# Patient Record
Sex: Female | Born: 1955
Health system: Southern US, Community
[De-identification: ages and names within clinical notes are randomized; demographics above are authoritative.]

## PROBLEM LIST (undated history)

## (undated) DIAGNOSIS — K760 Fatty (change of) liver, not elsewhere classified: Secondary | ICD-10-CM

## (undated) DIAGNOSIS — Z9981 Dependence on supplemental oxygen: Secondary | ICD-10-CM

## (undated) DIAGNOSIS — M797 Fibromyalgia: Secondary | ICD-10-CM

## (undated) DIAGNOSIS — E119 Type 2 diabetes mellitus without complications: Secondary | ICD-10-CM

## (undated) DIAGNOSIS — M1990 Unspecified osteoarthritis, unspecified site: Secondary | ICD-10-CM

## (undated) DIAGNOSIS — E039 Hypothyroidism, unspecified: Secondary | ICD-10-CM

## (undated) DIAGNOSIS — T8859XA Other complications of anesthesia, initial encounter: Secondary | ICD-10-CM

## (undated) DIAGNOSIS — I6521 Occlusion and stenosis of right carotid artery: Secondary | ICD-10-CM

## (undated) DIAGNOSIS — R0602 Shortness of breath: Secondary | ICD-10-CM

## (undated) DIAGNOSIS — J449 Chronic obstructive pulmonary disease, unspecified: Secondary | ICD-10-CM

## (undated) DIAGNOSIS — K219 Gastro-esophageal reflux disease without esophagitis: Secondary | ICD-10-CM

## (undated) DIAGNOSIS — I219 Acute myocardial infarction, unspecified: Secondary | ICD-10-CM

## (undated) DIAGNOSIS — Z8489 Family history of other specified conditions: Secondary | ICD-10-CM

## (undated) DIAGNOSIS — E785 Hyperlipidemia, unspecified: Secondary | ICD-10-CM

## (undated) DIAGNOSIS — I2510 Atherosclerotic heart disease of native coronary artery without angina pectoris: Secondary | ICD-10-CM

## (undated) DIAGNOSIS — J189 Pneumonia, unspecified organism: Secondary | ICD-10-CM

## (undated) DIAGNOSIS — H269 Unspecified cataract: Secondary | ICD-10-CM

## (undated) DIAGNOSIS — J45909 Unspecified asthma, uncomplicated: Secondary | ICD-10-CM

## (undated) DIAGNOSIS — S0300XA Dislocation of jaw, unspecified side, initial encounter: Secondary | ICD-10-CM

## (undated) DIAGNOSIS — T4145XA Adverse effect of unspecified anesthetic, initial encounter: Secondary | ICD-10-CM

## (undated) DIAGNOSIS — F419 Anxiety disorder, unspecified: Secondary | ICD-10-CM

## (undated) HISTORY — PX: BREAST SURGERY: SHX581

## (undated) HISTORY — PX: CHOLECYSTECTOMY: SHX55

## (undated) HISTORY — PX: TUBAL LIGATION: SHX77

## (undated) HISTORY — DX: Type 2 diabetes mellitus without complications: E11.9

## (undated) HISTORY — DX: Hypothyroidism, unspecified: E03.9

## (undated) HISTORY — DX: Gastro-esophageal reflux disease without esophagitis: K21.9

## (undated) HISTORY — PX: CARDIAC CATHETERIZATION: SHX172

## (undated) HISTORY — PX: ABDOMINAL HYSTERECTOMY: SUR658

## (undated) HISTORY — PX: DILATION AND CURETTAGE OF UTERUS: SHX78

---
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
## 2004-02-17 ENCOUNTER — Ambulatory Visit (HOSPITAL_COMMUNITY): Admission: RE | Admit: 2004-02-17 | Discharge: 2004-02-17 | Payer: Self-pay | Admitting: Family Medicine

## 2004-09-27 ENCOUNTER — Ambulatory Visit (HOSPITAL_COMMUNITY): Admission: RE | Admit: 2004-09-27 | Discharge: 2004-09-27 | Payer: Self-pay | Admitting: Family Medicine

## 2005-11-04 ENCOUNTER — Ambulatory Visit (HOSPITAL_COMMUNITY): Admission: RE | Admit: 2005-11-04 | Discharge: 2005-11-04 | Payer: Self-pay | Admitting: Family Medicine

## 2006-07-27 ENCOUNTER — Ambulatory Visit (HOSPITAL_COMMUNITY): Admission: RE | Admit: 2006-07-27 | Discharge: 2006-07-27 | Payer: Self-pay | Admitting: Family Medicine

## 2006-12-24 ENCOUNTER — Inpatient Hospital Stay (HOSPITAL_COMMUNITY): Admission: EM | Admit: 2006-12-24 | Discharge: 2006-12-25 | Payer: Self-pay | Admitting: Cardiovascular Disease

## 2007-10-14 ENCOUNTER — Emergency Department (HOSPITAL_COMMUNITY): Admission: EM | Admit: 2007-10-14 | Discharge: 2007-10-14 | Payer: Self-pay | Admitting: Emergency Medicine

## 2008-02-12 ENCOUNTER — Ambulatory Visit (HOSPITAL_COMMUNITY): Admission: RE | Admit: 2008-02-12 | Discharge: 2008-02-12 | Payer: Self-pay | Admitting: Family Medicine

## 2008-04-09 ENCOUNTER — Ambulatory Visit (HOSPITAL_COMMUNITY): Admission: RE | Admit: 2008-04-09 | Discharge: 2008-04-09 | Payer: Self-pay | Admitting: Family Medicine

## 2008-12-26 ENCOUNTER — Ambulatory Visit (HOSPITAL_COMMUNITY): Admission: RE | Admit: 2008-12-26 | Discharge: 2008-12-26 | Payer: Self-pay | Admitting: Family Medicine

## 2009-09-02 ENCOUNTER — Ambulatory Visit (HOSPITAL_COMMUNITY): Admission: RE | Admit: 2009-09-02 | Discharge: 2009-09-02 | Payer: Self-pay | Admitting: Ophthalmology

## 2009-10-01 ENCOUNTER — Ambulatory Visit (HOSPITAL_COMMUNITY): Admission: RE | Admit: 2009-10-01 | Discharge: 2009-10-01 | Payer: Self-pay | Admitting: Ophthalmology

## 2010-01-08 ENCOUNTER — Ambulatory Visit (HOSPITAL_COMMUNITY): Admission: RE | Admit: 2010-01-08 | Discharge: 2010-01-08 | Payer: Self-pay | Admitting: Family Medicine

## 2010-02-03 ENCOUNTER — Ambulatory Visit (HOSPITAL_COMMUNITY)
Admission: RE | Admit: 2010-02-03 | Discharge: 2010-02-03 | Payer: Self-pay | Source: Home / Self Care | Admitting: Obstetrics & Gynecology

## 2010-11-11 ENCOUNTER — Ambulatory Visit (HOSPITAL_COMMUNITY)
Admission: RE | Admit: 2010-11-11 | Discharge: 2010-11-11 | Payer: Self-pay | Source: Home / Self Care | Attending: Family Medicine | Admitting: Family Medicine

## 2010-11-11 ENCOUNTER — Encounter: Payer: Self-pay | Admitting: Orthopedic Surgery

## 2010-11-21 ENCOUNTER — Encounter: Payer: Self-pay | Admitting: Family Medicine

## 2010-11-24 ENCOUNTER — Encounter: Payer: Self-pay | Admitting: Orthopedic Surgery

## 2010-11-24 ENCOUNTER — Ambulatory Visit
Admission: RE | Admit: 2010-11-24 | Discharge: 2010-11-24 | Payer: Self-pay | Source: Home / Self Care | Attending: Orthopedic Surgery | Admitting: Orthopedic Surgery

## 2010-11-24 DIAGNOSIS — M722 Plantar fascial fibromatosis: Secondary | ICD-10-CM | POA: Insufficient documentation

## 2010-11-25 ENCOUNTER — Encounter: Payer: Self-pay | Admitting: Orthopedic Surgery

## 2010-12-02 NOTE — Medication Information (Signed)
Summary: Tax adviser   Imported By: Cammie Sickle 11/24/2010 20:53:44  _____________________________________________________________________  External Attachment:    Type:   Image     Comment:   External Document

## 2010-12-02 NOTE — Medication Information (Signed)
Summary: RX prior authorization required  RX prior authorization required   Imported By: Jacklynn Ganong 11/26/2010 12:50:01  _____________________________________________________________________  External Attachment:    Type:   Image     Comment:   External Document

## 2010-12-02 NOTE — Letter (Signed)
Summary: History form  History form   Imported By: Jacklynn Ganong 11/26/2010 13:17:45  _____________________________________________________________________  External Attachment:    Type:   Image     Comment:   External Document

## 2010-12-02 NOTE — Assessment & Plan Note (Signed)
Summary: EVAL/TREAT RT HEEL PAIN/XR [facility]/REF [facility]/[place]   Vital Signs:  Patient profile:   55 year old female Height:      68 inches Weight:      207 pounds Pulse rate:   76 / minute Resp:     16 per minute  Vitals Entered By: Fuller Canada MD (November 24, 2010 10:19 AM)  Visit Type:  new patient Referring Provider:  Dr. Renard Matter Primary Provider:  Dr. Renard Matter  CC:  right foot pain.  History of Present Illness: I saw Tabitha Schmidt in the office today for an initial visit.  She is a 55 years old woman with the complaint of:  right foot pain.  No injury.  Xrays [facility] 11/11/10 right foot.  Meds: Nexium, Thyroid med, Metformin, Celebrex, ASA.   She initially complained of plantar heel pain with pain getting out of a chair or standing up in the morning. However, she was treated with Celebrex and her pain has all but stopped.   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 Cardiovascular:  Denies chest pain, palpitations, fainting, and murmurs. Respiratory:  Complains of snoring; denies short of breath, wheezing, couch, tightness, pain on inspiration, and snoring . Gastrointestinal:  Complains of heartburn; denies nausea, vomiting, diarrhea, constipation, and blood in your stools. Genitourinary:  Denies frequency, urgency, difficulty urinating, painful urination, flank pain, and bleeding in urine. Neurologic:  Denies numbness, tingling, unsteady gait, dizziness, tremors,  and seizure. Musculoskeletal:  Complains of stiffness and muscle pain; denies joint pain, swelling, instability, redness, and heat. Endocrine:  Denies excessive thirst, exessive urination, and heat or cold intolerance. Psychiatric:  Denies nervousness, depression, anxiety, and hallucinations. Skin:  Denies changes in the skin, poor healing, rash, itching, and redness. HEENT:  Denies blurred or double vision, eye pain, redness, and watering. Immunology:  Complains of seasonal allergies; denies sinus problems and allergic to bee stings. Hemoatologic:  Denies easy bleeding and brusing.  Physical Exam  Additional Exam:   * VS reviewed and were normal   *GEN: appearance was normal   ** CDV: normal pulses temperature and no edema  * LYMPH nodes were normal   * SKIN was normal   * Neuro: normal sensation ** Psyche: AAO x 3 and mood was normal   MSK *Gait was normal  *Inspection of the RIGHT foot. There is no tenderness in the plantar aspect of the heel or the Achilles. Her range of motion remained normal. Gen. plantarflexion strength and ankle is stable    Impression & Recommendations:  Problem # 1:  PLANTAR FACIITIS (ICD-728.71) Assessment New  The x-rays were done at [facility]. The report and the films have been reviewed. very small plantar spur larger Achilles, calcification.   Orders: New Patient Level II (16109)  Medications Added to Medication List This Visit: 1)  Celebrex 200 Mg Caps (Celecoxib) .Marland Kitchen.. 1 q day  Patient Instructions: 1)  Continue Celebrex  2)  Please schedule a follow-up appointment as needed. Prescriptions: CELEBREX 200 MG CAPS (CELECOXIB) 1 q day  #30 x 1  Entered and Authorized by:   Fuller Canada MD   Signed by:   Fuller Canada MD on 11/24/2010   Method used:   Handwritten   RxID:   [id number]    Orders Added: 1)  New Patient Level II [14782]

## 2011-01-19 LAB — COMPREHENSIVE METABOLIC PANEL
ALT: 55 U/L — ABNORMAL HIGH (ref 0–35)
AST: 40 U/L — ABNORMAL HIGH (ref 0–37)
Albumin: 4.4 g/dL (ref 3.5–5.2)
Alkaline Phosphatase: 79 U/L (ref 39–117)
BUN: 11 mg/dL (ref 6–23)
CO2: 24 mEq/L (ref 19–32)
Calcium: 9.8 mg/dL (ref 8.4–10.5)
Chloride: 106 mEq/L (ref 96–112)
Creatinine, Ser: 0.76 mg/dL (ref 0.4–1.2)
GFR calc Af Amer: >60 mL/min (ref >60)
GFR calc non Af Amer: >60 mL/min (ref >60)
Glucose, Bld: 145 mg/dL — ABNORMAL HIGH (ref 70–99)
Potassium: 3.4 mEq/L — ABNORMAL LOW (ref 3.5–5.1)
Sodium: 138 mEq/L (ref 135–145)
Total Bilirubin: 0.8 mg/dL (ref 0.3–1.2)
Total Protein: 7.3 g/dL (ref 6.0–8.3)

## 2011-01-19 LAB — CBC
HCT: 41.7 % (ref 36.0–46.0)
Hemoglobin: 14.6 g/dL (ref 12.0–15.0)
MCHC: 35 g/dL (ref 30.0–36.0)
MCV: 91 fL (ref 78.0–100.0)
Platelets: 250 10*3/uL (ref 150–400)
RBC: 4.58 MIL/uL (ref 3.87–5.11)
RDW: 12.7 % (ref 11.5–15.5)
WBC: 5.1 10*3/uL (ref 4.0–10.5)

## 2011-01-19 LAB — URINALYSIS, ROUTINE W REFLEX MICROSCOPIC
Bilirubin Urine: NEGATIVE
Glucose, UA: NEGATIVE mg/dL
Hgb urine dipstick: NEGATIVE
Ketones, ur: NEGATIVE mg/dL
Nitrite: NEGATIVE
Protein, ur: NEGATIVE mg/dL
Specific Gravity, Urine: 1.005 (ref 1.005–1.030)
Urobilinogen, UA: 0.2 mg/dL (ref 0.0–1.0)
pH: 5.5 (ref 5.0–8.0)

## 2011-02-02 LAB — GLUCOSE, CAPILLARY: Glucose-Capillary: 91 mg/dL (ref 70–99)

## 2011-02-03 LAB — BASIC METABOLIC PANEL WITH GFR
BUN: 10 mg/dL (ref 6–23)
CO2: 29 meq/L (ref 19–32)
Calcium: 9.7 mg/dL (ref 8.4–10.5)
Chloride: 101 meq/L (ref 96–112)
Creatinine, Ser: 0.76 mg/dL (ref 0.4–1.2)
GFR calc Af Amer: >60 mL/min (ref >60)
GFR calc non Af Amer: >60 mL/min (ref >60)
Glucose, Bld: 152 mg/dL — ABNORMAL HIGH (ref 70–99)
Potassium: 3.7 meq/L (ref 3.5–5.1)
Sodium: 138 meq/L (ref 135–145)

## 2011-02-03 LAB — HEMOGLOBIN AND HEMATOCRIT, BLOOD
HCT: 42 % (ref 36.0–46.0)
Hemoglobin: 14.4 g/dL (ref 12.0–15.0)

## 2011-03-17 ENCOUNTER — Ambulatory Visit (INDEPENDENT_AMBULATORY_CARE_PROVIDER_SITE_OTHER): Payer: Medicaid Other | Admitting: Orthopedic Surgery

## 2011-03-17 DIAGNOSIS — M722 Plantar fascial fibromatosis: Secondary | ICD-10-CM

## 2011-03-17 MED ORDER — TRAMADOL-ACETAMINOPHEN 37.5-325 MG PO TABS: 1.0000 | ORAL_TABLET | ORAL | Status: DC | PRN

## 2011-03-17 MED ORDER — METHYLPREDNISOLONE ACETATE 40 MG/ML IJ SUSP: 40.0000 mg | Freq: Once | INTRAMUSCULAR | Status: DC

## 2011-03-17 MED ORDER — DICLOFENAC SODIUM 75 MG PO TBEC: 75.0000 mg | DELAYED_RELEASE_TABLET | Freq: Two times a day (BID) | ORAL | Status: DC

## 2011-03-17 NOTE — Patient Instructions (Addendum)
The patient is advised to apply ice or cold packs intermittently as needed to relieve pain.   You have received a steroid shot. 15% of patients experience increased pain at the injection site with in the next 24 hours. This is best treated with ice and tylenol extra strength 2 tabs every 8 hours. If you are still having pain please call the office.

## 2011-03-18 ENCOUNTER — Encounter: Payer: Self-pay | Admitting: Orthopedic Surgery

## 2011-03-18 NOTE — Progress Notes (Signed)
History reviewed. No pertinent family history. cancer, DM, CAD, Asthma  Previously seen for problems with her foot now presents back with continued problems in the plantar fascia associated with early step pain for start pain pain unrelieved by routine measures of anti-inflammatory ice and stretchingHer previous chart has been reviewed in the past medical social and surgical histories have been reviewed and updated her no changes  General: The patient is normally developed, with normal grooming and hygiene. There are no gross deformities. The body habitus is normal   CDV: The pulse and perfusion of the extremities are normal   LYMPH: There is no gross lymphadenopathy in the extremities    Skin: There are no rashes, ulcers or cafe-au-lait spot   Psyche: The patient is alert, awake and oriented.  Mood is normal   Neuro:  The coordination and balance are normal.  Sensation is normal. Reflexes are 2+ and equal   Musculoskeletal  Pain at the center aspect of the plantar fascia insertion at the calcaneus.  There is no tenderness in the Achilles tendon, no nodularity.  Range of motion in the ankle joint is normal.  Mid fascial areas are nontender.  There is metatarsal head tenderness  Recommend injection into the plantar fascia, start night splinting, physical therapy and continue anti-inflammatory medication.  I made sure  the patient is aware that this will take several months to get better.

## 2011-03-18 NOTE — Cardiovascular Report (Signed)
NAME:  ALANEY, WITTER NO.:  [id number]   MEDICAL RECORD NO.:  [id number]          PATIENT TYPE:  INP   LOCATION:  2854                         FACILITY:  [facility]   PHYSICIAN:  Nanetta Batty, M.D.   DATE OF BIRTH:  12-Aug-1956   DATE OF PROCEDURE:  DATE OF DISCHARGE:                            CARDIAC CATHETERIZATION   Ms. Owen the 55 year old married white female, transferred from [facility] because of chest pain.  She has a positive family history  for heart disease and smokes one pack per day, cigarettes.  She arrived  for a myocardial infarction.  She presents now for diagnostic coronary  arteriography to define her anatomy, rule out ischemic etiology.   DESCRIPTION OF PROCEDURE:  The patient brought to the second floor [facility]  cardiac cath lab in a post-absorptive state.  She is pre-medicated with  p.o. Valium.  The right groin was prepped and shaved the usual sterile fashion, one  percent Xylocaine was used with local anesthesia.  A 6-French sheath was  inserted into the right femoral artery using standard Seldinger  technique.  A 6-French right and left Judkins diagnostic catheter, as  well as a 6-French pigtail catheter were used for selective coronary  angiography, left ventriculography, distal abdominal aortography and  selective left renal artery angiography.  Visipaque dye was used to the  entirety of the case.  Retrograde aortic, left ventricular back blood  pressures were recorded.   HEMODYNAMIC RESULTS:  1. Aortic systolic pressure 127, diastolic pressure 64.  2. Left ventricular systolic pressure 127, end-diastolic pressure 14.   SELECTIVE CORONARY ANGIOGRAPHY:  1. Left main normal.  2. LAD:  LAD gave off a large first diagonal branch which had a 60%      segmental proximal stenosis.  3. Left circumflex; non-dominant and free of significant disease.  4. Right coronary artery; dominant and free of significant disease.  5. Left  ventriculography; RAO left ventriculogram performed using 25      mL of Visipaque dye at 12 mL per second.  The overall LVEF was      estimated greater than 60% without focal wall motion abnormalities.  6. Distal abdominal aortography; performed using 20 mL of Visipaque      dye at 20 mL per second.  There appear to be a ostial left renal      artery stenosis.  Infrarenal abdominal aorta and iliac bifurcation      appear free of significant atherosclerotic changes.   IMPRESSION:  Ms. Michaelis has branch vessel disease in a large diagonal  branch with normal LV function.  While this may be a culprit, she has  only had one episode of chest pain.  I prefer to treat her medically at  this time with risk factor modification, anti-anginal lesions.  Will  obtain an outpatient Myoview stress test at our office, after which time  I will see her back in followup.   Sheath was removed and pressure was held to the groin, to achieve  hemostasis.  The patient left the lab in stable condition.  Nanetta Batty, M.D.  Electronically Signed     JB/MEDQ  D:  12/25/2006  T:  12/25/2006  Job:  [id number]   cc:   Patient Chart  [facility] -2nd Floor  [facility]  [facility]

## 2011-03-18 NOTE — Discharge Summary (Signed)
Tabitha Schmidt, Tabitha Schmidt                 ACCOUNT NO.:  [id number]   MEDICAL RECORD NO.:  [id number]          PATIENT TYPE:  INP   LOCATION:  2854                         FACILITY:  [facility]   PHYSICIAN:  Nanetta Batty, M.D.   DATE OF BIRTH:  1955/12/21   DATE OF ADMISSION:  12/24/2006  DATE OF DISCHARGE:  12/25/2006                               DISCHARGE SUMMARY   DISCHARGE DIAGNOSES:  1. Unstable angina.  2. Coronary disease with a 60% diagonal at catheterization this      admission to be treated medically.  3. History of severe gastroesophageal reflux.  4. A 60% left renal artery stenosis with a 25-mm gradient at      catheterization this admission.  5. History of smoking.  6. Hypothyroidism, treated.  7. Dyslipidemia with an LDL of 133.   HOSPITAL COURSE:  The patient is a 55 year old female who was  transferred by [facility] with unstable angina.  She had  midsternal chest pain that radiated to her neck.  Initially, she thought  it was from reflux.  Nitroglycerin seemed to help.  The patient is on  b.i.d. Nexium at home already.  The patient was transferred to [facility] for further evaluation.  Enzymes were negative.  She was  put on heparin and nitrates.  Her Nexium was continued.  A D-dimer was  within normal limits.   She had catheterization done December 25, 2006, by Dr. Allyson Sabal, which  revealed normal RCA, normal left main, normal circumflex, normal OM,  normal LAD, and a 60% narrowing in the diagonal.  The iliac arteries  were normal, the left renal artery had a 60% narrowing with a 25-mm  gradient.  LV function was normal.   PLAN:  For outpatient Myoview and renal artery Doppler study and then a  followup with Dr. Allyson Sabal.   We did add simvastatin 40 mg and a baby aspirin to her medications.   In interviewing her further at discharge, it turns out she has a long  history of fairly severe gastroesophageal reflux disease.  Her symptoms  may have been from  that.   She will follow up with Dr. Karilyn Cota and Dr. Megan Mans.   LABORATORY:  INR is 1.  A chest x-ray showed no active disease.  D-dimer  is 0.45.  Troponin negative x3.  Cholesterol is 204 with an LDL of 133,  HDL 38, triglycerides 163.  Sodium 134, potassium 3.4, BUN 8, creatinine  0.6, white count 6, hemoglobin 14.4, hematocrit 41.9 platelets 235.  EKG  reveals sinus rhythm without acute changes.   DISPOSITION:  The patient is discharged in stable condition and will  follow up with Dr. Allyson Sabal in [place] after her Myoview and renal  artery Doppler studies.      Abelino Derrick, P.A.      Nanetta Batty, M.D.  Electronically Signed    LKK/MEDQ  D:  12/25/2006  T:  12/25/2006  Job:  [id number]   cc:   Angus G. Renard Matter, MD  Lionel December, M.D.

## 2011-04-14 ENCOUNTER — Ambulatory Visit (HOSPITAL_COMMUNITY): Payer: Medicaid Other | Admitting: Physical Therapy

## 2011-05-26 ENCOUNTER — Ambulatory Visit (INDEPENDENT_AMBULATORY_CARE_PROVIDER_SITE_OTHER): Payer: Medicaid Other | Admitting: Orthopedic Surgery

## 2011-05-26 ENCOUNTER — Encounter: Payer: Self-pay | Admitting: Orthopedic Surgery

## 2011-05-26 DIAGNOSIS — M722 Plantar fascial fibromatosis: Secondary | ICD-10-CM

## 2011-05-26 MED ORDER — ACETAMINOPHEN-CODEINE 300-30 MG PO TABS: 1.0000 | ORAL_TABLET | Freq: Four times a day (QID) | ORAL | Status: DC | PRN

## 2011-05-26 NOTE — Progress Notes (Signed)
   Followup visit  55 year old female with history of plantar fasciitis of the RIGHT foot presents with continued pain in the RIGHT foot.  She did not tolerate tramadol.  She is currently on diclofenac.  Previous treatment includes physical therapy, home exercise program, night splint, injection.  She is undergoing treatment for greater than 6 months and had pain for greater than one year in the plantar aspect of the foot.  She continues to complain of startup pain and pain getting out of bed in the morning.  Exam reveals a well-developed well-nourished female grooming and hygiene are intact she walks with a normal heel-to-toe gait pattern.  She has tenderness in the plantar aspect of the foot, in the calcaneus, and in the Achilles tendon.  Her range of motion is normal her strength is good her ankle is stable she has normal neurovascular exam  Impression plain radiographs taken in the past were normal  Plantar fasciitis rule out Achilles tendinitis and rule out stress fracture calcaneus  We ordered a Cam Walker for her.  When we get that we will place that for her as well.  She is to start Tylenol with Codeine for pain and we will do an MRI of her foot.

## 2011-05-26 NOTE — Patient Instructions (Signed)
Continue diclofenac Try codeine for pain as well if needed   Continue exercises and night splint   We will order the brace for $40  Order mri foot

## 2011-05-30 ENCOUNTER — Telehealth: Payer: Self-pay | Admitting: Radiology

## 2011-05-30 NOTE — Telephone Encounter (Signed)
error 

## 2011-06-06 ENCOUNTER — Telehealth: Payer: Self-pay | Admitting: Radiology

## 2011-06-06 NOTE — Telephone Encounter (Signed)
I called to give the patient her MRI appointment at [facility] on 06-07-11 at 9:30. Patient has Medicaid, authorization # [id number] and it expires on 06-29-11. She will follow up back here at our office for results.

## 2011-06-07 ENCOUNTER — Ambulatory Visit (HOSPITAL_COMMUNITY)
Admission: RE | Admit: 2011-06-07 | Discharge: 2011-06-07 | Disposition: A | Discharge status: Home / Self Care | Payer: Medicaid Other | Source: Ambulatory Visit | Attending: Orthopedic Surgery | Admitting: Orthopedic Surgery

## 2011-06-07 DIAGNOSIS — M25579 Pain in unspecified ankle and joints of unspecified foot: Secondary | ICD-10-CM | POA: Insufficient documentation

## 2011-06-07 DIAGNOSIS — M899 Disorder of bone, unspecified: Secondary | ICD-10-CM | POA: Insufficient documentation

## 2011-06-07 DIAGNOSIS — M722 Plantar fascial fibromatosis: Secondary | ICD-10-CM

## 2011-06-13 ENCOUNTER — Telehealth: Payer: Self-pay | Admitting: Orthopedic Surgery

## 2011-06-13 NOTE — Telephone Encounter (Signed)
Gave patient results she will continue with nonoperative care

## 2011-06-13 NOTE — Telephone Encounter (Signed)
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

## 2011-06-13 NOTE — Telephone Encounter (Signed)
Patient called, needs MRI results.  Phone # [phone].

## 2012-01-03 ENCOUNTER — Ambulatory Visit (INDEPENDENT_AMBULATORY_CARE_PROVIDER_SITE_OTHER): Payer: Medicaid Other | Admitting: Internal Medicine

## 2012-01-03 ENCOUNTER — Other Ambulatory Visit (INDEPENDENT_AMBULATORY_CARE_PROVIDER_SITE_OTHER): Payer: Self-pay | Admitting: *Deleted

## 2012-01-03 ENCOUNTER — Encounter (INDEPENDENT_AMBULATORY_CARE_PROVIDER_SITE_OTHER): Payer: Self-pay | Admitting: Internal Medicine

## 2012-01-03 ENCOUNTER — Encounter (INDEPENDENT_AMBULATORY_CARE_PROVIDER_SITE_OTHER): Payer: Self-pay | Admitting: *Deleted

## 2012-01-03 DIAGNOSIS — K219 Gastro-esophageal reflux disease without esophagitis: Secondary | ICD-10-CM

## 2012-01-03 DIAGNOSIS — E039 Hypothyroidism, unspecified: Secondary | ICD-10-CM

## 2012-01-03 DIAGNOSIS — E119 Type 2 diabetes mellitus without complications: Secondary | ICD-10-CM

## 2012-01-03 NOTE — Patient Instructions (Signed)
EGD

## 2012-01-03 NOTE — Progress Notes (Signed)
Subjective:     Patient ID: Tabitha Schmidt, female   DOB: 17-Aug-1956, 56 y.o.   MRN: [id number]  HPI Tabitha Schmidt is a 56 yr old female referred to our office by Dr. Renard Matter for GERD. She is avoiding greasy and fried foods. At night, her acid reflux burns her esophagus.  She bumped her Nexium up to twice a day with some relief.  She follows a GERD diet. She wakes frequently during the night due to t he acid reflux.  She feels like she is drowning. She feels like she has aspirated some of the acid reflux. She is coughing during the night.  She has frequent burning in her esophagus.  She started Carafate yesterday and it worked for about an hour. She also tells me she has wheezing and SOB, and cough.  Her appetite is good. No weight loss. She has a hx of GERD but symptoms have been worse in the last 3 weeks. No abdominal pain. She does have burning in her esophagus.  She has a BM every day. No melena or bright red rectal bleeding.  She has never undergone a creening colonoscopy.     Review of Systems see hpi     Current Outpatient Prescriptions  Medication Sig Dispense Refill  . Acetaminophen-Codeine (TYLENOL/CODEINE #3) 300-30 MG per tablet Take 1 tablet by mouth every 6 (six) hours as needed for pain.  42 tablet  0  . aspirin 81 MG EC tablet Take 81 mg by mouth as needed.       Marland Kitchen esomeprazole (NEXIUM) 40 MG capsule Take 40 mg by mouth 2 (two) times daily before a meal.       . estradiol (CLIMARA - DOSED IN MG/24 HR) 0.05 mg/24hr Place 1 patch onto the skin once a week.        . metFORMIN (GLUCOPHAGE) 500 MG tablet Take 1,000 mg by mouth 2 (two) times daily.       Marland Kitchen thyroid (ARMOUR) 90 MG tablet Take 90 mg by mouth daily.         Current Facility-Administered Medications  Medication Dose Route Frequency Provider Last Rate Last Dose  . DISCONTD: methylPREDNISolone acetate (DEPO-MEDROL) injection 40 mg  40 mg Intra-articular Once Fuller Canada, MD       Past Medical History  Diagnosis Date  .  Hypothyroid   . DM (diabetes mellitus)   . GERD (gastroesophageal reflux disease)    History   Social History  . Marital Status: Single    Spouse Name: N/A    Number of Children: N/A  . Years of Education: N/A   Occupational History  . Not on file.   Social History Main Topics  . Smoking status: Former Games developer  . Smokeless tobacco: Not on file   Comment: quit 5 yrs ago  . Alcohol Use: No  . Drug Use: No  . Sexually Active: Not on file   Other Topics Concern  . Not on file   Social History Narrative  . No narrative on file   Family Status  Relation Status Death Age  . Mother Deceased     Breast cancer  . Father Deceased     CAD  . Sister Alive     good health  . Brother Other     One deceased from ETOH, Two are alcoholics   No Known Allergies  Objective:   Physical Exam Filed Vitals:   01/03/12 1004  Height: 5\' 8"  (1.727 m)  Weight: 217 lb 6.4  oz (98.612 kg)  Alert and oriented. Skin warm and dry. Oral mucosa is moist.   . Sclera anicteric, conjunctivae is pink. Thyroid not enlarged. No cervical lymphadenopathy. Lungs clear. Heart regular rate and rhythm.  Abdomen is soft. Bowel sounds are positive. No hepatomegaly. No abdominal masses felt. No tenderness.  No edema to lower extremities. Patient is alert and oriented.       Assessment:    GERD uncontrolled at this time.  Esophagitis needs to be ruled out also.  In needs of a screening colonoscopy.    Plan:   EGD with Dr. Karilyn Cota. The risks and benefits such as perforation, bleeding, and infection were reviewed with the patient and is agreeable.     Patient has declined a colonoscopy

## 2012-01-17 ENCOUNTER — Encounter (HOSPITAL_COMMUNITY): Payer: Self-pay | Admitting: Pharmacy Technician

## 2012-01-25 ENCOUNTER — Other Ambulatory Visit: Payer: Self-pay | Admitting: *Deleted

## 2012-01-25 MED ORDER — DICLOFENAC-MISOPROSTOL 75-0.2 MG PO TABS: 1.0000 | ORAL_TABLET | Freq: Two times a day (BID) | ORAL | Status: AC

## 2012-01-25 MED ORDER — SODIUM CHLORIDE 0.45 % IV SOLN
Freq: Once | INTRAVENOUS | Status: AC
  Administered 2012-01-26: 08:00:00 via INTRAVENOUS

## 2012-01-26 ENCOUNTER — Ambulatory Visit (HOSPITAL_COMMUNITY)
Admission: RE | Admit: 2012-01-26 | Discharge: 2012-01-26 | Disposition: A | Discharge status: Home Health | Payer: Medicaid Other | Source: Ambulatory Visit | Attending: Internal Medicine | Admitting: Internal Medicine

## 2012-01-26 ENCOUNTER — Encounter (HOSPITAL_COMMUNITY): Payer: Self-pay

## 2012-01-26 ENCOUNTER — Encounter (HOSPITAL_COMMUNITY)
Admission: RE | Disposition: A | Discharge status: Home Health | Payer: Self-pay | Source: Ambulatory Visit | Attending: Internal Medicine

## 2012-01-26 DIAGNOSIS — K228 Other specified diseases of esophagus: Secondary | ICD-10-CM

## 2012-01-26 DIAGNOSIS — K449 Diaphragmatic hernia without obstruction or gangrene: Secondary | ICD-10-CM | POA: Insufficient documentation

## 2012-01-26 DIAGNOSIS — K219 Gastro-esophageal reflux disease without esophagitis: Secondary | ICD-10-CM

## 2012-01-26 DIAGNOSIS — Z01812 Encounter for preprocedural laboratory examination: Secondary | ICD-10-CM | POA: Insufficient documentation

## 2012-01-26 DIAGNOSIS — K2289 Other specified disease of esophagus: Secondary | ICD-10-CM

## 2012-01-26 DIAGNOSIS — E119 Type 2 diabetes mellitus without complications: Secondary | ICD-10-CM | POA: Insufficient documentation

## 2012-01-26 HISTORY — PX: ESOPHAGOGASTRODUODENOSCOPY: SHX5428

## 2012-01-26 HISTORY — DX: Anxiety disorder, unspecified: F41.9

## 2012-01-26 HISTORY — DX: Chronic obstructive pulmonary disease, unspecified: J44.9

## 2012-01-26 HISTORY — DX: Shortness of breath: R06.02

## 2012-01-26 HISTORY — DX: Unspecified cataract: H26.9

## 2012-01-26 LAB — GLUCOSE, CAPILLARY: Glucose-Capillary: 105 mg/dL — ABNORMAL HIGH (ref 70–99)

## 2012-01-26 SURGERY — EGD (ESOPHAGOGASTRODUODENOSCOPY)
Anesthesia: Moderate Sedation

## 2012-01-26 MED ORDER — MIDAZOLAM HCL 5 MG/5ML IJ SOLN
INTRAMUSCULAR | Status: DC | PRN
  Administered 2012-01-26 (×4): 2 mg via INTRAVENOUS

## 2012-01-26 MED ORDER — MIDAZOLAM HCL 5 MG/5ML IJ SOLN
INTRAMUSCULAR | Status: AC
  Filled 2012-01-26: qty 10

## 2012-01-26 MED ORDER — OMEPRAZOLE-SODIUM BICARBONATE 40-1100 MG PO CAPS: 1.0000 | ORAL_CAPSULE | Freq: Every day | ORAL | Status: DC

## 2012-01-26 MED ORDER — BUTAMBEN-TETRACAINE-BENZOCAINE 2-2-14 % EX AERO
INHALATION_SPRAY | CUTANEOUS | Status: DC | PRN
  Administered 2012-01-26: 2 via TOPICAL

## 2012-01-26 MED ORDER — STERILE WATER FOR IRRIGATION IR SOLN
Status: DC | PRN
  Administered 2012-01-26: 09:00:00

## 2012-01-26 MED ORDER — MEPERIDINE HCL 25 MG/ML IJ SOLN
INTRAMUSCULAR | Status: DC | PRN
  Administered 2012-01-26 (×2): 25 mg via INTRAVENOUS

## 2012-01-26 MED ORDER — MEPERIDINE HCL 50 MG/ML IJ SOLN
INTRAMUSCULAR | Status: AC
  Filled 2012-01-26: qty 1

## 2012-01-26 NOTE — H&P (Addendum)
This this is an updated history and physical on 01/03/2012. There has been no change in patient's medications and or symptoms. She is undergoing diagnostic EGD.

## 2012-01-26 NOTE — Discharge Instructions (Signed)
Anti-reflux measures as before. Discontinue Nexium and start Zegrid 40 mg by mouth 30 minutes before breakfast and at bedtime every day. No driving for 24 hours. Physician will contact you with results of biopsy.  Gastroesophageal Reflux Disease, Adult Gastroesophageal reflux disease (GERD) happens when acid from your stomach flows up into the esophagus. When acid comes in contact with the esophagus, the acid causes soreness (inflammation) in the esophagus. Over time, GERD may create small holes (ulcers) in the lining of the esophagus. CAUSES   Increased body weight. This puts pressure on the stomach, making acid rise from the stomach into the esophagus.   Smoking. This increases acid production in the stomach.   Drinking alcohol. This causes decreased pressure in the lower esophageal sphincter (valve or ring of muscle between the esophagus and stomach), allowing acid from the stomach into the esophagus.   Late evening meals and a full stomach. This increases pressure and acid production in the stomach.   A malformed lower esophageal sphincter.  Sometimes, no cause is found. SYMPTOMS   Burning pain in the lower part of the mid-chest behind the breastbone and in the mid-stomach area. This may occur twice a week or more often.   Trouble swallowing.   Sore throat.   Dry cough.   Asthma-like symptoms including chest tightness, shortness of breath, or wheezing.  DIAGNOSIS  Your caregiver may be able to diagnose GERD based on your symptoms. In some cases, X-rays and other tests may be done to check for complications or to check the condition of your stomach and esophagus. TREATMENT  Your caregiver may recommend over-the-counter or prescription medicines to help decrease acid production. Ask your caregiver before starting or adding any new medicines.  HOME CARE INSTRUCTIONS   Change the factors that you can control. Ask your caregiver for guidance concerning weight loss, quitting  smoking, and alcohol consumption.   Avoid foods and drinks that make your symptoms worse, such as:   Caffeine or alcoholic drinks.   Chocolate.   Peppermint or mint flavorings.   Garlic and onions.   Spicy foods.   Citrus fruits, such as oranges, lemons, or limes.   Tomato-based foods such as sauce, chili, salsa, and pizza.   Fried and fatty foods.   Avoid lying down for the 3 hours prior to your bedtime or prior to taking a nap.   Eat small, frequent meals instead of large meals.   Wear loose-fitting clothing. Do not wear anything tight around your waist that causes pressure on your stomach.   Raise the head of your bed 6 to 8 inches with wood blocks to help you sleep. Extra pillows will not help.   Only take over-the-counter or prescription medicines for pain, discomfort, or fever as directed by your caregiver.   Do not take aspirin, ibuprofen, or other nonsteroidal anti-inflammatory drugs (NSAIDs).  SEEK IMMEDIATE MEDICAL CARE IF:   You have pain in your arms, neck, jaw, teeth, or back.   Your pain increases or changes in intensity or duration.   You develop nausea, vomiting, or sweating (diaphoresis).   You develop shortness of breath, or you faint.   Your vomit is green, yellow, black, or looks like coffee grounds or blood.   Your stool is red, bloody, or black.  These symptoms could be signs of other problems, such as heart disease, gastric bleeding, or esophageal bleeding. MAKE SURE YOU:   Understand these instructions.   Will watch your condition.   Will get help right  away if you are not doing well or get worse.  Document Released: 07/27/2005 Document Revised: 10/06/2011 Document Reviewed: 05/06/2011 [facility] Patient Information 2012 [place], [place].  Endoscopy Care After Please read the instructions outlined below and refer to this sheet in the next few weeks. These discharge instructions provide you with general information on caring for yourself  after you leave the hospital. Your doctor may also give you specific instructions. While your treatment has been planned according to the most current medical practices available, unavoidable complications occasionally occur. If you have any problems or questions after discharge, please call your doctor. HOME CARE INSTRUCTIONS Activity  You may resume your regular activity but move at a slower pace for the next 24 hours.   Take frequent rest periods for the next 24 hours.   Walking will help expel (get rid of) the air and reduce the bloated feeling in your abdomen.   No driving for 24 hours (because of the anesthesia (medicine) used during the test).   You may shower.   Do not sign any important legal documents or operate any machinery for 24 hours (because of the anesthesia used during the test).  Nutrition  Drink plenty of fluids.   You may resume your normal diet.   Begin with a light meal and progress to your normal diet.   Avoid alcoholic beverages for 24 hours or as instructed by your caregiver.  Medications You may resume your normal medications unless your caregiver tells you otherwise. What you can expect today  You may experience abdominal discomfort such as a feeling of fullness or "gas" pains.   You may experience a sore throat for 2 to 3 days. This is normal. Gargling with salt water may help this.  Follow-up Your doctor will discuss the results of your test with you. SEEK IMMEDIATE MEDICAL CARE IF:  You have excessive nausea (feeling sick to your stomach) and/or vomiting.   You have severe abdominal pain and distention (swelling).   You have trouble swallowing.   You have a temperature over 100 F (37.8 C).   You have rectal bleeding or vomiting of blood.  Document Released: 05/31/2004 Document Revised: 10/06/2011 Document Reviewed: 12/12/2007 [facility] Patient Information 2012 [place], [place].

## 2012-01-26 NOTE — Op Note (Signed)
EGD PROCEDURE REPORT  PATIENT:  Tabitha Schmidt  MR#:  [id number] Birthdate:  1956/01/02, 56 y.o., female Endoscopist:  Dr. Malissa Hippo, MD Referred By:  Dr. Marcelline Mates, M.D. Procedure Date: 01/26/2012  Procedure:   EGD.  Indications:  Patient is a 56 year old Caucasian female ten-year history of GERD was then maintained in 40-80 mg of Nexium per day who is having refractory. She's having recurrent burning in her throat and nocturnal regurgitation. She is undergoing diagnostic EGD.            Informed Consent:  The risks, benefits, alternatives & imponderables which include, but are not limited to, bleeding, infection, perforation, drug reaction and potential missed lesion have been reviewed.  The potential for biopsy, lesion removal, esophageal dilation, etc. have also been discussed.  Questions have been answered.  All parties agreeable.  Please see history & physical in medical record for more information.  Medications:  Demerol 50 mg IV Versed 8 mg IV Cetacaine spray topically for oropharyngeal anesthesia  Description of procedure:  The endoscope was introduced through the mouth and advanced to the second portion of the duodenum without difficulty or limitations. The mucosal surfaces were surveyed very carefully during advancement of the scope and upon withdrawal.  Findings:  Esophagus:  Mucosa of the esophagus was normal. GE junction was unremarkable. GEJ:  38 cm Hiatus:  40 cm Stomach:  Stomach was empty and distended well with insufflation. Folds in the proximal stomach were normal. Examination of  mucosa at body revealed granularity or lumpy appearance. Antral mucosa was normal. Pyloric channel was patent. Angularis fundus and cardia was examined by retroflexing the scope and were normal. Duodenum:  Normal bulbar and post bulbar mucosa  Therapeutic/Diagnostic Maneuvers Performed:  Biopsy taken from gastric mucosa at body.  Complications:  None  Impression: Small sliding  hiatal hernia. No evidence of erosive esophagitis or Barrett's esophagus. Mucosal granularity at gastric body. Biopsy taken for routine histology.  Recommendations:  Discontinue Nexium. Start Zegrid 40 mg by mouth twice a day. Anti-reflux measures reinforced with emphasis on weight reduction. I will be contacting patient with the results of biopsy and further recommendations.  Tamira Ryland U  01/26/2012  9:04 AM  CC: Dr. Alice Reichert, MD, MD & Dr. Bonnetta Barry ref. provider found

## 2012-01-31 ENCOUNTER — Encounter (HOSPITAL_COMMUNITY): Payer: Self-pay | Admitting: Internal Medicine

## 2012-02-06 ENCOUNTER — Encounter (INDEPENDENT_AMBULATORY_CARE_PROVIDER_SITE_OTHER): Payer: Self-pay | Admitting: *Deleted

## 2012-02-09 ENCOUNTER — Telehealth (INDEPENDENT_AMBULATORY_CARE_PROVIDER_SITE_OTHER): Payer: Self-pay | Admitting: *Deleted

## 2012-02-09 NOTE — Telephone Encounter (Signed)
Patient called and states that after taking Omeprazole twice a day ,after talking to Dr. Karilyn Cota, she started having irregular heart beats. Stopped the Omeprazole Saturday and Monday the irregular heart beat stopped.  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 Indigestion 24/7. Another question that Tabitha Schmidt ask was , if I go back on the Nexium what good was the test , if there is nothing else I can do or take? She may be reached at [phone].

## 2012-02-15 ENCOUNTER — Other Ambulatory Visit (INDEPENDENT_AMBULATORY_CARE_PROVIDER_SITE_OTHER): Payer: Self-pay | Admitting: Internal Medicine

## 2012-02-15 DIAGNOSIS — K219 Gastro-esophageal reflux disease without esophagitis: Secondary | ICD-10-CM

## 2012-02-15 MED ORDER — ESOMEPRAZOLE MAGNESIUM 40 MG PO CPDR: 40.0000 mg | DELAYED_RELEASE_CAPSULE | Freq: Every day | ORAL | Status: DC

## 2012-03-20 ENCOUNTER — Ambulatory Visit (INDEPENDENT_AMBULATORY_CARE_PROVIDER_SITE_OTHER): Payer: Medicaid Other | Admitting: Internal Medicine

## 2012-03-20 ENCOUNTER — Encounter (INDEPENDENT_AMBULATORY_CARE_PROVIDER_SITE_OTHER): Payer: Self-pay | Admitting: Internal Medicine

## 2012-03-20 VITALS — BP 140/80 | HR 82 | Temp 97.8°F | Resp 20 | Ht 68.0 in | Wt 206.3 lb

## 2012-03-20 DIAGNOSIS — K219 Gastro-esophageal reflux disease without esophagitis: Secondary | ICD-10-CM

## 2012-03-20 NOTE — Progress Notes (Signed)
Presenting complaint;  Followup for chronic GERD.  Subjective:  Patient is 56 year old Caucasian female was evaluated 2 months ago for refractory GERD. She had been on Nexium and not responding to therapy. She underwent EGD on 01/26/2012. Esophageal  mucosa was normal. She had small sliding-type hernia. She had granularity t gastric mucosa and biopsy revealed parietal cell hyperplasia. She was begun on Zegrid which she stopped after 4 days because of palpitation and rapid heart beat. She has gone back to Nexium which she is taking daily or every other day. She has changed her eating habits completely. She also has lost 11 pounds in the last 10 weeks and she feels a lot better. Our goal is to get down to 160 pounds. She is hoping eventually she can come off the PPI. She denies heartburn or regurgitation hoarseness chronic cough sore throat or abdominal pain. She usually takes single dose of Arthrotec daily.  Current Medications: Current Outpatient Prescriptions  Medication Sig Dispense Refill  . Acetaminophen-Codeine 300-30 MG per tablet Take 1 tablet by mouth every 6 (six) hours as needed. For pain      . diclofenac-misoprostol (ARTHROTEC 75) 75-200 MG-MCG per tablet Take 1 tablet by mouth 2 (two) times daily.  60 tablet  2  . doxepin (SINEQUAN) 25 MG capsule Take 25 mg by mouth at bedtime.      Marland Kitchen esomeprazole (NEXIUM) 40 MG capsule Take 1 capsule (40 mg total) by mouth daily before supper.  30 capsule  11  . estradiol (CLIMARA - DOSED IN MG/24 HR) 0.05 mg/24hr Place 1 patch onto the skin once a week.        Marland Kitchen LORazepam (ATIVAN) 1 MG tablet Take 1 mg by mouth every 8 (eight) hours as needed.      . metFORMIN (GLUCOPHAGE) 500 MG tablet Take 1,000 mg by mouth 2 (two) times daily.       . sucralfate (CARAFATE) 1 GM/10ML suspension Take 1 g by mouth 4 (four) times daily.      Marland Kitchen thyroid (ARMOUR) 90 MG tablet Take 90 mg by mouth daily.        Marland Kitchen omeprazole-sodium bicarbonate (ZEGERID) 40-1100 MG per  capsule Take 1 capsule by mouth daily before breakfast.  60 capsule  5     Objective: Blood pressure 140/80, pulse 82, temperature 97.8 F (36.6 C), temperature source Oral, resp. rate 20, height 5\' 8"  (1.727 m), weight 206 lb 4.8 oz (93.577 kg). Conjunctiva is pink. Sclera is nonicteric Oropharyngeal mucosa is normal. No neck masses or thyromegaly noted. Abdomen is full but soft and nontender without organomegaly or masses.  No LE edema or clubbing noted.   Assessment:  Chronic GERD. Recent EGD was negative for erosive esophagitis or Barrett's esophagus. She could not tolerate Zegrid. Since she has lost weight and change her eating habits her symptoms are well controlled with Nexium.   Plan:  Can continue Nexium at 40 mg daily or every other day. If she does well will consider dropping the dose to 20 mg every other day after 6 months. She will call us with progress report at that time.

## 2012-03-20 NOTE — Patient Instructions (Signed)
Can drop Nexium to 40 mg every other day. If you do well on Nexium 40 mg every other day we'll consider dropping the dose to 20 mg every other day after 6 months or so.

## 2012-06-18 ENCOUNTER — Other Ambulatory Visit: Payer: Self-pay | Admitting: Family Medicine

## 2012-06-18 DIAGNOSIS — Z1231 Encounter for screening mammogram for malignant neoplasm of breast: Secondary | ICD-10-CM

## 2012-07-04 ENCOUNTER — Ambulatory Visit: Payer: Medicaid Other

## 2012-08-08 ENCOUNTER — Ambulatory Visit (HOSPITAL_COMMUNITY)
Admission: RE | Admit: 2012-08-08 | Discharge: 2012-08-08 | Disposition: A | Discharge status: Home / Self Care | Payer: Medicaid Other | Source: Ambulatory Visit | Attending: Family Medicine | Admitting: Family Medicine

## 2012-08-08 ENCOUNTER — Other Ambulatory Visit (HOSPITAL_COMMUNITY): Payer: Self-pay | Admitting: Family Medicine

## 2012-08-08 DIAGNOSIS — R0609 Other forms of dyspnea: Secondary | ICD-10-CM | POA: Insufficient documentation

## 2012-08-08 DIAGNOSIS — R0600 Dyspnea, unspecified: Secondary | ICD-10-CM

## 2012-08-08 DIAGNOSIS — R079 Chest pain, unspecified: Secondary | ICD-10-CM

## 2012-08-08 DIAGNOSIS — R0989 Other specified symptoms and signs involving the circulatory and respiratory systems: Secondary | ICD-10-CM | POA: Insufficient documentation

## 2012-08-08 DIAGNOSIS — I700 Atherosclerosis of aorta: Secondary | ICD-10-CM | POA: Insufficient documentation

## 2012-09-06 ENCOUNTER — Other Ambulatory Visit (HOSPITAL_COMMUNITY): Payer: Self-pay | Admitting: Family Medicine

## 2012-09-06 DIAGNOSIS — R0600 Dyspnea, unspecified: Secondary | ICD-10-CM

## 2012-09-06 DIAGNOSIS — R079 Chest pain, unspecified: Secondary | ICD-10-CM

## 2012-09-07 ENCOUNTER — Ambulatory Visit (HOSPITAL_COMMUNITY)
Admission: RE | Admit: 2012-09-07 | Discharge: 2012-09-07 | Disposition: A | Discharge status: Home / Self Care | Payer: Medicaid Other | Source: Ambulatory Visit | Attending: Family Medicine | Admitting: Family Medicine

## 2012-09-07 DIAGNOSIS — R911 Solitary pulmonary nodule: Secondary | ICD-10-CM | POA: Insufficient documentation

## 2012-09-07 DIAGNOSIS — R0609 Other forms of dyspnea: Secondary | ICD-10-CM | POA: Insufficient documentation

## 2012-09-07 DIAGNOSIS — R0600 Dyspnea, unspecified: Secondary | ICD-10-CM

## 2012-09-07 DIAGNOSIS — R079 Chest pain, unspecified: Secondary | ICD-10-CM

## 2012-09-07 DIAGNOSIS — R0989 Other specified symptoms and signs involving the circulatory and respiratory systems: Secondary | ICD-10-CM | POA: Insufficient documentation

## 2012-09-11 ENCOUNTER — Ambulatory Visit (HOSPITAL_COMMUNITY): Payer: Medicaid Other

## 2012-12-19 ENCOUNTER — Other Ambulatory Visit (INDEPENDENT_AMBULATORY_CARE_PROVIDER_SITE_OTHER): Payer: Self-pay | Admitting: Internal Medicine

## 2013-02-14 ENCOUNTER — Telehealth (INDEPENDENT_AMBULATORY_CARE_PROVIDER_SITE_OTHER): Payer: Self-pay | Admitting: *Deleted

## 2013-02-14 NOTE — Telephone Encounter (Signed)
We rec'd a prior request for Nexium 40 mg Take 1 by mouth daily #30 with 11 refills Per Solon Palm this has been approved for 1 year. PA # [id number] I forwarded this info to [facility]

## 2013-07-12 ENCOUNTER — Other Ambulatory Visit (INDEPENDENT_AMBULATORY_CARE_PROVIDER_SITE_OTHER): Payer: Self-pay | Admitting: Internal Medicine

## 2014-01-03 ENCOUNTER — Other Ambulatory Visit (INDEPENDENT_AMBULATORY_CARE_PROVIDER_SITE_OTHER): Payer: Self-pay | Admitting: Internal Medicine

## 2014-02-18 ENCOUNTER — Ambulatory Visit (HOSPITAL_COMMUNITY)
Admission: RE | Admit: 2014-02-18 | Discharge: 2014-02-18 | Disposition: A | Discharge status: Home / Self Care | Payer: Medicaid Other | Source: Ambulatory Visit | Attending: Family Medicine | Admitting: Family Medicine

## 2014-02-18 ENCOUNTER — Other Ambulatory Visit (HOSPITAL_COMMUNITY): Payer: Self-pay | Admitting: Family Medicine

## 2014-02-18 DIAGNOSIS — R05 Cough: Secondary | ICD-10-CM

## 2014-02-18 DIAGNOSIS — R058 Other specified cough: Secondary | ICD-10-CM

## 2014-02-18 DIAGNOSIS — R0989 Other specified symptoms and signs involving the circulatory and respiratory systems: Secondary | ICD-10-CM

## 2014-02-18 DIAGNOSIS — R059 Cough, unspecified: Secondary | ICD-10-CM | POA: Insufficient documentation

## 2014-02-18 DIAGNOSIS — R062 Wheezing: Secondary | ICD-10-CM

## 2014-03-11 ENCOUNTER — Telehealth (INDEPENDENT_AMBULATORY_CARE_PROVIDER_SITE_OTHER): Payer: Self-pay | Admitting: *Deleted

## 2014-03-11 NOTE — Telephone Encounter (Signed)
I call the patient's Insurance/Martha and a PA was approved from 03-11-14 /03-06-15. The PA number is [id number]. Patient was called and made aware.

## 2014-05-09 ENCOUNTER — Encounter (INDEPENDENT_AMBULATORY_CARE_PROVIDER_SITE_OTHER): Payer: Self-pay | Admitting: *Deleted

## 2014-05-13 ENCOUNTER — Other Ambulatory Visit (INDEPENDENT_AMBULATORY_CARE_PROVIDER_SITE_OTHER): Payer: Self-pay | Admitting: Internal Medicine

## 2014-05-13 NOTE — Telephone Encounter (Signed)
Per Dr.Rehman may fill with no refills. Patient needs to have an appointment.

## 2014-05-30 ENCOUNTER — Other Ambulatory Visit (INDEPENDENT_AMBULATORY_CARE_PROVIDER_SITE_OTHER): Payer: Self-pay | Admitting: Internal Medicine

## 2014-05-30 NOTE — Telephone Encounter (Signed)
Per Dr.Rehman may fill with 5 additional refills. 

## 2014-12-16 ENCOUNTER — Ambulatory Visit (INDEPENDENT_AMBULATORY_CARE_PROVIDER_SITE_OTHER): Payer: BLUE CROSS/BLUE SHIELD | Admitting: Internal Medicine

## 2014-12-16 ENCOUNTER — Encounter: Payer: Self-pay | Admitting: Internal Medicine

## 2014-12-16 VITALS — BP 124/80 | HR 92 | Temp 98.5°F | Resp 12 | Ht 67.5 in | Wt 201.8 lb

## 2014-12-16 DIAGNOSIS — E039 Hypothyroidism, unspecified: Secondary | ICD-10-CM

## 2014-12-16 DIAGNOSIS — R5382 Chronic fatigue, unspecified: Secondary | ICD-10-CM | POA: Insufficient documentation

## 2014-12-16 DIAGNOSIS — E119 Type 2 diabetes mellitus without complications: Secondary | ICD-10-CM

## 2014-12-16 LAB — COMPREHENSIVE METABOLIC PANEL
ALT: 22 U/L (ref 0–35)
AST: 22 U/L (ref 0–37)
Albumin: 4.4 g/dL (ref 3.5–5.2)
Alkaline Phosphatase: 89 U/L (ref 39–117)
BUN: 12 mg/dL (ref 6–23)
CO2: 31 mEq/L (ref 19–32)
Calcium: 9.8 mg/dL (ref 8.4–10.5)
Chloride: 105 mEq/L (ref 96–112)
Creatinine, Ser: 1.37 mg/dL — ABNORMAL HIGH (ref 0.40–1.20)
GFR: 41.66 mL/min — ABNORMAL LOW (ref >60.00)
Glucose, Bld: 88 mg/dL (ref 70–99)
Potassium: 4.2 mEq/L (ref 3.5–5.1)
Sodium: 139 mEq/L (ref 135–145)
Total Bilirubin: 0.4 mg/dL (ref 0.2–1.2)
Total Protein: 7.9 g/dL (ref 6.0–8.3)

## 2014-12-16 LAB — SEDIMENTATION RATE: Sed Rate: 29 mm/hr — ABNORMAL HIGH (ref 0–22)

## 2014-12-16 LAB — CBC
HCT: 46.2 % — ABNORMAL HIGH (ref 36.0–46.0)
Hemoglobin: 15.7 g/dL — ABNORMAL HIGH (ref 12.0–15.0)
MCHC: 34 g/dL (ref 30.0–36.0)
MCV: 89.1 fl (ref 78.0–100.0)
Platelets: 317 10*3/uL (ref 150.0–400.0)
RBC: 5.18 Mil/uL — ABNORMAL HIGH (ref 3.87–5.11)
RDW: 13.5 % (ref 11.5–15.5)
WBC: 7.5 10*3/uL (ref 4.0–10.5)

## 2014-12-16 LAB — VITAMIN B12: Vitamin B-12: 245 pg/mL (ref 211–911)

## 2014-12-16 LAB — VITAMIN D 25 HYDROXY (VIT D DEFICIENCY, FRACTURES): VITD: 19.89 ng/mL — ABNORMAL LOW (ref 30.00–100.00)

## 2014-12-16 LAB — T3, FREE: T3, Free: 2.1 pg/mL — ABNORMAL LOW (ref 2.3–4.2)

## 2014-12-16 LAB — FERRITIN: Ferritin: 23.9 ng/mL (ref 10.0–291.0)

## 2014-12-16 LAB — TSH: TSH: 34.67 u[IU]/mL — ABNORMAL HIGH (ref 0.35–4.50)

## 2014-12-16 LAB — T4, FREE: Free T4: 0.4 ng/dL — ABNORMAL LOW (ref 0.60–1.60)

## 2014-12-16 MED ORDER — WP THYROID 65 MG PO TABS: 65.0000 mg | ORAL_TABLET | Freq: Every day | ORAL | Status: DC

## 2014-12-16 NOTE — Patient Instructions (Signed)
Please stop at the lab.  Please come back for a follow-up appointment in 4 months.   

## 2014-12-16 NOTE — Progress Notes (Addendum)
Patient ID: Tabitha Schmidt, female   DOB: 12/08/1955, 59 y.o.   MRN: [id number]   HPI  Tabitha Schmidt is a 59 y.o.-year-old female, self-referred, for management of hypothyroidism and DM2..  Hypothyroidism: Pt. has been dx with hypothyroidism 2001; started on Levothyroxine, then Synthroid, but still felt tired, hair loss, anxiety >> now she is on Armour 90 mg every other day or less often to "stretch the pill" (equivalent to 150 mcg LT4 daily), taken: - fasting - with black coffee - separated by 60 min from b'fast  - no calcium, iron, PPIs, multivitamins  - if she takes PPI, she takes them with lunch or dinner  I reviewed pt's thyroid tests - per records from Dr Ronnald Collum: 05/19/2014: TSH 0.127, total T4 6.8 (4.5-12), free T4 0.81 (0.82-1.77), free T3 3.1 (2-4.4) TPO antibodies 12 (0-34), ATA <1 (0-0.9)  Reviewed thyroid ultrasound reports from Dr. Ronnald Collum - latest: 07/12/2013: Right lobe 1.58 x 0.86 x 2.19 cm; left lobe: 1.26 x 0.76 x 1.66 cm. Isthmus: 0.25 cm. The thyroid echogenicity is irregular. No nodules.  Pt describes: - + weight gain (weight loss per our chart) - + fatigue - + anxiety - + both heat and cold intolerance - + diarrhea/+ constipation - + dry skin - + hair loss  Pt denies feeling nodules in neck, but + hoarseness, occasional dysphagia/no odynophagia, no SOB with lying down, with exertion. She used to have asthma. Inhalers help.  She has + FH of thyroid disorders in: 2 sisters, mother. No FH of thyroid cancer.  No h/o radiation tx to head or neck. No recent use of iodine supplements.  DM2: - Diagnosed in 2013 - On metformin 1000 mg 2x a day >> ran out in 04/2014 - Well-controlled, last hemoglobin A1c 5.6% on 05/19/2014 - Meter: ReliOn - diet: gluten-free - started 1 mo ago  - Exercises by walking 3 times a week  - No CKD. Last BUN/creatinine on 05/19/2014: 14/0.62. At that time, her glucose was 93. - She has hyperlipidemia, no lipid levels available for  review. She is on simvastatin 20 mg daily  - ? neuropathy. Had numbness-tingling in her legs >> used gabapentin in the past. Tried Cymbalta >> it helped >> ran out.  - Latest eye exam: 2014 (Dr Gershon Crane). No DR. H/o cataracts. Had Laser sx for cataracts.  I reviewed her chart and she also has a history of hypertension, hyperlipidemia.  ROS: Constitutional: See history of present illness Eyes: no blurry vision, no xerophthalmia ENT: + sore throat, see history of present illness Cardiovascular: no CP/+ SOB/no palpitations/+ leg swelling Respiratory: no cough/+ SOB Gastrointestinal: no N/+ heartburn/ no V/+ D/+ C Musculoskeletal: + both: muscle/joint aches - Cymbalta helps Skin: no rashes Neurological: no tremors/numbness/tingling/dizziness Psychiatric: no depression/+ anxiety  Past Medical History  Diagnosis Date  . Hypothyroid   . DM (diabetes mellitus)   . GERD (gastroesophageal reflux disease)   . Cataracts, bilateral     Gershon Crane  . COPD (chronic obstructive pulmonary disease)   . Shortness of breath   . Anxiety    Past Surgical History  Procedure Laterality Date  . Tubal ligation    . Cholecystectomy    . Dilation and curettage of uterus    . Abdominal hysterectomy    . Breast surgery      removal of bilateral benign tumors  . Esophagogastroduodenoscopy  01/26/2012    Procedure: ESOPHAGOGASTRODUODENOSCOPY (EGD);  Surgeon: Rogene Houston, MD;  Location: [facility];  Service:  Endoscopy;  Laterality: N/A;  730   History   Social History  . Marital Status: Single    Spouse Name: N/A  . Number of Children: 3 + 2 adopted   Occupational History  . retired   Social History Main Topics  . Smoking status: Former Smoker -- 1.00 packs/day for 40 years    Types: Cigarettes    Quit date: 01/25/2005  . Smokeless tobacco: Not on file     Comment: quit 5 yrs ago  . Alcohol Use: No  . Drug Use: No  . Sexual Activity: Yes    Birth Control/ Protection: Surgical   Current  Outpatient Prescriptions on File Prior to Visit  Medication Sig Dispense Refill  . doxepin (SINEQUAN) 25 MG capsule Take 25 mg by mouth at bedtime.    Marland Kitchen esomeprazole (NEXIUM) 40 MG capsule TAKE ONE CAPSULE DAILY. 30 capsule 5  . estradiol (CLIMARA - DOSED IN MG/24 HR) 0.05 mg/24hr Place 1 patch onto the skin once a week.      Marland Kitchen LORazepam (ATIVAN) 1 MG tablet Take 1 mg by mouth every 8 (eight) hours as needed.    . metFORMIN (GLUCOPHAGE) 500 MG tablet Take 1,000 mg by mouth 2 (two) times daily.     . sucralfate (CARAFATE) 1 GM/10ML suspension Take 1 g by mouth 4 (four) times daily.    Marland Kitchen thyroid (ARMOUR) 90 MG tablet Take 90 mg by mouth daily.       No current facility-administered medications on file prior to visit.   Allergies  Allergen Reactions  . Zegerid [Omeprazole-Sodium Bicarbonate]     Irregular Heart Beat   Family History  Problem Relation Age of Onset  . Anesthesia problems Neg Hx   . Hypotension Neg Hx   . Malignant hyperthermia Neg Hx   . Pseudochol deficiency Neg Hx    PE: BP 124/80 mmHg  Pulse 92  Temp(Src) 98.5 F (36.9 C) (Oral)  Resp 12  Ht 5' 7.5" (1.715 m)  Wt 201 lb 12.8 oz (91.536 kg)  BMI 31.12 kg/m2  SpO2 95% Wt Readings from Last 3 Encounters:  12/16/14 201 lb 12.8 oz (91.536 kg)  03/20/12 206 lb 4.8 oz (93.577 kg)  01/26/12 217 lb (98.431 kg)   Constitutional: overweight, in NAD Eyes: PERRLA, EOMI, no exophthalmos ENT: moist mucous membranes, no thyromegaly, no cervical lymphadenopathy Cardiovascular: RRR, No MRG Respiratory: CTA B Gastrointestinal: abdomen soft, NT, ND, BS+ Musculoskeletal: no deformities, strength intact in all 4 Skin: moist, warm, no rashes Neurological: no tremor with outstretched hands, DTR normal in all 4  ASSESSMENT: 1. Hypothyroidism  2. Diabetes type 2, well controlled, without complications.  3. Fatigue  PLAN:  1. Patient with long-standing hypothyroidism, on Armour therapy. She appears euthyroid, but  complains of fatigue she has been rationing her Armour dose, taking an equivalent of 45 mg daily or less in the last few months. She does not appear to have a goiter, thyroid nodules, or neck compression symptoms - We discussed about correct intake of thyroid hh: fasting, with water, separated by at least 30 minutes from breakfast, and separated by more than 4 hours from calcium, iron, multivitamins, acid reflux medications (PPIs). - will check thyroid tests today: TSH, free T4, free T3 - If these are abnormal, she will need to return in 6-8 weeks for repeat labs - If these are normal, I will see her back in 4 months - She is interested to switch from Armour to [facility] thyroid extract  2. DM2 Patient with very well controlled diabetes versus prediabetes, previously on oral antidiabetic regimen with metformin target dose - last HbA1c was 5.6%. Since then, patient ran out of metformin and did not continue. She mentions episodes of hypoglycemia while taking the high dose of metformin. Since stopping metformin, she checked her sugars occasionally, and they were normal. She also did not have symptoms of hypoglycemia anymore.  - Will recheck her hemoglobin A1c today. - We discussed about options for treatment, and I suggested to stay off metformin for now until we get the results of the hemoglobin A1c back and also her CBG log - Strongly advised her to start checking sugars at different times of the day - check 1 time a day or every other day, rotating checks - given sugar log and advised how to fill it and to bring it at next appt  - given foot care handout and explained the principles  - given instructions for hypoglycemia management "15-15 rule"  - advised for yearly eye exams >> Needs one - Return to clinic in 4 mo with sugar log   3. Fatigue - She has been fatigued for a long while - She wants me to check "everything" to see what we can improve - I will check the following tests: Orders Placed This  Encounter  Procedures  . TSH  . T4, free  . T3, free  . CBC  . Comp Met (CMET)  . Ferritin  . Vitamin D (25 hydroxy)  . B12  . Sed Rate (ESR)   - time spent with the patient: 1 hour, of which >50% was spent in obtaining information about her symptoms, reviewing her previous labs, evaluations, and treatments, counseling her about her conditions (please see the discussed topics above), and developing a plan to further investigate them. She had a number of questions which I addressed.  Office Visit on 12/16/2014  Component Date Value Ref Range Status  . T3, Free 12/16/2014 2.1* 2.3 - 4.2 pg/mL Final  . WBC 12/16/2014 7.5  4.0 - 10.5 K/uL Final  . RBC 12/16/2014 5.18* 3.87 - 5.11 Mil/uL Final  . Platelets 12/16/2014 317.0  150.0 - 400.0 K/uL Final  . Hemoglobin 12/16/2014 15.7* 12.0 - 15.0 g/dL Final  . HCT 12/16/2014 46.2* 36.0 - 46.0 % Final  . MCV 12/16/2014 89.1  78.0 - 100.0 fl Final  . MCHC 12/16/2014 34.0  30.0 - 36.0 g/dL Final  . RDW 12/16/2014 13.5  11.5 - 15.5 % Final  . Sodium 12/16/2014 139  135 - 145 mEq/L Final  . Potassium 12/16/2014 4.2  3.5 - 5.1 mEq/L Final  . Chloride 12/16/2014 105  96 - 112 mEq/L Final  . CO2 12/16/2014 31  19 - 32 mEq/L Final  . Glucose, Bld 12/16/2014 88  70 - 99 mg/dL Final  . BUN 12/16/2014 12  6 - 23 mg/dL Final  . Creatinine, Ser 12/16/2014 1.37* 0.40 - 1.20 mg/dL Final  . Total Bilirubin 12/16/2014 0.4  0.2 - 1.2 mg/dL Final  . Alkaline Phosphatase 12/16/2014 89  39 - 117 U/L Final  . AST 12/16/2014 22  0 - 37 U/L Final  . ALT 12/16/2014 22  0 - 35 U/L Final  . Total Protein 12/16/2014 7.9  6.0 - 8.3 g/dL Final  . Albumin 12/16/2014 4.4  3.5 - 5.2 g/dL Final  . Calcium 12/16/2014 9.8  8.4 - 10.5 mg/dL Final  . GFR 12/16/2014 41.66* >60.00 mL/min Final  . Ferritin 12/16/2014 23.9  10.0 - 291.0 ng/mL Final  . VITD 12/16/2014 19.89* 30.00 - 100.00 ng/mL Final  . Vitamin B-12 12/16/2014 245  211 - 911 pg/mL Final  . Sed Rate 12/16/2014  29* 0 - 22 mm/hr Final  . TSH 12/16/2014 34.67* 0.35 - 4.50 uIU/mL Final  . Free T4 12/16/2014 0.40* 0.60 - 1.60 ng/dL Final   CBC with increased hemoglobin and RBC -? Smoking. Kidney function worse. AST and ALT normal. Ferritin normal. Vitamin D very low. Vitamin B12 very low. Sedimentation rate increased. TSH very high. I forgot to add a hemoglobin A1c to the above panel, will try to add it. I will addend the note when the hemoglobin A1c returns.  We will increase the dose of her thyroid hormone. We'll also switch to thyroid WBC 65 mg daily. We'll need a repeat set of thyroid labs in 6 weeks.  However, for the rest of the problems, will advise her to schedule an appointment with her PCP ASAP. She will likely need to be on high-dose vitamin D and injectable B12 vitamin. Also, she needs investigation for her high hemoglobin, high creatinine and high ESR. I will forward the results to PCP.   Called patient and explained the results and the plan above.   Lab Results  Component Value Date   HGBA1C 5.7 12/17/2014  HbA1c low >> no need to add back Metformin for now.

## 2014-12-17 ENCOUNTER — Other Ambulatory Visit (INDEPENDENT_AMBULATORY_CARE_PROVIDER_SITE_OTHER): Payer: BLUE CROSS/BLUE SHIELD

## 2014-12-17 DIAGNOSIS — E119 Type 2 diabetes mellitus without complications: Secondary | ICD-10-CM

## 2014-12-17 LAB — HEMOGLOBIN A1C: Hgb A1c MFr Bld: 5.7 % (ref 4.6–6.5)

## 2014-12-18 NOTE — Addendum Note (Signed)
Addended by: Carlus Pavlov on: 12/18/2014 07:55 AM   Modules accepted: Level of Service

## 2014-12-19 ENCOUNTER — Encounter: Payer: Self-pay | Admitting: Internal Medicine

## 2015-01-30 ENCOUNTER — Telehealth: Payer: Self-pay | Admitting: Internal Medicine

## 2015-01-30 ENCOUNTER — Other Ambulatory Visit (INDEPENDENT_AMBULATORY_CARE_PROVIDER_SITE_OTHER): Payer: BLUE CROSS/BLUE SHIELD

## 2015-01-30 DIAGNOSIS — E039 Hypothyroidism, unspecified: Secondary | ICD-10-CM

## 2015-01-30 LAB — T4, FREE: Free T4: 0.45 ng/dL — ABNORMAL LOW (ref 0.60–1.60)

## 2015-01-30 LAB — TSH: TSH: 21.29 u[IU]/mL — ABNORMAL HIGH (ref 0.35–4.50)

## 2015-01-30 LAB — T3, FREE: T3, Free: 2.3 pg/mL (ref 2.3–4.2)

## 2015-01-30 NOTE — Telephone Encounter (Signed)
Called pt and advised husband that Dr Elvera Lennox is out of town. We will call with results on Tues. Be advised.

## 2015-01-30 NOTE — Telephone Encounter (Signed)
Patient calling for the result of her lab work

## 2015-02-02 ENCOUNTER — Other Ambulatory Visit: Payer: Self-pay | Admitting: Internal Medicine

## 2015-02-02 DIAGNOSIS — E039 Hypothyroidism, unspecified: Secondary | ICD-10-CM

## 2015-02-02 MED ORDER — WP THYROID 81.25 MG PO TABS: ORAL_TABLET | ORAL | Status: DC

## 2015-02-02 NOTE — Telephone Encounter (Signed)
Sent results through MyChart

## 2015-02-03 ENCOUNTER — Telehealth: Payer: Self-pay | Admitting: *Deleted

## 2015-02-03 NOTE — Telephone Encounter (Signed)
Called pt and advised her that she can get the B12 inj here per Dr Elvera Lennox. Pt scheduled to have it tomorrow. Advised pt about having her labs done at next visit. Pt voiced understanding.

## 2015-02-03 NOTE — Telephone Encounter (Signed)
Called pt and lvm advising her that the results had been released through MyChart, that she can read them there or call me back for the results.

## 2015-02-03 NOTE — Telephone Encounter (Signed)
Pt called and she did get MyChart results. Pt said that she spoke with her PCP about starting the B12 injections back in Feb as Dr Elvera Lennox suggested. PCP stated to her that her values were not low enough to start B12 injections and her ins would not pay for them, to go and get B12 OTC. Pt said that is not working. Pt wants to know if she can get her B12 injections here? Pt states that she feels so tired, but cannot get any sleep. She did stop the hormone patch as Dr Elvera Lennox advised. Pt wants to know if she needs to have her levels checked since stopping the patch to see if this is part of the reason she feels so bad. Please advise.

## 2015-02-03 NOTE — Telephone Encounter (Signed)
She can have the B12 here, but I am not sure about the insurance issue.  Will recheck the TFTs at next visit, in 2 mo.

## 2015-02-04 ENCOUNTER — Ambulatory Visit (INDEPENDENT_AMBULATORY_CARE_PROVIDER_SITE_OTHER): Payer: BLUE CROSS/BLUE SHIELD | Admitting: *Deleted

## 2015-02-04 ENCOUNTER — Other Ambulatory Visit (INDEPENDENT_AMBULATORY_CARE_PROVIDER_SITE_OTHER): Payer: BLUE CROSS/BLUE SHIELD | Admitting: *Deleted

## 2015-02-04 DIAGNOSIS — E538 Deficiency of other specified B group vitamins: Secondary | ICD-10-CM

## 2015-02-04 MED ORDER — CYANOCOBALAMIN 1000 MCG/ML IJ SOLN
1000.0000 ug | Freq: Once | INTRAMUSCULAR | Status: AC
  Administered 2015-02-04: 1000 ug via INTRAMUSCULAR

## 2015-03-10 ENCOUNTER — Other Ambulatory Visit (INDEPENDENT_AMBULATORY_CARE_PROVIDER_SITE_OTHER): Payer: BLUE CROSS/BLUE SHIELD | Admitting: *Deleted

## 2015-03-10 ENCOUNTER — Ambulatory Visit (INDEPENDENT_AMBULATORY_CARE_PROVIDER_SITE_OTHER): Payer: BLUE CROSS/BLUE SHIELD | Admitting: *Deleted

## 2015-03-10 DIAGNOSIS — E538 Deficiency of other specified B group vitamins: Secondary | ICD-10-CM

## 2015-03-10 MED ORDER — CYANOCOBALAMIN 1000 MCG/ML IJ SOLN
1000.0000 ug | Freq: Once | INTRAMUSCULAR | Status: AC
  Administered 2015-03-10: 1000 ug via INTRAMUSCULAR

## 2015-04-10 ENCOUNTER — Other Ambulatory Visit (INDEPENDENT_AMBULATORY_CARE_PROVIDER_SITE_OTHER): Payer: BLUE CROSS/BLUE SHIELD | Admitting: *Deleted

## 2015-04-10 ENCOUNTER — Encounter: Payer: Self-pay | Admitting: Internal Medicine

## 2015-04-10 ENCOUNTER — Ambulatory Visit (INDEPENDENT_AMBULATORY_CARE_PROVIDER_SITE_OTHER): Payer: BLUE CROSS/BLUE SHIELD | Admitting: Internal Medicine

## 2015-04-10 VITALS — BP 112/64 | HR 92 | Temp 98.2°F | Resp 12 | Wt 195.8 lb

## 2015-04-10 DIAGNOSIS — E039 Hypothyroidism, unspecified: Secondary | ICD-10-CM | POA: Diagnosis not present

## 2015-04-10 DIAGNOSIS — E559 Vitamin D deficiency, unspecified: Secondary | ICD-10-CM

## 2015-04-10 DIAGNOSIS — E538 Deficiency of other specified B group vitamins: Secondary | ICD-10-CM

## 2015-04-10 DIAGNOSIS — E119 Type 2 diabetes mellitus without complications: Secondary | ICD-10-CM

## 2015-04-10 LAB — T4, FREE: Free T4: 0.47 ng/dL — ABNORMAL LOW (ref 0.60–1.60)

## 2015-04-10 LAB — TSH: TSH: 7.27 u[IU]/mL — ABNORMAL HIGH (ref 0.35–4.50)

## 2015-04-10 LAB — HEMOGLOBIN A1C: Hgb A1c MFr Bld: 5.3 % (ref 4.6–6.5)

## 2015-04-10 LAB — T3, FREE: T3, Free: 3 pg/mL (ref 2.3–4.2)

## 2015-04-10 MED ORDER — CYANOCOBALAMIN 1000 MCG/ML IJ SOLN
1000.0000 ug | Freq: Once | INTRAMUSCULAR | Status: AC
  Administered 2015-04-10: 1000 ug via INTRAMUSCULAR

## 2015-04-10 NOTE — Patient Instructions (Addendum)
Please stop at the lab.  Please come back for a follow-up appointment in 3 months. 3

## 2015-04-10 NOTE — Progress Notes (Signed)
Patient ID: Tabitha Schmidt, female   DOB: 1956-08-29, 59 y.o.   MRN: [id number]   HPI  Tabitha Schmidt is a 59 y.o.-year-old female, returning for f/u for hypothyroidism, DM2, vitamin B12 def., vitamin D def.. Last visit 4 mo ago.  Hypothyroidism: Pt. has been dx with hypothyroidism 2001; started on Levothyroxine, then Synthroid, but still felt tired, hair loss, anxiety >> then Armour 90 mg every other day or less often to "stretch the pill" (equivalent to 150 mcg LT4 daily), taken >> increased to WP 81.25 mg in 01/2015: - every day - fasting, at 4 am - separated by 60 min from b'fast  - no calcium, iron, PPIs, multivitamins  - stopped PPI- takes apple cider vinegar instead  I reviewed pt's thyroid tests: Lab Results  Component Value Date   TSH 21.29* 01/30/2015   TSH 34.67* 12/16/2014   FREET4 0.45* 01/30/2015   FREET4 0.40* 12/16/2014   - per records from Dr Patrecia Pace: 05/19/2014: TSH 0.127, total T4 6.8 (4.5-12), free T4 0.81 (0.82-1.77), free T3 3.1 (2-4.4) TPO antibodies 12 (0-34), ATA <1 (0-0.9)  Reviewed thyroid ultrasound reports from Dr. Patrecia Pace - latest: 07/12/2013: Right lobe 1.58 x 0.86 x 2.19 cm; left lobe: 1.26 x 0.76 x 1.66 cm. Isthmus: 0.25 cm. The thyroid echogenicity is irregular. No nodules.  Pt describes: - brain fog better - + weight loss - better fatigue - better anxiety  - + hot flushes - no diarrhea/+ constipation - + dry skin - + hair loss  Pt denies feeling nodules in neck, but + hoarseness, occasional dysphagia/no odynophagia, no SOB with lying down, with exertion. She used to have asthma. Inhalers help.  DM2: - Diagnosed in 2013 - On metformin 1000 mg 2x a day >> ran out in 04/2014 - Well-controlled, last hemoglobin A1c: Lab Results  Component Value Date   HGBA1C 5.7 12/17/2014  Previously: 5.6% on 05/19/2014 - Meter: ReliOn - sugars - checks 0-1x a day: 115-120 - diet: not gluten-free - Exercises by walking 3 times a week  - No CKD. Last  BUN/creatinine: Lab Results  Component Value Date   BUN 12 12/16/2014   CREATININE 1.37* 12/16/2014   - She has hyperlipidemia, no lipid levels available for review. She is on simvastatin 20 mg daily  - ? neuropathy. Had numbness-tingling in her legs >> used gabapentin in the past. Tried Cymbalta >> it helped >> ran out.  - Latest eye exam: 2014 (Dr Tabitha Schmidt). No DR. H/o cataracts. Had Laser sx for cataracts.  She also has a history of hypertension, hyperlipidemia.   She started to take a probiotic.   She has B12 vitamin deficiency >> started injections - x2 - here in the office >> feels much better.  She has Vitamin D def >> started D3 2000 units daily.   ROS: Constitutional: See history of present illness, + poor sleep Eyes: no blurry vision, no xerophthalmia ENT: + sore throat, see history of present illness Cardiovascular: no CP/+ SOB/no palpitations/+ leg swelling Respiratory: no cough/+ SOB Gastrointestinal: no N/+ heartburn (better now)/ no V/D/+ C Musculoskeletal: + both: muscle/joint aches Skin: no rashes, + hair loss Neurological: no tremors/numbness/tingling/dizziness  I reviewed pt's medications, allergies, PMH, social hx, family hx, and changes were documented in the history of present illness. Otherwise, unchanged from my initial visit note.  Past Medical History  Diagnosis Date  . Hypothyroid   . DM (diabetes mellitus)   . GERD (gastroesophageal reflux disease)   . Cataracts, bilateral  Tabitha Schmidt  . COPD (chronic obstructive pulmonary disease)   . Shortness of breath   . Anxiety    Past Surgical History  Procedure Laterality Date  . Tubal ligation    . Cholecystectomy    . Dilation and curettage of uterus    . Abdominal hysterectomy    . Breast surgery      removal of bilateral benign tumors  . Esophagogastroduodenoscopy  01/26/2012    Procedure: ESOPHAGOGASTRODUODENOSCOPY (EGD);  Surgeon: Malissa Hippo, MD;  Location: [facility];  Service:  Endoscopy;  Laterality: N/A;  730   History   Social History  . Marital Status: Single    Spouse Name: N/A  . Number of Children: 3 + 2 adopted   Occupational History  . retired   Social History Main Topics  . Smoking status: Former Smoker -- 1.00 packs/day for 40 years    Types: Cigarettes    Quit date: 01/25/2005  . Smokeless tobacco: Not on file     Comment: quit 5 yrs ago  . Alcohol Use: No  . Drug Use: No  . Sexual Activity: Yes    Birth Control/ Protection: Surgical   Current Outpatient Prescriptions on File Prior to Visit  Medication Sig Dispense Refill  . LORazepam (ATIVAN) 1 MG tablet Take 1 mg by mouth every 8 (eight) hours as needed.    . WP THYROID 81.25 MG TABS Take 1 tablet by mouth, in am, fasting 60 tablet 1  . doxepin (SINEQUAN) 25 MG capsule Take 25 mg by mouth at bedtime.    Marland Kitchen esomeprazole (NEXIUM) 40 MG capsule TAKE ONE CAPSULE DAILY. (Patient not taking: Reported on 12/16/2014) 30 capsule 5  . estradiol (CLIMARA - DOSED IN MG/24 HR) 0.05 mg/24hr Place 1 patch onto the skin once a week.      . metFORMIN (GLUCOPHAGE) 500 MG tablet Take 1,000 mg by mouth 2 (two) times daily.     . sucralfate (CARAFATE) 1 GM/10ML suspension Take 1 g by mouth 4 (four) times daily.     No current facility-administered medications on file prior to visit.   Allergies  Allergen Reactions  . Zegerid [Omeprazole-Sodium Bicarbonate]     Irregular Heart Beat   Family History  Problem Relation Age of Onset  . Anesthesia problems Neg Hx   . Hypotension Neg Hx   . Malignant hyperthermia Neg Hx   . Pseudochol deficiency Neg Hx    PE: BP 112/64 mmHg  Pulse 92  Temp(Src) 98.2 F (36.8 C) (Oral)  Resp 12  Wt 195 lb 12.8 oz (88.814 kg)  SpO2 95% Body mass index is 30.2 kg/(m^2). Wt Readings from Last 3 Encounters:  04/10/15 195 lb 12.8 oz (88.814 kg)  12/16/14 201 lb 12.8 oz (91.536 kg)  03/20/12 206 lb 4.8 oz (93.577 kg)   Constitutional: overweight, in NAD Eyes: PERRLA,  EOMI, no exophthalmos ENT: moist mucous membranes, no thyromegaly, no cervical lymphadenopathy Cardiovascular: RRR, No MRG Respiratory: CTA B Gastrointestinal: abdomen soft, NT, ND, BS+ Musculoskeletal: no deformities, strength intact in all 4 Skin: moist, warm, no rashes Neurological: no tremor with outstretched hands, DTR normal in all 4  ASSESSMENT: 1. Hypothyroidism  2. Diabetes type 2, well controlled, without complications.  3. Vit D def  4. Vit B12 def.  PLAN:  1. Patient with long-standing hypothyroidism, on [facility] therapy (81.25 mg). She appears euthyroid, but has various sxs including fatigue, which is better after improving her TSH and after her B12 inj's . She does  not appear to have a goiter, thyroid nodules, or neck compression symptoms - We discussed about correct intake of thyroid hh: fasting, with water, separated by at least 30 minutes from breakfast, and separated by more than 4 hours from calcium, iron, multivitamins, acid reflux medications (PPIs). She is taking it correctly. - will check thyroid tests today: TSH, free T4, free T3 - If these are abnormal, she will need to return in 6-8 weeks for repeat labs - If these are normal, I will see her back in 3 months  2. DM2 Patient with very well controlled diabetes versus prediabetes, previously on oral antidiabetic regimen with metformin target dose - last HbA1c was 5.7%. Since stopping metformin, she checks her sugars occasionally, and they were normal. No symptoms of hypoglycemia. - Will recheck her hemoglobin A1c today. - I suggested to stay off metformin for now  - Strongly advised her to start checking sugars at different times of the day - check 1 time a day or every other day, rotating checks - advised for yearly eye exams >> Needs one - Return to clinic in 3 mo with sugar log   3. Vitamin D def - on vit D 2000 IU daily - continue this dose - will check a new vit D level today  4. Vitamin B12 def - will  continue with monthly im injections x 6 mo - after this, continue po supplementation  - time spent with the patient: 40 min, of which >50% was spent in obtaining information about her symptoms, reviewing her previous labs, evaluations, and treatments, counseling her about her conditions (please see the discussed topics above), and developing a plan to further investigate them. She had a number of questions which I addressed.    Component     Latest Ref Rng 12/16/2014 12/17/2014 01/30/2015 04/10/2015  T3, Free     2.3 - 4.2 pg/mL 2.1 (L)  2.3 3.0  VITD     30.00 - 100.00 ng/mL 19.89 (L)   24.59 (L)  TSH     0.35 - 4.50 uIU/mL 34.67 (H)  21.29 (H) 7.27 (H)  Free T4     0.60 - 1.60 ng/dL 1.61 (L)  0.96 (L) 0.45 (L)  Hemoglobin A1C     4.6 - 6.5 %  5.7  5.3   All the tests above are improved! Vitamin D still on the low side, we'll increase her vitamin D dose to 4000 units daily. TSH is much better, but not quite in the normal range. I would like to recheck her thyroid test at next visit in 2 months and increase the dose of NP thyroid at that time, if necessary.  Hemoglobin A1c is great. Continue off metformin.

## 2015-04-13 LAB — VITAMIN D 25 HYDROXY (VIT D DEFICIENCY, FRACTURES): VITD: 24.59 ng/mL — ABNORMAL LOW (ref 30.00–100.00)

## 2015-04-21 ENCOUNTER — Telehealth: Payer: Self-pay

## 2015-04-21 NOTE — Telephone Encounter (Signed)
Thyroid and vitamin D levels are relatively low.  Not clear what kind of thyroid medication she is taking and will defer adjustment to Dr. Reece Agar

## 2015-04-21 NOTE — Telephone Encounter (Signed)
Left voicemail advising of note below. Requested call back if the pt would like to discuss.  

## 2015-04-21 NOTE — Telephone Encounter (Signed)
Pt called requesting blood test results. Could you review blood tests from 04/10/2015 for Dr. Elvera Lennox while she is out of the office.  Thanks!

## 2015-05-05 ENCOUNTER — Encounter: Payer: Self-pay | Admitting: Internal Medicine

## 2015-05-06 ENCOUNTER — Other Ambulatory Visit: Payer: Self-pay | Admitting: Internal Medicine

## 2015-05-06 DIAGNOSIS — E559 Vitamin D deficiency, unspecified: Secondary | ICD-10-CM

## 2015-05-06 DIAGNOSIS — E538 Deficiency of other specified B group vitamins: Secondary | ICD-10-CM

## 2015-05-06 DIAGNOSIS — E119 Type 2 diabetes mellitus without complications: Secondary | ICD-10-CM

## 2015-05-06 DIAGNOSIS — E039 Hypothyroidism, unspecified: Secondary | ICD-10-CM

## 2015-05-08 ENCOUNTER — Ambulatory Visit (INDEPENDENT_AMBULATORY_CARE_PROVIDER_SITE_OTHER): Payer: BLUE CROSS/BLUE SHIELD | Admitting: *Deleted

## 2015-05-08 ENCOUNTER — Other Ambulatory Visit (INDEPENDENT_AMBULATORY_CARE_PROVIDER_SITE_OTHER): Payer: BLUE CROSS/BLUE SHIELD | Admitting: *Deleted

## 2015-05-08 DIAGNOSIS — E538 Deficiency of other specified B group vitamins: Secondary | ICD-10-CM

## 2015-05-08 MED ORDER — CYANOCOBALAMIN 1000 MCG/ML IJ SOLN
1000.0000 ug | Freq: Once | INTRAMUSCULAR | Status: AC
  Administered 2015-05-08: 1000 ug via INTRAMUSCULAR

## 2015-05-26 ENCOUNTER — Other Ambulatory Visit: Payer: Self-pay

## 2015-05-26 MED ORDER — WP THYROID 81.25 MG PO TABS: ORAL_TABLET | ORAL | Status: DC

## 2015-06-09 ENCOUNTER — Ambulatory Visit (INDEPENDENT_AMBULATORY_CARE_PROVIDER_SITE_OTHER): Payer: BLUE CROSS/BLUE SHIELD | Admitting: *Deleted

## 2015-06-09 ENCOUNTER — Telehealth: Payer: Self-pay | Admitting: *Deleted

## 2015-06-09 ENCOUNTER — Other Ambulatory Visit (INDEPENDENT_AMBULATORY_CARE_PROVIDER_SITE_OTHER): Payer: BLUE CROSS/BLUE SHIELD | Admitting: *Deleted

## 2015-06-09 DIAGNOSIS — E039 Hypothyroidism, unspecified: Secondary | ICD-10-CM

## 2015-06-09 DIAGNOSIS — E538 Deficiency of other specified B group vitamins: Secondary | ICD-10-CM

## 2015-06-09 MED ORDER — CYANOCOBALAMIN 1000 MCG/ML IJ SOLN
1000.0000 ug | Freq: Once | INTRAMUSCULAR | Status: AC
  Administered 2015-06-09: 1000 ug via INTRAMUSCULAR

## 2015-06-09 MED ORDER — THYROID 97.5 MG PO TABS: 1.0000 | ORAL_TABLET | Freq: Every day | ORAL | Status: DC

## 2015-06-09 NOTE — Telephone Encounter (Signed)
Called pt and lvm to return my call °

## 2015-06-09 NOTE — Telephone Encounter (Signed)
OK to add back the 1/4th tablet and will check labs when she comes back.

## 2015-06-09 NOTE — Telephone Encounter (Signed)
Pt came in for B-12 inj. Pt wanted Dr Elvera Lennox to know how she is feeling (she also left papers for Dr Elvera Lennox to read). Pt is on 81.25 mg of WP Thyroid. Pt had labs done first of June. While waiting for the lab results (Dr Elvera Lennox was out of the country), pt added 1/4 of a tablet to the 81.25. Pt stated she felt much better, had very little fatigue. Pt got her lab results via MyChart and was advised to continue at the 81.25 mg. Pt stopped taking the extra 1/4 of a tablet and has felt really bad.  Pt would like for Dr Elvera Lennox to do something. 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 Please advise. (Papers are on your desk).

## 2015-06-09 NOTE — Telephone Encounter (Signed)
Pt returned call. We added the total of the 2 doses 81.25 and 16.25 = 97.5. Sent in a new rx for [facility] Thyroid for 97.5 mg to pt's pharmacy.

## 2015-07-07 ENCOUNTER — Other Ambulatory Visit (INDEPENDENT_AMBULATORY_CARE_PROVIDER_SITE_OTHER): Payer: BLUE CROSS/BLUE SHIELD

## 2015-07-07 DIAGNOSIS — E538 Deficiency of other specified B group vitamins: Secondary | ICD-10-CM

## 2015-07-07 DIAGNOSIS — E119 Type 2 diabetes mellitus without complications: Secondary | ICD-10-CM | POA: Diagnosis not present

## 2015-07-07 DIAGNOSIS — E039 Hypothyroidism, unspecified: Secondary | ICD-10-CM

## 2015-07-07 DIAGNOSIS — E559 Vitamin D deficiency, unspecified: Secondary | ICD-10-CM | POA: Diagnosis not present

## 2015-07-07 LAB — HEMOGLOBIN A1C: Hgb A1c MFr Bld: 5.5 % (ref 4.6–6.5)

## 2015-07-07 LAB — VITAMIN B12: Vitamin B-12: 943 pg/mL — ABNORMAL HIGH (ref 211–911)

## 2015-07-07 LAB — T3, FREE: T3, Free: 3.2 pg/mL (ref 2.3–4.2)

## 2015-07-07 LAB — TSH: TSH: 1.81 u[IU]/mL (ref 0.35–4.50)

## 2015-07-07 LAB — T4, FREE: Free T4: 0.71 ng/dL (ref 0.60–1.60)

## 2015-07-07 LAB — VITAMIN D 25 HYDROXY (VIT D DEFICIENCY, FRACTURES): VITD: 40.83 ng/mL (ref 30.00–100.00)

## 2015-07-10 ENCOUNTER — Encounter: Payer: Self-pay | Admitting: Internal Medicine

## 2015-07-10 ENCOUNTER — Ambulatory Visit (INDEPENDENT_AMBULATORY_CARE_PROVIDER_SITE_OTHER): Payer: BLUE CROSS/BLUE SHIELD | Admitting: Internal Medicine

## 2015-07-10 ENCOUNTER — Other Ambulatory Visit (INDEPENDENT_AMBULATORY_CARE_PROVIDER_SITE_OTHER): Payer: BLUE CROSS/BLUE SHIELD | Admitting: *Deleted

## 2015-07-10 VITALS — BP 140/80 | HR 95 | Temp 98.3°F | Resp 12 | Wt 186.4 lb

## 2015-07-10 DIAGNOSIS — E538 Deficiency of other specified B group vitamins: Secondary | ICD-10-CM

## 2015-07-10 DIAGNOSIS — R739 Hyperglycemia, unspecified: Secondary | ICD-10-CM

## 2015-07-10 DIAGNOSIS — E559 Vitamin D deficiency, unspecified: Secondary | ICD-10-CM | POA: Diagnosis not present

## 2015-07-10 DIAGNOSIS — E039 Hypothyroidism, unspecified: Secondary | ICD-10-CM | POA: Diagnosis not present

## 2015-07-10 MED ORDER — CYANOCOBALAMIN 1000 MCG/ML IJ SOLN
1000.0000 ug | Freq: Once | INTRAMUSCULAR | Status: AC
Start: 2015-07-10 — End: 2015-07-10
  Administered 2015-07-10: 1000 ug via INTRAMUSCULAR

## 2015-07-10 NOTE — Progress Notes (Signed)
Patient ID: Tabitha Schmidt, female   DOB: 25-Mar-1956, 59 y.o.   MRN: [id number]   HPI  Tabitha Schmidt is a 58 y.o.-year-old female, returning for f/u for hypothyroidism, DM2, vitamin B12 def., vitamin D def.. Last visit 4 mo ago.  She started a Gluten free diet >> feels better. Even before starting the diet she started to feel much better!  Hypothyroidism: Pt. has been dx with hypothyroidism 2001; started on Levothyroxine, then Synthroid, but still felt tired, hair loss, anxiety >> then Armour 90 mg every other day or less often to "stretch the pill" (equivalent to 150 mcg LT4 daily) >> then WP 81.25 >> now WP 97.5 mg: - every day - fasting, at 4 am, under tongue - separated by 60 min from b'fast  - no calcium, iron, multivitamins  - no PPIs  I reviewed pt's thyroid tests: Lab Results  Component Value Date   TSH 1.81 07/07/2015   TSH 7.27* 04/10/2015   TSH 21.29* 01/30/2015   TSH 34.67* 12/16/2014   FREET4 0.71 07/07/2015   FREET4 0.47* 04/10/2015   FREET4 0.45* 01/30/2015   FREET4 0.40* 12/16/2014   - per records from Dr Patrecia Pace: 05/19/2014: TSH 0.127, total T4 6.8 (4.5-12), free T4 0.81 (0.82-1.77), free T3 3.1 (2-4.4) TPO antibodies 12 (0-34), ATA <1 (0-0.9)  Reviewed thyroid ultrasound reports from Dr. Patrecia Pace - latest: 07/12/2013: Right lobe 1.58 x 0.86 x 2.19 cm; left lobe: 1.26 x 0.76 x 1.66 cm. Isthmus: 0.25 cm. The thyroid echogenicity is irregular. No nodules.  Pt describes: - + fatigue in the evening - + weight loss - diet - improved anxiety  - + feet burning - no diarrhea/+ constipation - + dry skin - + hair loss  Pt denies feeling nodules in neck, but + hoarseness, occasional dysphagia/no odynophagia, no SOB with lying down, with exertion. She used to have asthma. Inhalers help.  Hyperglycemia: - Dx in 2013 - On metformin 1000 mg 2x a day >> ran out in 04/2014 >> now off - Well-controlled, last hemoglobin A1c: Lab Results  Component Value Date   HGBA1C 5.5  07/07/2015   HGBA1C 5.3 04/10/2015   HGBA1C 5.7 12/17/2014  Previously: 5.6% on 05/19/2014 - Meter: ReliOn - diet: now gluten-free >> lost 15 lbs since 12/2014!!! - Exercises by walking 3 times a week  - No CKD. Last BUN/creatinine: Lab Results  Component Value Date   BUN 12 12/16/2014   CREATININE 1.37* 12/16/2014   - She has hyperlipidemia, no lipid levels available for review. She is on simvastatin 20 mg daily  - ? neuropathy. Had numbness-tingling in her legs >> used gabapentin in the past. Tried Cymbalta >> it helped >> ran out.  - Latest eye exam: 2016 (Dr [name]). No DR. H/o cataracts. Had Laser sx for cataracts.  She also has a history of hypertension, hyperlipidemia.   She has B12 vitamin deficiency >> started injections - here in the office >> feels much better. Latest level: Lab Results  Component Value Date   VITAMINB12 943* 07/07/2015   She has Vitamin D def >> started D3 2000 units daily, but then increased to 4000 IU daily:   Component     Latest Ref Rng 12/16/2014 04/10/2015 07/07/2015  VITD     30.00 - 100.00 ng/mL 19.89 (L) 24.59 (L) 40.83    ROS: Constitutional: See history of present illness, + poor sleep Eyes: no blurry vision, no xerophthalmia ENT: no sore throat, see history of present illness Cardiovascular: no  CP/SOB/no palpitations/+ leg swelling Respiratory: no cough/SOB Gastrointestinal: no N/heartburn/V/+ D/+ C Musculoskeletal: + both: muscle/joint aches Skin: no rashes, + hair loss Neurological: no tremors/numbness/tingling/dizziness  I reviewed pt's medications, allergies, PMH, social hx, family hx, and changes were documented in the history of present illness. Otherwise, unchanged from my initial visit note.  Past Medical History  Diagnosis Date  . Hypothyroid   . DM (diabetes mellitus)   . GERD (gastroesophageal reflux disease)   . Cataracts, bilateral     [name]  . COPD (chronic obstructive pulmonary disease)   . Shortness of  breath   . Anxiety    Past Surgical History  Procedure Laterality Date  . Tubal ligation    . Cholecystectomy    . Dilation and curettage of uterus    . Abdominal hysterectomy    . Breast surgery      removal of bilateral benign tumors  . Esophagogastroduodenoscopy  01/26/2012    Procedure: ESOPHAGOGASTRODUODENOSCOPY (EGD);  Surgeon: Malissa Hippo, MD;  Location: [facility];  Service: Endoscopy;  Laterality: N/A;  730   History   Social History  . Marital Status: Single    Spouse Name: N/A  . Number of Children: 3 + 2 adopted   Occupational History  . retired   Social History Main Topics  . Smoking status: Former Smoker -- 1.00 packs/day for 40 years    Types: Cigarettes    Quit date: 01/25/2005  . Smokeless tobacco: Not on file     Comment: quit 5 yrs ago  . Alcohol Use: No  . Drug Use: No  . Sexual Activity: Yes    Birth Control/ Protection: Surgical   Current Outpatient Prescriptions on File Prior to Visit  Medication Sig Dispense Refill  . B COMPLEX-C-FOLIC ACID ER PO Take 1 capsule by mouth daily.    . Cholecalciferol (D3 SUPER STRENGTH) 2000 UNITS CAPS Take 1 capsule by mouth daily.    Marland Kitchen doxepin (SINEQUAN) 25 MG capsule Take 25 mg by mouth at bedtime.    Marland Kitchen esomeprazole (NEXIUM) 40 MG capsule TAKE ONE CAPSULE DAILY. 30 capsule 5  . estradiol (CLIMARA - DOSED IN MG/24 HR) 0.05 mg/24hr Place 1 patch onto the skin once a week.      Marland Kitchen LORazepam (ATIVAN) 1 MG tablet Take 1 mg by mouth every 8 (eight) hours as needed.    . metFORMIN (GLUCOPHAGE) 500 MG tablet Take 1,000 mg by mouth 2 (two) times daily.     . Probiotic Product (PROBIOTIC MULTI-ENZYME) TABS Take 1 tablet by mouth daily.    . sucralfate (CARAFATE) 1 GM/10ML suspension Take 1 g by mouth 4 (four) times daily.    . Thyroid (WP THYROID) 97.5 MG TABS Take 1 tablet (97.5 mg total) by mouth daily. 30 tablet 2   No current facility-administered medications on file prior to visit.   Allergies  Allergen  Reactions  . Zegerid [Omeprazole-Sodium Bicarbonate]     Irregular Heart Beat   Family History  Problem Relation Age of Onset  . Anesthesia problems Neg Hx   . Hypotension Neg Hx   . Malignant hyperthermia Neg Hx   . Pseudochol deficiency Neg Hx    PE: BP 140/80 mmHg  Pulse 95  Temp(Src) 98.3 F (36.8 C) (Oral)  Resp 12  Wt 186 lb 6.4 oz (84.55 kg)  SpO2 96% Body mass index is 28.75 kg/(m^2). Wt Readings from Last 3 Encounters:  07/10/15 186 lb 6.4 oz (84.55 kg)  04/10/15 195 lb  12.8 oz (88.814 kg)  12/16/14 201 lb 12.8 oz (91.536 kg)   Constitutional: overweight, in NAD Eyes: PERRLA, EOMI, no exophthalmos ENT: moist mucous membranes, no thyromegaly, no cervical lymphadenopathy Cardiovascular: RRR, No MRG Respiratory: CTA B Gastrointestinal: abdomen soft, NT, ND, BS+ Musculoskeletal: no deformities, strength intact in all 4 Skin: moist, warm, no rashes Neurological: no tremor with outstretched hands, DTR normal in all 4  ASSESSMENT: 1. Hypothyroidism  2. Hyperglycemia  3. Vit D def  4. Vit B12 def.  PLAN:  1. Patient with long-standing hypothyroidism, on [facility] therapy (97.5 mg).She does not appear to have a goiter, thyroid nodules, or neck compression symptoms - We discussed about correct intake of thyroid hh: fasting, with water, separated by at least 30 minutes from breakfast, and separated by more than 4 hours from calcium, iron, multivitamins, acid reflux medications (PPIs). She is taking it correctly. - recent  thyroid tests reviewed with pt today: TSH, free T4, free T3 >> normal >> continue WP 97.5 mg daily - she would really want me to increase her dose but I explained why I would maintain it where it is for now - I will see her back in 4 months, will repeat TFTs then  2. Hyperglycemia Patient with very well controlled diabetes versus prediabetes, previously on oral antidiabetic regimen with metformin target dose. She did not check sugars since last visit >>  strongly advised her to start checking daily or every other day. No symptoms of hypoglycemia. - Reviewed recent hemoglobin A1c >> excellent: 5.5%  - I suggested to stay off metformin for now  - advised for yearly eye exams >> she is UTD - Return to clinic in 4 mo with sugar log   3. Vitamin D def - on vit D 2000 >> now 4000 IU daily - continue this dose - new vit D level normal - will recheck level at next visit  4. Vitamin B12 def - will continue with monthly im injections x total of 6 mo >> needs 1 more  - continue po supplementation - recent B12 level high - will recheck the level at next visit  - time spent with the patient: 40 min, of which >50% was spent in obtaining information about her symptoms, reviewing her previous labs, evaluations, and treatments, counseling her about her conditions (please see the discussed topics above), and developing a plan to further investigate them. She had a number of questions which I addressed.

## 2015-07-10 NOTE — Addendum Note (Signed)
Addended by: Carlus Pavlov on: 07/10/2015 11:03 AM   Modules accepted: Level of Service

## 2015-07-10 NOTE — Patient Instructions (Signed)
Please continue current regimen.  Please come back for a follow-up appointment in 4 months.

## 2015-09-11 ENCOUNTER — Other Ambulatory Visit: Payer: Self-pay | Admitting: *Deleted

## 2015-09-11 ENCOUNTER — Telehealth: Payer: Self-pay | Admitting: Internal Medicine

## 2015-09-11 MED ORDER — THYROID 97.5 MG PO TABS: 1.0000 | ORAL_TABLET | Freq: Every day | ORAL | Status: DC

## 2015-09-11 NOTE — Telephone Encounter (Signed)
Patient need refill of Thyroid (WP THYROID) 97.5 MG TABS, send to  [facility] - [place], [place] - [address] [phone] (Phone) [phone] (Fax)

## 2015-09-11 NOTE — Telephone Encounter (Signed)
rx sent

## 2015-10-14 ENCOUNTER — Encounter: Payer: Self-pay | Admitting: Internal Medicine

## 2015-11-04 ENCOUNTER — Other Ambulatory Visit: Payer: Self-pay | Admitting: *Deleted

## 2015-11-04 ENCOUNTER — Other Ambulatory Visit (INDEPENDENT_AMBULATORY_CARE_PROVIDER_SITE_OTHER): Payer: BLUE CROSS/BLUE SHIELD

## 2015-11-04 DIAGNOSIS — E559 Vitamin D deficiency, unspecified: Secondary | ICD-10-CM | POA: Diagnosis not present

## 2015-11-04 DIAGNOSIS — E039 Hypothyroidism, unspecified: Secondary | ICD-10-CM | POA: Diagnosis not present

## 2015-11-04 LAB — T4, FREE: Free T4: 0.74 ng/dL (ref 0.60–1.60)

## 2015-11-04 LAB — VITAMIN D 25 HYDROXY (VIT D DEFICIENCY, FRACTURES): VITD: 44.5 ng/mL (ref 30.00–100.00)

## 2015-11-04 LAB — T3, FREE: T3, Free: 2.8 pg/mL (ref 2.3–4.2)

## 2015-11-04 LAB — TSH: TSH: 2 u[IU]/mL (ref 0.35–4.50)

## 2015-11-06 ENCOUNTER — Other Ambulatory Visit (HOSPITAL_COMMUNITY): Payer: Self-pay | Admitting: Internal Medicine

## 2015-11-06 DIAGNOSIS — J90 Pleural effusion, not elsewhere classified: Secondary | ICD-10-CM

## 2015-11-09 ENCOUNTER — Ambulatory Visit: Payer: BLUE CROSS/BLUE SHIELD | Admitting: Internal Medicine

## 2015-11-19 ENCOUNTER — Other Ambulatory Visit (HOSPITAL_COMMUNITY): Payer: Self-pay | Admitting: Pulmonary Disease

## 2015-11-19 DIAGNOSIS — J90 Pleural effusion, not elsewhere classified: Secondary | ICD-10-CM

## 2015-11-25 ENCOUNTER — Ambulatory Visit (HOSPITAL_COMMUNITY)
Admission: RE | Admit: 2015-11-25 | Discharge: 2015-11-25 | Disposition: A | Discharge status: Home / Self Care | Payer: BLUE CROSS/BLUE SHIELD | Source: Ambulatory Visit | Attending: Pulmonary Disease | Admitting: Pulmonary Disease

## 2015-11-25 DIAGNOSIS — J90 Pleural effusion, not elsewhere classified: Secondary | ICD-10-CM | POA: Diagnosis present

## 2015-11-25 DIAGNOSIS — R079 Chest pain, unspecified: Secondary | ICD-10-CM | POA: Diagnosis not present

## 2015-11-25 LAB — POCT I-STAT CREATININE: Creatinine, Ser: 0.8 mg/dL (ref 0.44–1.00)

## 2015-11-25 MED ORDER — IOHEXOL 300 MG/ML  SOLN
75.0000 mL | Freq: Once | INTRAMUSCULAR | Status: AC | PRN
  Administered 2015-11-25: 75 mL via INTRAVENOUS

## 2016-01-05 ENCOUNTER — Encounter: Payer: Self-pay | Admitting: Internal Medicine

## 2016-01-05 ENCOUNTER — Ambulatory Visit (INDEPENDENT_AMBULATORY_CARE_PROVIDER_SITE_OTHER): Payer: BLUE CROSS/BLUE SHIELD | Admitting: Internal Medicine

## 2016-01-05 VITALS — BP 128/72 | HR 94 | Temp 97.4°F | Resp 16 | Wt 192.8 lb

## 2016-01-05 DIAGNOSIS — E538 Deficiency of other specified B group vitamins: Secondary | ICD-10-CM | POA: Diagnosis not present

## 2016-01-05 DIAGNOSIS — R739 Hyperglycemia, unspecified: Secondary | ICD-10-CM | POA: Diagnosis not present

## 2016-01-05 DIAGNOSIS — E039 Hypothyroidism, unspecified: Secondary | ICD-10-CM

## 2016-01-05 DIAGNOSIS — E559 Vitamin D deficiency, unspecified: Secondary | ICD-10-CM | POA: Diagnosis not present

## 2016-01-05 MED ORDER — THYROID 113.75 MG PO TABS: ORAL_TABLET | ORAL | Status: DC

## 2016-01-05 NOTE — Patient Instructions (Addendum)
Please stop at the lab.  You can increase the vitamin D dose to 5000 units daily.  Increase WP thyroid to 113.7 mg daily.  Please come back for a follow-up appointment in 6 months.

## 2016-01-05 NOTE — Progress Notes (Signed)
Patient ID: Tabitha Schmidt, female   DOB: 09-20-1956, 60 y.o.   MRN: [id number]   HPI  Tabitha Schmidt is a 60 y.o.-year-old female, returning for f/u for hypothyroidism, DM2, vitamin B12 def., vitamin D def.. Last visit 4 mo ago.  She is off and on a Gluten free diet. She is feeling better while on it..   Hypothyroidism: Pt. has been dx with hypothyroidism 2001; started on Levothyroxine, then Synthroid, but still felt tired, hair loss, anxiety >> then Armour 90 mg every other day or less often to "stretch the pill" (equivalent to 150 mcg LT4 daily) >> then WP 81.25 >> now WP 97.5 mg: - every day - fasting, at 4 am - separated by 60 min from b'fast  - no calcium, iron, multivitamins  - no PPIs  I reviewed pt's thyroid tests: Lab Results  Component Value Date   TSH 2.00 11/04/2015   TSH 1.81 07/07/2015   TSH 7.27* 04/10/2015   TSH 21.29* 01/30/2015   TSH 34.67* 12/16/2014   FREET4 0.74 11/04/2015   FREET4 0.71 07/07/2015   FREET4 0.47* 04/10/2015   FREET4 0.45* 01/30/2015   FREET4 0.40* 12/16/2014   - per records from Dr Patrecia Pace: 05/19/2014: TSH 0.127, total T4 6.8 (4.5-12), free T4 0.81 (0.82-1.77), free T3 3.1 (2-4.4) TPO antibodies 12 (0-34), ATA <1 (0-0.9)  Reviewed thyroid ultrasound reports from Dr. Patrecia Pace - latest: 07/12/2013: Right lobe 1.58 x 0.86 x 2.19 cm; left lobe: 1.26 x 0.76 x 1.66 cm. Isthmus: 0.25 cm. The thyroid echogenicity is irregular. No nodules.  Pt describes: - + fatigue in the evening - + weight loss - diet - improved anxiety  - + feet burning - no diarrhea/+ constipation - + dry skin - + hair loss  Pt denies feeling nodules in neck, but + hoarseness, occasional dysphagia/no odynophagia, no SOB with lying down, with exertion. She used to have asthma. Inhalers help.  Hyperglycemia: - Dx in 2013 - On metformin 1000 mg 2x a day >> ran out in 04/2014 >> now off - Well-controlled, last hemoglobin A1c: Lab Results  Component Value Date   HGBA1C 5.5  07/07/2015   HGBA1C 5.3 04/10/2015   HGBA1C 5.7 12/17/2014  Previously: 5.6% on 05/19/2014 - Meter: ReliOn - diet: now gluten-free >> lost 15 lbs since 12/2014!!! - Exercises by walking 3 times a week  - No CKD. Last BUN/creatinine: Lab Results  Component Value Date   BUN 12 12/16/2014   CREATININE 0.80 11/25/2015   - She has hyperlipidemia, no lipid levels available for review. She is on simvastatin 20 mg daily  - ? neuropathy. Had numbness-tingling in her legs >> used gabapentin in the past. Tried Cymbalta >> it helped >> ran out.  - Latest eye exam: 2016 (Dr [name]). No DR. H/o cataracts. Had Laser sx for cataracts.  She also has a history of hypertension, hyperlipidemia.   She has B12 vitamin deficiency >> started injections - here in the office >> feels much better. Latest level: Lab Results  Component Value Date   VITAMINB12 943* 07/07/2015  She is now on B12 vitamin  - from B complex.   She has Vitamin D def >> started D3 2000 units daily, but then on 4000 IU daily:   Component     Latest Ref Rng 12/16/2014 04/10/2015 07/07/2015  VITD     30.00 - 100.00 ng/mL 19.89 (L) 24.59 (L) 40.83    ROS: Constitutional: See history of present illness, + poor sleep Eyes:  no blurry vision, no xerophthalmia ENT: no sore throat, see history of present illness Cardiovascular: no CP/SOB/no palpitations/+ leg swelling Respiratory: no cough/SOB Gastrointestinal: no N/heartburn/V/+ D/+ C Musculoskeletal: + both: muscle/joint aches Skin: no rashes, + hair loss Neurological: no tremors/numbness/tingling/dizziness  I reviewed pt's medications, allergies, PMH, social hx, family hx, and changes were documented in the history of present illness. Otherwise, unchanged from my initial visit note.  Past Medical History  Diagnosis Date  . Hypothyroid   . DM (diabetes mellitus) (HCC)   . GERD (gastroesophageal reflux disease)   . Cataracts, bilateral     [name]  . COPD (chronic obstructive  pulmonary disease) (HCC)   . Shortness of breath   . Anxiety    Past Surgical History  Procedure Laterality Date  . Tubal ligation    . Cholecystectomy    . Dilation and curettage of uterus    . Abdominal hysterectomy    . Breast surgery      removal of bilateral benign tumors  . Esophagogastroduodenoscopy  01/26/2012    Procedure: ESOPHAGOGASTRODUODENOSCOPY (EGD);  Surgeon: Malissa Hippo, MD;  Location: [facility];  Service: Endoscopy;  Laterality: N/A;  730   History   Social History  . Marital Status: Single    Spouse Name: N/A  . Number of Children: 3 + 2 adopted   Occupational History  . retired   Social History Main Topics  . Smoking status: Former Smoker -- 1.00 packs/day for 40 years    Types: Cigarettes    Quit date: 01/25/2005  . Smokeless tobacco: Not on file     Comment: quit 5 yrs ago  . Alcohol Use: No  . Drug Use: No  . Sexual Activity: Yes    Birth Control/ Protection: Surgical   Current Outpatient Prescriptions on File Prior to Visit  Medication Sig Dispense Refill  . B COMPLEX-C-FOLIC ACID ER PO Take 1 capsule by mouth daily.    . Cholecalciferol (D3 SUPER STRENGTH) 2000 UNITS CAPS Take 1 capsule by mouth daily.    Marland Kitchen doxepin (SINEQUAN) 25 MG capsule Take 25 mg by mouth at bedtime.    Marland Kitchen esomeprazole (NEXIUM) 40 MG capsule TAKE ONE CAPSULE DAILY. 30 capsule 5  . estradiol (CLIMARA - DOSED IN MG/24 HR) 0.05 mg/24hr Place 1 patch onto the skin once a week.      Marland Kitchen LORazepam (ATIVAN) 1 MG tablet Take 1 mg by mouth every 8 (eight) hours as needed.    . metFORMIN (GLUCOPHAGE) 500 MG tablet Take 1,000 mg by mouth 2 (two) times daily.     . Probiotic Product (PROBIOTIC MULTI-ENZYME) TABS Take 1 tablet by mouth daily.    . sucralfate (CARAFATE) 1 GM/10ML suspension Take 1 g by mouth 4 (four) times daily.    . Thyroid (WP THYROID) 97.5 MG TABS Take 1 tablet (97.5 mg total) by mouth daily. 30 tablet 2   No current facility-administered medications on file  prior to visit.   Allergies  Allergen Reactions  . Zegerid [Omeprazole-Sodium Bicarbonate]     Irregular Heart Beat   Family History  Problem Relation Age of Onset  . Anesthesia problems Neg Hx   . Hypotension Neg Hx   . Malignant hyperthermia Neg Hx   . Pseudochol deficiency Neg Hx    PE: BP 128/72 mmHg  Pulse 94  Temp(Src) 97.4 F (36.3 C) (Oral)  Resp 16  Wt 192 lb 12.8 oz (87.454 kg)  SpO2 95% Body mass index is 29.73 kg/(m^2).  Wt Readings from Last 3 Encounters:  01/05/16 192 lb 12.8 oz (87.454 kg)  07/10/15 186 lb 6.4 oz (84.55 kg)  04/10/15 195 lb 12.8 oz (88.814 kg)   Constitutional: overweight, in NAD Eyes: PERRLA, EOMI, no exophthalmos ENT: moist mucous membranes, no thyromegaly, no cervical lymphadenopathy Cardiovascular: RRR, No MRG Respiratory: CTA B Gastrointestinal: abdomen soft, NT, ND, BS+ Musculoskeletal: no deformities, strength intact in all 4 Skin: moist, warm, no rashes Neurological: no tremor with outstretched hands, DTR normal in all 4  ASSESSMENT: 1. Hypothyroidism  2. Hyperglycemia  3. Vit D def  4. Vit B12 def.  PLAN:  1. Patient with long-standing hypothyroidism, on [facility] therapy (97.5 mg).She does not appear to have a goiter, thyroid nodules, or neck compression symptoms - We discussed about correct intake of thyroid hh: fasting, with water, separated by at least 30 minutes from breakfast, and separated by more than 4 hours from calcium, iron, multivitamins, acid reflux medications (PPIs). She is taking it correctly. - latest thyroid tests reviewed with pt today: TSH, free T4, free T3 >> normal, however, she would like to try a higher dose of WP. We discussed about possible side effects, but I agreed to try to go to the next available dose (113.7 mg) and recheck her TFTs in 5-6 weeks.  - I will see her back in 6 months  2. Hyperglycemia Patient with very well controlled diabetes versus prediabetes, previously on oral antidiabetic regimen  with metformin target dose, now off metformin. No symptoms of hypoglycemia. - Reviewed recent hemoglobin A1c >> excellent: Lab Results  Component Value Date   HGBA1C 5.5 07/07/2015  - will keep her off metformin for now  - advised for yearly eye exams >> she is UTD - check A1c today - Return to clinic in 6 mo   3. Vitamin D def - on vit D 2000 >> now 4000 IU daily - continue this dose - last vit D level normal, higher than 40, however, she feels that this is insufficient, and would like to be close to 80. She is wondering whether she should increase the dose of vitamin D, and this would be fine >> she can increase to 5000 units daily - will recheck level today  4. Vitamin B12 def - will continue with monthly im injections x total of 6 mo, now on po supplementation - last B12 level high - will recheck the level today  - time spent with the patient: 40 min, of which >50% was spent in obtaining information about her symptoms, reviewing her previous labs, evaluations, and treatments, counseling her about her conditions (please see the discussed topics above), and developing a plan to further investigate and treat them. She had a number of questions which I addressed.  Office Visit on 01/05/2016  Component Date Value Ref Range Status  . Vitamin B-12 01/05/2016 362  211 - 911 pg/mL Final  . VITD 01/05/2016 29.65* 30.00 - 100.00 ng/mL Final  . Hgb A1c MFr Bld 01/05/2016 5.6  4.6 - 6.5 % Final   Glycemic Control Guidelines for People with Diabetes:Non Diabetic:  <6%Goal of Therapy: <7%Additional Action Suggested:  >8%    HbA1c is great.  Vitamin B12 lower than before >> double her supplement dose.  Vitamin D is now slightly low >> increase vitamin D to 4 x2000 IU capsules daily.

## 2016-01-06 LAB — HEMOGLOBIN A1C: Hgb A1c MFr Bld: 5.6 % (ref 4.6–6.5)

## 2016-01-06 LAB — VITAMIN D 25 HYDROXY (VIT D DEFICIENCY, FRACTURES): VITD: 29.65 ng/mL — ABNORMAL LOW (ref 30.00–100.00)

## 2016-01-06 LAB — VITAMIN B12: Vitamin B-12: 362 pg/mL (ref 211–911)

## 2016-01-07 ENCOUNTER — Telehealth: Payer: Self-pay | Admitting: *Deleted

## 2016-01-07 ENCOUNTER — Telehealth: Payer: Self-pay | Admitting: Internal Medicine

## 2016-01-07 NOTE — Telephone Encounter (Signed)
Called pt and lvm advising her per Dr Charlean Sanfilippo result note from her labs.

## 2016-01-07 NOTE — Telephone Encounter (Signed)
Pt called stating she received my vm with her lab results. Pt stated that she was on the B12 inj. She is on a B complex at this time. She is taking 2 tablets daily (the total of the B12 is 500 mg). Pt wants to know if she can do the B12 inj again.

## 2016-01-07 NOTE — Telephone Encounter (Signed)
Called pt and advised her per Dr Gherghe's message. Pt voiced understanding.  

## 2016-01-07 NOTE — Telephone Encounter (Signed)
Patient called stating that her b-12 is low again   What does she need to do?  Please advise   Thank you

## 2016-01-07 NOTE — Telephone Encounter (Signed)
I would increase the B12 dose to 1000 mcg for now.

## 2016-04-04 ENCOUNTER — Telehealth: Payer: Self-pay | Admitting: Internal Medicine

## 2016-04-04 MED ORDER — THYROID 113.75 MG PO TABS: ORAL_TABLET | ORAL | Status: DC

## 2016-04-04 NOTE — Telephone Encounter (Signed)
Refill sent to pt's pharmacy. 

## 2016-04-04 NOTE — Telephone Encounter (Signed)
Patient need a refill of medication Thyroid (WP THYROID) 113.75 MG TABS send to  [facility] - [place], [place] - [address] [phone] (Phone) [phone] (Fax)

## 2016-07-05 ENCOUNTER — Other Ambulatory Visit: Payer: BLUE CROSS/BLUE SHIELD

## 2016-07-07 ENCOUNTER — Ambulatory Visit: Payer: BLUE CROSS/BLUE SHIELD | Admitting: Internal Medicine

## 2016-07-12 ENCOUNTER — Other Ambulatory Visit (HOSPITAL_COMMUNITY): Payer: Self-pay | Admitting: Pharmacist

## 2016-08-10 ENCOUNTER — Ambulatory Visit (HOSPITAL_COMMUNITY)
Admission: RE | Admit: 2016-08-10 | Discharge: 2016-08-10 | Disposition: A | Discharge status: Home / Self Care | Payer: BLUE CROSS/BLUE SHIELD | Source: Ambulatory Visit | Attending: Pulmonary Disease | Admitting: Pulmonary Disease

## 2016-08-10 ENCOUNTER — Other Ambulatory Visit (HOSPITAL_COMMUNITY): Payer: Self-pay | Admitting: Pulmonary Disease

## 2016-08-10 DIAGNOSIS — M25551 Pain in right hip: Secondary | ICD-10-CM | POA: Insufficient documentation

## 2016-08-10 DIAGNOSIS — G629 Polyneuropathy, unspecified: Secondary | ICD-10-CM

## 2016-08-10 DIAGNOSIS — M47896 Other spondylosis, lumbar region: Secondary | ICD-10-CM | POA: Diagnosis not present

## 2016-08-10 DIAGNOSIS — I700 Atherosclerosis of aorta: Secondary | ICD-10-CM | POA: Insufficient documentation

## 2016-08-30 ENCOUNTER — Other Ambulatory Visit (HOSPITAL_COMMUNITY): Payer: Self-pay | Admitting: Pulmonary Disease

## 2016-08-30 DIAGNOSIS — M545 Low back pain: Secondary | ICD-10-CM

## 2016-10-10 ENCOUNTER — Ambulatory Visit (HOSPITAL_COMMUNITY): Payer: BLUE CROSS/BLUE SHIELD

## 2017-12-27 ENCOUNTER — Ambulatory Visit (HOSPITAL_COMMUNITY)
Admission: RE | Admit: 2017-12-27 | Discharge: 2017-12-27 | Disposition: A | Discharge status: Home / Self Care | Payer: BLUE CROSS/BLUE SHIELD | Source: Ambulatory Visit | Attending: Pulmonary Disease | Admitting: Pulmonary Disease

## 2017-12-27 DIAGNOSIS — R002 Palpitations: Secondary | ICD-10-CM | POA: Diagnosis not present

## 2019-01-05 ENCOUNTER — Other Ambulatory Visit: Payer: Self-pay

## 2019-01-05 ENCOUNTER — Emergency Department (HOSPITAL_COMMUNITY)
Admission: EM | Admit: 2019-01-05 | Discharge: 2019-01-05 | Disposition: A | Discharge status: Home / Self Care | Payer: Medicare Other | Attending: Emergency Medicine | Admitting: Emergency Medicine

## 2019-01-05 ENCOUNTER — Encounter (HOSPITAL_COMMUNITY): Payer: Self-pay | Admitting: Emergency Medicine

## 2019-01-05 ENCOUNTER — Emergency Department (HOSPITAL_COMMUNITY): Payer: Medicare Other

## 2019-01-05 DIAGNOSIS — J111 Influenza due to unidentified influenza virus with other respiratory manifestations: Secondary | ICD-10-CM | POA: Diagnosis not present

## 2019-01-05 DIAGNOSIS — F17210 Nicotine dependence, cigarettes, uncomplicated: Secondary | ICD-10-CM | POA: Insufficient documentation

## 2019-01-05 DIAGNOSIS — Z79899 Other long term (current) drug therapy: Secondary | ICD-10-CM | POA: Diagnosis not present

## 2019-01-05 DIAGNOSIS — M791 Myalgia, unspecified site: Secondary | ICD-10-CM | POA: Diagnosis present

## 2019-01-05 DIAGNOSIS — E039 Hypothyroidism, unspecified: Secondary | ICD-10-CM | POA: Diagnosis not present

## 2019-01-05 DIAGNOSIS — J449 Chronic obstructive pulmonary disease, unspecified: Secondary | ICD-10-CM | POA: Insufficient documentation

## 2019-01-05 DIAGNOSIS — R69 Illness, unspecified: Secondary | ICD-10-CM

## 2019-01-05 MED ORDER — PREDNISONE 20 MG PO TABS: 40.0000 mg | ORAL_TABLET | Freq: Every day | ORAL | 0 refills | Status: DC

## 2019-01-05 MED ORDER — IPRATROPIUM-ALBUTEROL 0.5-2.5 (3) MG/3ML IN SOLN
3.0000 mL | Freq: Once | RESPIRATORY_TRACT | Status: AC
  Administered 2019-01-05: 3 mL via RESPIRATORY_TRACT
  Filled 2019-01-05: qty 3

## 2019-01-05 MED ORDER — DEXAMETHASONE SODIUM PHOSPHATE 4 MG/ML IJ SOLN
12.0000 mg | Freq: Once | INTRAMUSCULAR | Status: AC
  Administered 2019-01-05: 12 mg via INTRAMUSCULAR
  Filled 2019-01-05: qty 3

## 2019-01-05 MED ORDER — OSELTAMIVIR PHOSPHATE 75 MG PO CAPS: 75.0000 mg | ORAL_CAPSULE | Freq: Two times a day (BID) | ORAL | 0 refills | Status: DC

## 2019-01-05 NOTE — ED Triage Notes (Signed)
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

## 2019-01-05 NOTE — ED Triage Notes (Signed)
Pt states yesterday she woke up feeling like she had been hit by a truck.  Did not get a flu shot this year.  Has since developed a cough with yellow sputum and intermittent fever.

## 2019-01-18 NOTE — ED Provider Notes (Signed)
[facility] EMERGENCY DEPARTMENT Provider Note   CSN: [id number] Arrival date & time: 01/05/19  1017    History   Chief Complaint Chief Complaint  Patient presents with  . Generalized Body Aches    HPI Tabitha Schmidt is a 63 y.o. female.     HPI   [name] with subjective fever and body aches. Onset yesterday. Says she felt like she was "hit by a truck" when she woke up. Persistent since. Hurts from head to toe. Coughing. Occasionally productive. Two contacts she reports were diagnosed with influenza recently.   Past Medical History:  Diagnosis Date  . Anxiety   . Cataracts, bilateral    [name]  . COPD (chronic obstructive pulmonary disease) (HCC)   . DM (diabetes mellitus) (HCC)   . GERD (gastroesophageal reflux disease)   . Hypothyroid   . Shortness of breath     Patient Active Problem List   Diagnosis Date Noted  . Hyperglycemia 07/10/2015  . Vitamin B12 deficiency 05/06/2015  . Vitamin D deficiency 05/06/2015  . Chronic fatigue 12/16/2014  . Hypothyroidism 12/16/2014  . GERD (gastroesophageal reflux disease) 01/03/2012  . Plantar fasciitis 05/26/2011  . PLANTAR FACIITIS 11/24/2010    Past Surgical History:  Procedure Laterality Date  . ABDOMINAL HYSTERECTOMY    . BREAST SURGERY     removal of bilateral benign tumors  . CHOLECYSTECTOMY    . DILATION AND CURETTAGE OF UTERUS    . ESOPHAGOGASTRODUODENOSCOPY  01/26/2012   Procedure: ESOPHAGOGASTRODUODENOSCOPY (EGD);  Surgeon: Malissa Hippo, MD;  Location: [facility];  Service: Endoscopy;  Laterality: N/A;  730  . TUBAL LIGATION       OB History   No obstetric history on file.      Home Medications    Prior to Admission medications   Medication Sig Start Date End Date Taking? Authorizing Provider  B COMPLEX-C-FOLIC ACID ER PO Take 1 capsule by mouth daily.   Yes [provider]  escitalopram (LEXAPRO) 10 MG tablet Take 10 mg by mouth daily.   Yes [provider]  LORazepam  (ATIVAN) 1 MG tablet Take 1 mg by mouth 4 (four) times daily.   Yes [provider]  Thyroid (WP THYROID) 113.75 MG TABS Take 1 tab daily in AM. **PT NEEDS LAB APPT** Patient taking differently: Take 97.5 mg by mouth daily. Take 1 tab daily in AM. **PT NEEDS LAB APPT** 04/04/16  Yes Carlus Pavlov, MD  oseltamivir (TAMIFLU) 75 MG capsule Take 1 capsule (75 mg total) by mouth every 12 (twelve) hours. 01/05/19   Raeford Razor, MD  predniSONE (DELTASONE) 20 MG tablet Take 2 tablets (40 mg total) by mouth daily. 01/05/19   Raeford Razor, MD    Family History Family History  Problem Relation Age of Onset  . Anesthesia problems Neg Hx   . Hypotension Neg Hx   . Malignant hyperthermia Neg Hx   . Pseudochol deficiency Neg Hx     Social History Social History   Tobacco Use  . Smoking status: Current Every Day Smoker    Packs/day: 0.50    Years: 40.00    Pack years: 20.00    Types: Cigarettes  . Smokeless tobacco: Never Used  Substance Use Topics  . Alcohol use: No  . Drug use: No    Types: Cocaine     Allergies   Zegerid [omeprazole-sodium bicarbonate]   Review of Systems Review of Systems All systems reviewed and negative, other than as noted in HPI.  Physical Exam Updated Vital Signs BP (!) 121/58   Pulse 70   Temp 98.8 F (37.1 C) (Oral)   Resp (!) 28   Ht 5\' 7"  (1.702 m)   Wt 74.8 kg   SpO2 90%   BMI 25.84 kg/m   Physical Exam Vitals signs and nursing note reviewed.  Constitutional:      General: She is not in acute distress.    Appearance: She is well-developed.  HENT:     Head: Normocephalic and atraumatic.  Eyes:     General:        Right eye: No discharge.        Left eye: No discharge.     Conjunctiva/sclera: Conjunctivae normal.  Neck:     Musculoskeletal: Neck supple.  Cardiovascular:     Rate and Rhythm: Normal rate and regular rhythm.     Heart sounds: Normal heart sounds. No murmur. No friction rub. No gallop.   Pulmonary:      Effort: Pulmonary effort is normal. No respiratory distress.     Breath sounds: Wheezing present.  Abdominal:     General: There is no distension.     Palpations: Abdomen is soft.     Tenderness: There is no abdominal tenderness.  Musculoskeletal:        General: No tenderness.     Comments: Lower extremities symmetric as compared to each other. No calf tenderness. Negative Homan's. No palpable cords.   Skin:    General: Skin is warm and dry.  Neurological:     Mental Status: She is alert.  Psychiatric:        Behavior: Behavior normal.        Thought Content: Thought content normal.      ED Treatments / Results  Labs (all labs ordered are listed, but only abnormal results are displayed) Labs Reviewed - No data to display  EKG EKG Interpretation  Date/Time:  Saturday January 05 2019 10:27:18 EST Ventricular Rate:  85 PR Interval:    QRS Duration: 90 QT Interval:  372 QTC Calculation: 443 R Axis:   79 Text Interpretation:  Sinus rhythm RSR' in V1 or V2, right VCD or RVH Nonspecific T abnormalities, lateral leads Confirmed by Raeford Razor [id number]) on 01/05/2019 10:47:56 AM   Radiology No results found.   Dg Chest 2 View  Result Date: 01/05/2019 CLINICAL DATA:  Cough and fever EXAM: CHEST - 2 VIEW COMPARISON:  11/25/15 FINDINGS: The heart size and mediastinal contours are within normal limits. Both lungs are hyperinflated but clear. The visualized skeletal structures are unremarkable. IMPRESSION: COPD without acute abnormality. Electronically Signed   By: Alcide Clever M.D.   On: 01/05/2019 11:51   Procedures Procedures (including critical care time)  Medications Ordered in ED Medications  ipratropium-albuterol (DUONEB) 0.5-2.5 (3) MG/3ML nebulizer solution 3 mL (3 mLs Nebulization Given 01/05/19 1138)  dexamethasone (DECADRON) injection 12 mg (12 mg Intramuscular Given 01/05/19 1121)     Initial Impression / Assessment and Plan / ED Course  I have reviewed the triage  vital signs and the nursing notes.  Pertinent labs & imaging results that were available during my care of the patient were reviewed by me and considered in my medical decision making (see chart for details).        [name] with flu like symptoms. Reports two contacts with confirmed influenza. Will give prophylaxis. Wheezing on exam. CXR with cornic appearing changes. No pneumonia. Steroids.  It has been determined that no acute conditions  requiring further emergency intervention are present at this time. The patient has been advised of the diagnosis and plan. I reviewed any labs and imaging including any potential incidental findings. I have reviewed nursing notes and appropriate previous records. We have discussed signs and symptoms that warrant return to the ED and they are listed in the discharge instructions.      Final Clinical Impressions(s) / ED Diagnoses   Final diagnoses:  Influenza-like illness    ED Discharge Orders         Ordered    predniSONE (DELTASONE) 20 MG tablet  Daily     01/05/19 1357    oseltamivir (TAMIFLU) 75 MG capsule  Every 12 hours     01/05/19 1357           Raeford Razor, MD 01/18/19 1232

## 2019-02-18 ENCOUNTER — Ambulatory Visit (HOSPITAL_COMMUNITY)
Admission: RE | Admit: 2019-02-18 | Discharge: 2019-02-18 | Disposition: A | Discharge status: Home / Self Care | Payer: Medicare Other | Source: Ambulatory Visit | Attending: Pulmonary Disease | Admitting: Pulmonary Disease

## 2019-02-18 ENCOUNTER — Other Ambulatory Visit: Payer: Self-pay

## 2019-02-18 ENCOUNTER — Other Ambulatory Visit (HOSPITAL_COMMUNITY): Payer: Self-pay | Admitting: Pulmonary Disease

## 2019-02-18 DIAGNOSIS — R0602 Shortness of breath: Secondary | ICD-10-CM | POA: Diagnosis not present

## 2019-05-13 ENCOUNTER — Observation Stay (HOSPITAL_BASED_OUTPATIENT_CLINIC_OR_DEPARTMENT_OTHER): Payer: Medicare Other

## 2019-05-13 ENCOUNTER — Emergency Department (HOSPITAL_COMMUNITY): Payer: Medicare Other

## 2019-05-13 ENCOUNTER — Observation Stay (HOSPITAL_COMMUNITY): Payer: Medicare Other

## 2019-05-13 ENCOUNTER — Other Ambulatory Visit: Payer: Self-pay

## 2019-05-13 ENCOUNTER — Observation Stay (HOSPITAL_COMMUNITY)
Admission: EM | Admit: 2019-05-13 | Discharge: 2019-05-14 | Disposition: A | Discharge status: Home / Self Care | Payer: Medicare Other | Attending: Internal Medicine | Admitting: Internal Medicine

## 2019-05-13 ENCOUNTER — Encounter (HOSPITAL_COMMUNITY): Payer: Self-pay | Admitting: *Deleted

## 2019-05-13 DIAGNOSIS — E785 Hyperlipidemia, unspecified: Secondary | ICD-10-CM | POA: Diagnosis not present

## 2019-05-13 DIAGNOSIS — R778 Other specified abnormalities of plasma proteins: Secondary | ICD-10-CM

## 2019-05-13 DIAGNOSIS — F419 Anxiety disorder, unspecified: Secondary | ICD-10-CM | POA: Diagnosis not present

## 2019-05-13 DIAGNOSIS — E119 Type 2 diabetes mellitus without complications: Secondary | ICD-10-CM | POA: Diagnosis not present

## 2019-05-13 DIAGNOSIS — R0789 Other chest pain: Secondary | ICD-10-CM | POA: Diagnosis not present

## 2019-05-13 DIAGNOSIS — I2510 Atherosclerotic heart disease of native coronary artery without angina pectoris: Secondary | ICD-10-CM | POA: Diagnosis not present

## 2019-05-13 DIAGNOSIS — Z79899 Other long term (current) drug therapy: Secondary | ICD-10-CM | POA: Diagnosis not present

## 2019-05-13 DIAGNOSIS — R079 Chest pain, unspecified: Secondary | ICD-10-CM

## 2019-05-13 DIAGNOSIS — Z20828 Contact with and (suspected) exposure to other viral communicable diseases: Secondary | ICD-10-CM | POA: Insufficient documentation

## 2019-05-13 DIAGNOSIS — R0989 Other specified symptoms and signs involving the circulatory and respiratory systems: Secondary | ICD-10-CM

## 2019-05-13 DIAGNOSIS — I25118 Atherosclerotic heart disease of native coronary artery with other forms of angina pectoris: Secondary | ICD-10-CM

## 2019-05-13 DIAGNOSIS — Z720 Tobacco use: Secondary | ICD-10-CM

## 2019-05-13 DIAGNOSIS — J449 Chronic obstructive pulmonary disease, unspecified: Secondary | ICD-10-CM

## 2019-05-13 DIAGNOSIS — E039 Hypothyroidism, unspecified: Secondary | ICD-10-CM | POA: Diagnosis not present

## 2019-05-13 DIAGNOSIS — F17210 Nicotine dependence, cigarettes, uncomplicated: Secondary | ICD-10-CM | POA: Insufficient documentation

## 2019-05-13 DIAGNOSIS — R7989 Other specified abnormal findings of blood chemistry: Secondary | ICD-10-CM

## 2019-05-13 DIAGNOSIS — K219 Gastro-esophageal reflux disease without esophagitis: Secondary | ICD-10-CM

## 2019-05-13 HISTORY — DX: Atherosclerotic heart disease of native coronary artery without angina pectoris: I25.10

## 2019-05-13 HISTORY — DX: Hyperlipidemia, unspecified: E78.5

## 2019-05-13 LAB — CBC WITH DIFFERENTIAL/PLATELET
Abs Immature Granulocytes: 0.01 10*3/uL (ref 0.00–0.07)
Basophils Absolute: 0 10*3/uL (ref 0.0–0.1)
Basophils Relative: 1 %
Eosinophils Absolute: 0.2 10*3/uL (ref 0.0–0.5)
Eosinophils Relative: 3 %
HCT: 40.3 % (ref 36.0–46.0)
Hemoglobin: 13.5 g/dL (ref 12.0–15.0)
Immature Granulocytes: 0 %
Lymphocytes Relative: 42 %
Lymphs Abs: 2.5 10*3/uL (ref 0.7–4.0)
MCH: 31 pg (ref 26.0–34.0)
MCHC: 33.5 g/dL (ref 30.0–36.0)
MCV: 92.6 fL (ref 80.0–100.0)
Monocytes Absolute: 0.7 10*3/uL (ref 0.1–1.0)
Monocytes Relative: 12 %
Neutro Abs: 2.5 10*3/uL (ref 1.7–7.7)
Neutrophils Relative %: 42 %
Platelets: 252 10*3/uL (ref 150–400)
RBC: 4.35 MIL/uL (ref 3.87–5.11)
RDW: 12.3 % (ref 11.5–15.5)
WBC: 5.9 10*3/uL (ref 4.0–10.5)
nRBC: 0 % (ref 0.0–0.2)

## 2019-05-13 LAB — TROPONIN I (HIGH SENSITIVITY)
Troponin I (High Sensitivity): 156 ng/L (ref <18)
Troponin I (High Sensitivity): 167 ng/L (ref <18)
Troponin I (High Sensitivity): 193 ng/L (ref <18)
Troponin I (High Sensitivity): 187 ng/L (ref <18)
Troponin I (High Sensitivity): 197 ng/L (ref <18)

## 2019-05-13 LAB — URINALYSIS, ROUTINE W REFLEX MICROSCOPIC
Bilirubin Urine: NEGATIVE
Glucose, UA: NEGATIVE mg/dL
Hgb urine dipstick: NEGATIVE
Ketones, ur: NEGATIVE mg/dL
Leukocytes,Ua: NEGATIVE
Nitrite: NEGATIVE
Protein, ur: NEGATIVE mg/dL
Specific Gravity, Urine: 1.006 (ref 1.005–1.030)
pH: 7 (ref 5.0–8.0)

## 2019-05-13 LAB — ECHOCARDIOGRAM COMPLETE
Height: 66.5 in
Weight: 2880 oz

## 2019-05-13 LAB — BASIC METABOLIC PANEL
Anion gap: 12 (ref 5–15)
BUN: 20 mg/dL (ref 8–23)
CO2: 23 mmol/L (ref 22–32)
Calcium: 9.8 mg/dL (ref 8.9–10.3)
Chloride: 105 mmol/L (ref 98–111)
Creatinine, Ser: 0.54 mg/dL (ref 0.44–1.00)
GFR calc Af Amer: >60 mL/min (ref >60)
GFR calc non Af Amer: >60 mL/min (ref >60)
Glucose, Bld: 106 mg/dL — ABNORMAL HIGH (ref 70–99)
Potassium: 3.9 mmol/L (ref 3.5–5.1)
Sodium: 140 mmol/L (ref 135–145)

## 2019-05-13 LAB — TSH: TSH: 0.152 u[IU]/mL — ABNORMAL LOW (ref 0.350–4.500)

## 2019-05-13 LAB — HEMOGLOBIN A1C
Hgb A1c MFr Bld: 5.3 % (ref 4.8–5.6)
Mean Plasma Glucose: 105.41 mg/dL

## 2019-05-13 LAB — D-DIMER, QUANTITATIVE: D-Dimer, Quant: 0.51 ug/mL-FEU — ABNORMAL HIGH (ref 0.00–0.50)

## 2019-05-13 LAB — SARS CORONAVIRUS 2 BY RT PCR (HOSPITAL ORDER, PERFORMED IN ~~LOC~~ HOSPITAL LAB): SARS Coronavirus 2: NEGATIVE

## 2019-05-13 MED ORDER — ONDANSETRON HCL 4 MG/2ML IJ SOLN: 4.0000 mg | Freq: Four times a day (QID) | INTRAMUSCULAR | Status: DC | PRN

## 2019-05-13 MED ORDER — HEPARIN SODIUM (PORCINE) 5000 UNIT/ML IJ SOLN
5000.0000 [IU] | Freq: Three times a day (TID) | INTRAMUSCULAR | Status: DC
  Administered 2019-05-13: 5000 [IU] via SUBCUTANEOUS
  Filled 2019-05-13: qty 1

## 2019-05-13 MED ORDER — LEVOTHYROXINE SODIUM 88 MCG PO TABS
113.7500 ug | ORAL_TABLET | Freq: Every day | ORAL | Status: DC
  Filled 2019-05-13 (×3): qty 1

## 2019-05-13 MED ORDER — ALBUTEROL SULFATE (2.5 MG/3ML) 0.083% IN NEBU: 2.5000 mg | INHALATION_SOLUTION | Freq: Four times a day (QID) | RESPIRATORY_TRACT | Status: DC | PRN

## 2019-05-13 MED ORDER — ESCITALOPRAM OXALATE 10 MG PO TABS: 10.0000 mg | ORAL_TABLET | Freq: Every day | ORAL | Status: DC

## 2019-05-13 MED ORDER — ACETAMINOPHEN 325 MG PO TABS
650.0000 mg | ORAL_TABLET | Freq: Once | ORAL | Status: AC
  Administered 2019-05-13: 650 mg via ORAL
  Filled 2019-05-13: qty 2

## 2019-05-13 MED ORDER — PANTOPRAZOLE SODIUM 40 MG PO TBEC
40.0000 mg | DELAYED_RELEASE_TABLET | Freq: Every day | ORAL | Status: DC
  Administered 2019-05-13 – 2019-05-14 (×2): 40 mg via ORAL
  Filled 2019-05-13 (×3): qty 1

## 2019-05-13 MED ORDER — ALUM & MAG HYDROXIDE-SIMETH 200-200-20 MG/5ML PO SUSP
30.0000 mL | Freq: Four times a day (QID) | ORAL | Status: DC | PRN
  Administered 2019-05-13: 30 mL via ORAL
  Filled 2019-05-13: qty 30

## 2019-05-13 MED ORDER — ASPIRIN EC 81 MG PO TBEC
81.0000 mg | DELAYED_RELEASE_TABLET | Freq: Every day | ORAL | Status: DC
  Administered 2019-05-14: 81 mg via ORAL
  Filled 2019-05-13: qty 1

## 2019-05-13 MED ORDER — MOMETASONE FURO-FORMOTEROL FUM 200-5 MCG/ACT IN AERO
2.0000 | INHALATION_SPRAY | Freq: Two times a day (BID) | RESPIRATORY_TRACT | Status: DC
  Filled 2019-05-13 (×2): qty 8.8

## 2019-05-13 MED ORDER — ASPIRIN 81 MG PO CHEW
324.0000 mg | CHEWABLE_TABLET | Freq: Once | ORAL | Status: AC
  Administered 2019-05-13: 324 mg via ORAL
  Filled 2019-05-13: qty 4

## 2019-05-13 MED ORDER — HEPARIN BOLUS VIA INFUSION
2000.0000 [IU] | Freq: Once | INTRAVENOUS | Status: AC
  Administered 2019-05-13: 2000 [IU] via INTRAVENOUS
  Filled 2019-05-13: qty 2000

## 2019-05-13 MED ORDER — REGADENOSON 0.4 MG/5ML IV SOLN
0.4000 mg | Freq: Once | INTRAVENOUS | Status: AC
  Administered 2019-05-14: 0.4 mg via INTRAVENOUS
  Filled 2019-05-13: qty 5

## 2019-05-13 MED ORDER — METOPROLOL TARTRATE 25 MG PO TABS
25.0000 mg | ORAL_TABLET | Freq: Two times a day (BID) | ORAL | Status: DC
  Administered 2019-05-13 – 2019-05-14 (×2): 25 mg via ORAL
  Filled 2019-05-13 (×3): qty 1

## 2019-05-13 MED ORDER — LORAZEPAM 1 MG PO TABS: 1.0000 mg | ORAL_TABLET | Freq: Three times a day (TID) | ORAL | Status: DC | PRN

## 2019-05-13 MED ORDER — ACETAMINOPHEN 325 MG PO TABS: 650.0000 mg | ORAL_TABLET | ORAL | Status: DC | PRN

## 2019-05-13 MED ORDER — HEPARIN (PORCINE) 25000 UT/250ML-% IV SOLN
1000.0000 [IU]/h | INTRAVENOUS | Status: DC
  Administered 2019-05-13: 950 [IU]/h via INTRAVENOUS
  Filled 2019-05-13: qty 250

## 2019-05-13 MED ORDER — ATORVASTATIN CALCIUM 40 MG PO TABS
40.0000 mg | ORAL_TABLET | Freq: Every day | ORAL | Status: DC
  Administered 2019-05-13: 40 mg via ORAL
  Filled 2019-05-13: qty 1

## 2019-05-13 NOTE — ED Notes (Signed)
Ultrasound at bedside

## 2019-05-13 NOTE — Progress Notes (Signed)
CRITICAL VALUE ALERT  Critical Value:  Troponin 197  Date & Time Notied:  05/13/19 1654  Provider Notified: M[place]  Orders Received/Actions taken: Waiting on response from MD.

## 2019-05-13 NOTE — Progress Notes (Signed)
ANTICOAGULATION CONSULT NOTE - Initial Consult  Pharmacy Consult for Heparin Indication: chest pain/ACS  Allergies  Allergen Reactions  . Zegerid [Omeprazole-Sodium Bicarbonate]     Irregular Heart Beat    Patient Measurements: Height: 5\' 7"  (170.2 cm) Weight: 186 lb (84.4 kg) IBW/kg (Calculated) : 61.6 HEPARIN DW (KG): 79.2  Vital Signs: Temp: 97.8 F (36.6 C) (07/13 0701) Temp Source: Oral (07/13 0701) BP: 139/64 (07/13 1415) Pulse Rate: 70 (07/13 1415)  Labs: Recent Labs    05/13/19 0710  05/13/19 1143 05/13/19 1337 05/13/19 1548  HGB 13.5  --   --   --   --   HCT 40.3  --   --   --   --   PLT 252  --   --   --   --   CREATININE 0.54  --   --   --   --   TROPONINIHS 156.00*   < > 193.00* 187.00* 197.00*   < > = values in this interval not displayed.    Estimated Creatinine Clearance: 78.2 mL/min (by C-G formula based on SCr of 0.54 mg/dL).   Medical History: Past Medical History:  Diagnosis Date  . Anxiety   . Cataracts, bilateral    Gershon Crane  . COPD (chronic obstructive pulmonary disease) ([place])   . Coronary artery disease    cardiac cath 2008  . DM (diabetes mellitus) ([place])    "came off medicines with dr's permission"  . GERD (gastroesophageal reflux disease)   . Hyperlipidemia    stopped meds on her own  . Hypothyroid   . Shortness of breath     Medications:  Medications Prior to Admission  Medication Sig Dispense Refill Last Dose  . cyanocobalamin (,VITAMIN B-12,) 1000 MCG/ML injection Inject 1 mL into the muscle every 30 (thirty) days.   Past Week at Unknown time  . LORazepam (ATIVAN) 1 MG tablet Take 1 mg by mouth 4 (four) times daily.   05/12/2019 at Unknown time  . PROAIR HFA 108 (90 Base) MCG/ACT inhaler Inhale 1 puff into the lungs 3 (three) times daily.   05/12/2019 at Unknown time  . SYMBICORT 160-4.5 MCG/ACT inhaler Inhale 1 puff into the lungs daily.   05/12/2019 at Unknown time  . Thyroid (WP THYROID) 113.75 MG TABS Take 1 tab daily in  AM. **PT NEEDS LAB APPT** (Patient taking differently: Take 97.5 mg by mouth daily. Take 1 tab daily in AM. **PT NEEDS LAB APPT**) 45 tablet 1 05/13/2019 at Unknown time  . B COMPLEX-C-FOLIC ACID ER PO Take 1 capsule by mouth daily.   Not Taking at Unknown time    Assessment: Patient complaining of chest pain.  Troponin was elevated in the range of 156, 167. Pharmacy asked to start heparin. Patient received heparin sq 5000 units at 1530.   Goal of Therapy:  Heparin level 0.3-0.7 units/ml Monitor platelets by anticoagulation protocol: Yes   Plan:  Give 2000 units bolus x 1 Start heparin infusion at 950 units/hr Check anti-Xa level in ~6-8 hours and daily while on heparin Continue to monitor H&H and platelets  Isac Sarna, BS Vena Austria, BCPS Clinical Pharmacist Pager [phone] 05/13/2019,6:28 PM

## 2019-05-13 NOTE — ED Notes (Signed)
Date and time results received: 05/13/19 0749 (use smartphrase ".now" to insert current time)  Test: trop Critical Value: 156  Name of Provider Notified: dr Thurnell Garbe  Orders Received? Or Actions Taken?: see chart

## 2019-05-13 NOTE — ED Notes (Addendum)
Date and time results received: 05/13/19 12:33 PM  Test: troponin Critical Value: 193  Name of Provider Notified: dr. Thurnell Garbe  Orders Received? Or Actions Taken? See orders

## 2019-05-13 NOTE — ED Notes (Signed)
Date and time results received: 05/13/19 9:41 AM  (use smartphrase ".now" to insert current time)  Test: Troponin Critical Value: 167.2  Name of Provider Notified: Thurnell Garbe  Orders Received? Or Actions Taken?: Orders Received - See Orders for details

## 2019-05-13 NOTE — ED Triage Notes (Signed)
Pt c/o left side chest pain that started last night with radiation to left arm, sob and "sweaty" pt took Rolaids with no change in pain,

## 2019-05-13 NOTE — ED Provider Notes (Signed)
[facility]Y [facility] Provider Note   CSN: [id number] Arrival date & time: 05/13/19  [id number]     History   Chief Complaint Chief Complaint  Patient presents with   Chest Pain    HPI Tabitha Schmidt is a 63 y.o. female.     HPI  Pt was seen at 0715. Per pt, c/o gradual onset and resolution of one episode of chest "pains" that started last night approximately 2100/2130. Pt states the CP began while she was performing ADL's before going to bed. Pt describes the CP as "aching," with radiation into her left arm. Pt states her CP has slowly improved since arrival to the ED. Pt states she felt SOB yesterday, but describes this as "burning when she took a breath in." Pt took a Rolaids last night without change in her symptoms. Denies injury, no rash, no palpitations, no cough, no abd pain, no N/V/D, no back pain, no fevers.     Past Medical History:  Diagnosis Date   Anxiety    Cataracts, bilateral    Nile RiggsShapiro   COPD (chronic obstructive pulmonary disease) (HCC)    Coronary artery disease    cardiac cath 2010   DM (diabetes mellitus) (HCC)    "came off medicines with dr's permission"   GERD (gastroesophageal reflux disease)    Hyperlipidemia    stopped meds on her own   Hypothyroid    Shortness of breath     Patient Active Problem List   Diagnosis Date Noted   Hyperglycemia 07/10/2015   Vitamin B12 deficiency 05/06/2015   Vitamin D deficiency 05/06/2015   Chronic fatigue 12/16/2014   Hypothyroidism 12/16/2014   GERD (gastroesophageal reflux disease) 01/03/2012   Plantar fasciitis 05/26/2011   PLANTAR FACIITIS 11/24/2010    Past Surgical History:  Procedure Laterality Date   ABDOMINAL HYSTERECTOMY     BREAST SURGERY     removal of bilateral benign tumors   CARDIAC CATHETERIZATION     2010   CHOLECYSTECTOMY     DILATION AND CURETTAGE OF UTERUS     ESOPHAGOGASTRODUODENOSCOPY  01/26/2012   Procedure: ESOPHAGOGASTRODUODENOSCOPY (EGD);   Surgeon: Malissa HippoNajeeb U Rehman, MD;  Location: [facility] ENDO SUITE;  Service: Endoscopy;  Laterality: N/A;  730   TUBAL LIGATION       OB History   No obstetric history on file.      Home Medications    Prior to Admission medications   Medication Sig Start Date End Date Taking? Authorizing Provider  B COMPLEX-C-FOLIC ACID ER PO Take 1 capsule by mouth daily.    [provider]  escitalopram (LEXAPRO) 10 MG tablet Take 10 mg by mouth daily.    [provider]  LORazepam (ATIVAN) 1 MG tablet Take 1 mg by mouth 4 (four) times daily.    [provider]  oseltamivir (TAMIFLU) 75 MG capsule Take 1 capsule (75 mg total) by mouth every 12 (twelve) hours. 01/05/19   Raeford RazorKohut, Stephen, MD  predniSONE (DELTASONE) 20 MG tablet Take 2 tablets (40 mg total) by mouth daily. 01/05/19   Raeford RazorKohut, Stephen, MD  Thyroid [facility](WP THYROID) 113.75 MG TABS Take 1 tab daily in AM. **PT NEEDS LAB APPT** Patient taking differently: Take 97.5 mg by mouth daily. Take 1 tab daily in AM. **PT NEEDS LAB APPT** 04/04/16   Carlus PavlovGherghe, Cristina, MD    Family History Family History  Problem Relation Age of Onset   Anesthesia problems Neg Hx    Hypotension Neg Hx    Malignant  hyperthermia Neg Hx    Pseudochol deficiency Neg Hx     Social History Social History   Tobacco Use   Smoking status: Current Every Day Smoker    Packs/day: 0.50    Years: 40.00    Pack years: 20.00    Types: Cigarettes   Smokeless tobacco: Never Used  Substance Use Topics   Alcohol use: No   Drug use: No    Types: Cocaine     Allergies   Zegerid [omeprazole-sodium bicarbonate]   Review of Systems Review of Systems ROS: Statement: All systems negative except as marked or noted in the HPI; Constitutional: Negative for fever and chills. ; ; Eyes: Negative for eye pain, redness and discharge. ; ; ENMT: Negative for ear pain, hoarseness, nasal congestion, sinus pressure and sore throat. ; ; Cardiovascular: +CP. Negative for  palpitations, diaphoresis, dyspnea and peripheral edema. ; ; Respiratory: Negative for cough, wheezing and stridor. ; ; Gastrointestinal: Negative for nausea, vomiting, diarrhea, abdominal pain, blood in stool, hematemesis, jaundice and rectal bleeding. . ; ; Genitourinary: Negative for dysuria, flank pain and hematuria. ; ; Musculoskeletal: Negative for back pain and neck pain. Negative for swelling and trauma.; ; Skin: Negative for pruritus, rash, abrasions, blisters, bruising and skin lesion.; ; Neuro: Negative for headache, lightheadedness and neck stiffness. Negative for weakness, altered level of consciousness, altered mental status, extremity weakness, paresthesias, involuntary movement, seizure and syncope.       Physical Exam Updated Vital Signs BP (!) 146/78    Pulse 68    Temp 97.8 F (36.6 C) (Oral)    Resp 16    Ht 5' 6.5" (1.689 m)    Wt 81.6 kg    SpO2 100%    BMI 28.62 kg/m   Physical Exam 0720: Physical examination:  Nursing notes reviewed; Vital signs and O2 SAT reviewed;  Constitutional: Well developed, Well nourished, Well hydrated, In no acute distress; Head:  Normocephalic, atraumatic; Eyes: EOMI, PERRL, No scleral icterus; ENMT: Mouth and pharynx normal, Mucous membranes moist; Neck: Supple, Full range of motion, No lymphadenopathy; Cardiovascular: Regular rate and rhythm, No gallop; Respiratory: Breath sounds clear & equal bilaterally, No wheezes.  Speaking full sentences with ease, Normal respiratory effort/excursion; Chest: Nontender, Movement normal; Abdomen: Soft, Nontender, Nondistended, Normal bowel sounds; Genitourinary: No CVA tenderness; Extremities: Peripheral pulses normal, No tenderness, No edema, No calf edema or asymmetry.; Neuro: AA&Ox3, Major CN grossly intact.  Speech clear. No gross focal motor or sensory deficits in extremities.; Skin: Color normal, Warm, Dry.   ED Treatments / Results  Labs (all labs ordered are listed, but only abnormal results are  displayed)   EKG EKG Interpretation  Date/Time:  Monday May 13 2019 06:57:48 EDT  #1 EKG Ventricular Rate:  69 PR Interval:    QRS Duration: 87 QT Interval:  392 QTC Calculation: 420 R Axis:   75 Text Interpretation:  Sinus rhythm No significant change was found Confirmed by Glynn Octaveancour, Stephen [phone]) on 05/13/2019 7:06:01 AM    EKG Interpretation  Date/Time:  Monday May 13 2019 09:20:41 EDT   #2 EKG Ventricular Rate:  63 PR Interval:    QRS Duration: 86 QT Interval:  438 QTC Calculation: 449 R Axis:   73 Text Interpretation:  Sinus rhythm Since last tracing of earlier today No significant change was found Confirmed by Samuel JesterMcManus, Hughes Wyndham [phone]([id number]) on 05/13/2019 9:34:20 AM           Radiology   Procedures Procedures (including critical care time)  Medications  Ordered in ED Medications  aspirin chewable tablet 324 mg (324 mg Oral Given 05/13/19 0730)  acetaminophen (TYLENOL) tablet 650 mg (650 mg Oral Given 05/13/19 0730)     Initial Impression / Assessment and Plan / ED Course  I have reviewed the triage vital signs and the nursing notes.  Pertinent labs & imaging results that were available during my care of the patient were reviewed by me and considered in my medical decision making (see chart for details).     MDM Reviewed: previous chart, nursing note and vitals Reviewed previous: labs and ECG Interpretation: labs, ECG and x-ray Total time providing critical care: 30-74 minutes. This excludes time spent performing separately reportable procedures and services. Consults: cardiology and admitting MD   CRITICAL CARE Performed by: Francine Graven Total critical care time: 35 minutes Critical care time was exclusive of separately billable procedures and treating other patients. Critical care was necessary to treat or prevent imminent or life-threatening deterioration. Critical care was time spent personally by me on the following activities: development of  treatment plan with patient and/or surrogate as well as nursing, discussions with consultants, evaluation of patient's response to treatment, examination of patient, obtaining history from patient or surrogate, ordering and performing treatments and interventions, ordering and review of laboratory studies, ordering and review of radiographic studies, pulse oximetry and re-evaluation of patient's condition.    Results for orders placed or performed during the hospital encounter of 31/51/76  Basic metabolic panel  Result Value Ref Range   Sodium 140 135 - 145 mmol/L   Potassium 3.9 3.5 - 5.1 mmol/L   Chloride 105 98 - 111 mmol/L   CO2 23 22 - 32 mmol/L   Glucose, Bld 106 (H) 70 - 99 mg/dL   BUN 20 8 - 23 mg/dL   Creatinine, Ser 0.54 0.44 - 1.00 mg/dL   Calcium 9.8 8.9 - 10.3 mg/dL   GFR calc non Af Amer >60 >60 mL/min   GFR calc Af Amer >60 >60 mL/min   Anion gap 12 5 - 15  CBC with Differential  Result Value Ref Range   WBC 5.9 4.0 - 10.5 K/uL   RBC 4.35 3.87 - 5.11 MIL/uL   Hemoglobin 13.5 12.0 - 15.0 g/dL   HCT 40.3 36.0 - 46.0 %   MCV 92.6 80.0 - 100.0 fL   MCH 31.0 26.0 - 34.0 pg   MCHC 33.5 30.0 - 36.0 g/dL   RDW 12.3 11.5 - 15.5 %   Platelets 252 150 - 400 K/uL   nRBC 0.0 0.0 - 0.2 %   Neutrophils Relative % 42 %   Neutro Abs 2.5 1.7 - 7.7 K/uL   Lymphocytes Relative 42 %   Lymphs Abs 2.5 0.7 - 4.0 K/uL   Monocytes Relative 12 %   Monocytes Absolute 0.7 0.1 - 1.0 K/uL   Eosinophils Relative 3 %   Eosinophils Absolute 0.2 0.0 - 0.5 K/uL   Basophils Relative 1 %   Basophils Absolute 0.0 0.0 - 0.1 K/uL   Immature Granulocytes 0 %   Abs Immature Granulocytes 0.01 0.00 - 0.07 K/uL  D-dimer, quantitative  Result Value Ref Range   D-Dimer, Quant 0.51 (H) 0.00 - 0.50 ug/mL-FEU  Urinalysis, Routine w reflex microscopic  Result Value Ref Range   Color, Urine STRAW (A) YELLOW   APPearance CLEAR CLEAR   Specific Gravity, Urine 1.006 1.005 - 1.030   pH 7.0 5.0 - 8.0    Glucose, UA NEGATIVE NEGATIVE mg/dL  Hgb urine dipstick NEGATIVE NEGATIVE   Bilirubin Urine NEGATIVE NEGATIVE   Ketones, ur NEGATIVE NEGATIVE mg/dL   Protein, ur NEGATIVE NEGATIVE mg/dL   Nitrite NEGATIVE NEGATIVE   Leukocytes,Ua NEGATIVE NEGATIVE  Troponin I (High Sensitivity)  Result Value Ref Range   Troponin I (High Sensitivity) 156.00 (HH) <18 ng/L  Troponin I (High Sensitivity)  Result Value Ref Range   Troponin I (High Sensitivity) 167.00 (HH) <18 ng/L   Dg Chest 2 View Result Date: 05/13/2019 CLINICAL DATA:  Chest pain into left arm with some sob since last night/copd/diabetic/smoker/hx heart cath EXAM: CHEST - 2 VIEW COMPARISON:  02/18/2019. FINDINGS: Cardiac silhouette is normal in size. No mediastinal or hilar masses. No evidence of adenopathy. Lungs are hyperexpanded. Stable scarring in the upper lobes. No evidence of pneumonia or pulmonary edema. No pleural effusion or pneumothorax. Skeletal structures are intact. IMPRESSION: 1. COPD without acute cardiopulmonary disease. Electronically Signed   By: Amie Portland M.D.   On: 05/13/2019 08:06   Tabitha Schmidt was evaluated in Emergency Department on 05/13/2019 for the symptoms described in the history of present illness. She was evaluated in the context of the global COVID-19 pandemic, which necessitated consideration that the patient might be at risk for infection with the SARS-CoV-2 virus that causes COVID-19. Institutional protocols and algorithms that pertain to the evaluation of patients at risk for COVID-19 are in a state of rapid change based on information released by regulatory bodies including the CDC and federal and state organizations. These policies and algorithms were followed during the patient's care in the ED.    0820:  Age adjusted D-dimer below cut of (<0.65). Heart score 5. EKG unchanged from previous and pt is now CP free.  T/C returned from [facility] Dr. [name], case discussed, including:  HPI, pertinent PM/SHx,  VS/PE, dx testing, ED course and treatment:  Requests to obtain delta trop and c/b.  Pt updated and agreeable with plan.   1105:  2nd troponin continues elevated, EKG unchanged and without acute STTW changes. T/C returned from [facility] Dr. [name], case discussed, including:  HPI, pertinent PM/SHx, VS/PE, dx testing, ED course and treatment:  Agreeable to consult, requests to obtain Echo, admit to [facility] service.  1120:  Pt continues to deny CP; states she continues to feel "better." Dx and testing, as well as d/w Cards MD, d/w pt.  Questions answered.  Verb understanding, agreeable to admit. T/C returned from [facility] Dr. Gwenlyn Perking, case discussed, including:  HPI, pertinent PM/SHx, VS/PE, dx testing, ED course and treatment:  Agreeable to admit.      Final Clinical Impressions(s) / ED Diagnoses   Final diagnoses:  None    ED Discharge Orders    None       Samuel Jester, DO 05/14/19 1658

## 2019-05-13 NOTE — Consult Note (Addendum)
Cardiology Consultation:   Patient ID: Tabitha Schmidt MRN: [id number]; DOB: 04/11/1956  Admit date: 05/13/2019 Date of Consult: 05/13/2019  Primary Care Provider: Kari BaarsHawkins, Edward, MD Primary Cardiologist: No primary care provider on file.  Primary Electrophysiologist:  None    Patient Profile:   Tabitha Schmidt is a 63 y.o. female with a hx of COPD who is being seen today for the evaluation of chest pain and troponin elevation at the request of Dr. Clarene DukeMcManus.  History of Present Illness:   Tabitha Schmidt is a 63 year old woman with a history of tobacco abuse, coronary artery disease, and COPD.  She developed some precordial pain yesterday which she rated 3/10.  She took some Rolaids and then went about doing her normal activities.  The pain recurred at about 9 PM and this time was slightly worse.  She walked around her bedroom and try to get comfortable and then went to sleep.  She then had a more significant recurrence of pain earlier this morning with some radiation down the left arm.  She came to the ED and was given 4 baby aspirin and since that time she has had no recurrence of symptoms.  She smokes between 1/2 pack to a full pack of cigarettes daily.  She began smoking at the age of 63 and quit for 10 years in between.  Chest x-ray showed COPD but no acute abnormalities.  ECG showed normal sinus rhythm without any acute ischemic ST segment or T wave abnormalities.  High-sensitivity troponins: 156, 167.  D-dimer marginally elevated at 0.51.  CBC is normal and renal function is normal.  Heart Pathway Score:     Past Medical History:  Diagnosis Date  . Anxiety   . Cataracts, bilateral    Nile RiggsShapiro  . COPD (chronic obstructive pulmonary disease) (HCC)   . Coronary artery disease    cardiac cath 2008  . DM (diabetes mellitus) (HCC)    "came off medicines with dr's permission"  . GERD (gastroesophageal reflux disease)   . Hyperlipidemia    stopped meds on her own  . Hypothyroid    . Shortness of breath     Past Surgical History:  Procedure Laterality Date  . ABDOMINAL HYSTERECTOMY    . BREAST SURGERY     removal of bilateral benign tumors  . CARDIAC CATHETERIZATION     2010  . CHOLECYSTECTOMY    . DILATION AND CURETTAGE OF UTERUS    . ESOPHAGOGASTRODUODENOSCOPY  01/26/2012   Procedure: ESOPHAGOGASTRODUODENOSCOPY (EGD);  Surgeon: Malissa HippoNajeeb U Rehman, MD;  Location: [facility];  Service: Endoscopy;  Laterality: N/A;  730  . TUBAL LIGATION       Home Medications:  Prior to Admission medications   Medication Sig Start Date End Date Taking? Authorizing Provider  cyanocobalamin (,VITAMIN B-12,) 1000 MCG/ML injection Inject 1 mL into the muscle every 30 (thirty) days. 11/22/18  Yes [provider]  LORazepam (ATIVAN) 1 MG tablet Take 1 mg by mouth 4 (four) times daily.   Yes [provider]  PROAIR HFA 108 (90 Base) MCG/ACT inhaler Inhale 1 puff into the lungs 3 (three) times daily. 01/15/19  Yes [provider]  SYMBICORT 160-4.5 MCG/ACT inhaler Inhale 1 puff into the lungs daily. 01/15/19  Yes [provider]  Thyroid (WP THYROID) 113.75 MG TABS Take 1 tab daily in AM. **PT NEEDS LAB APPT** Patient taking differently: Take 97.5 mg by mouth daily. Take 1 tab daily in AM. **PT NEEDS LAB APPT** 04/04/16  Yes  Carlus Pavlov, MD  B COMPLEX-C-FOLIC ACID ER PO Take 1 capsule by mouth daily.    [provider]  escitalopram (LEXAPRO) 10 MG tablet Take 10 mg by mouth daily.    [provider]    Inpatient Medications: Scheduled Meds:  Continuous Infusions:  PRN Meds:   Allergies:    Allergies  Allergen Reactions  . Zegerid [Omeprazole-Sodium Bicarbonate]     Irregular Heart Beat    Social History:   Social History   Socioeconomic History  . Marital status: Single    Spouse name: Not on file  . Number of children: Not on file  . Years of education: Not on file  . Highest education level: Not on file   Occupational History  . Not on file  Social Needs  . Financial resource strain: Not on file  . Food insecurity    Worry: Not on file    Inability: Not on file  . Transportation needs    Medical: Not on file    Non-medical: Not on file  Tobacco Use  . Smoking status: Current Every Day Smoker    Packs/day: 0.50    Years: 40.00    Pack years: 20.00    Types: Cigarettes  . Smokeless tobacco: Never Used  Substance and Sexual Activity  . Alcohol use: No  . Drug use: No    Types: Cocaine  . Sexual activity: Yes    Birth control/protection: Surgical  Lifestyle  . Physical activity    Days per week: Not on file    Minutes per session: Not on file  . Stress: Not on file  Relationships  . Social Musician on phone: Not on file    Gets together: Not on file    Attends religious service: Not on file    Active member of club or organization: Not on file    Attends meetings of clubs or organizations: Not on file    Relationship status: Not on file  . Intimate partner violence    Fear of current or ex partner: Not on file    Emotionally abused: Not on file    Physically abused: Not on file    Forced sexual activity: Not on file  Other Topics Concern  . Not on file  Social History Narrative  . Not on file    Family History:    Family History  Problem Relation Age of Onset  . Anesthesia problems Neg Hx   . Hypotension Neg Hx   . Malignant hyperthermia Neg Hx   . Pseudochol deficiency Neg Hx      ROS:  Please see the history of present illness.   All other ROS reviewed and negative.     Physical Exam/Data:   Vitals:   05/13/19 0701 05/13/19 0702  BP: (!) 146/78   Pulse: 68   Resp: 16   Temp: 97.8 F (36.6 C)   TempSrc: Oral   SpO2: 100%   Weight:  81.6 kg  Height:  5' 6.5" (1.689 m)   No intake or output data in the 24 hours ending 05/13/19 1123 Last 3 Weights 05/13/2019 01/05/2019 01/05/2016  Weight (lbs) 180 lb 165 lb 192 lb 12.8 oz  Weight (kg)  81.647 kg 74.844 kg 87.454 kg     Body mass index is 28.62 kg/m.  General:  Well nourished, well developed, in no acute distress HEENT: normal Lymph: no adenopathy Neck: no JVD Endocrine:  No thryomegaly Vascular: Right carotid bruit  Cardiac:  normal S1, S2; RRR; no murmur  Lungs:  clear to auscultation bilaterally, no wheezing, rhonchi or rales  Abd: soft, nontender, no hepatomegaly  Ext: no edema Musculoskeletal:  No deformities, BUE and BLE strength normal and equal Skin: warm and dry  Neuro:  CNs 2-12 intact, no focal abnormalities noted Psych:  Normal affect   EKG:  The EKG was personally reviewed and demonstrates: Normal sinus rhythm with no ischemic ST segment or T wave abnormalities. Telemetry:  Telemetry was personally reviewed and demonstrates: Sinus rhythm  Relevant CV Studies:  Cardiac catheterization 12/25/2006:  HEMODYNAMIC RESULTS:  1. Aortic systolic pressure 950, diastolic pressure 64.  2. Left ventricular systolic pressure 932, end-diastolic pressure 14.   SELECTIVE CORONARY ANGIOGRAPHY:  1. Left main normal.  2. LAD:  LAD gave off a large first diagonal branch which had a 60%      segmental proximal stenosis.  3. Left circumflex; non-dominant and free of significant disease.  4. Right coronary artery; dominant and free of significant disease.  5. Left ventriculography; RAO left ventriculogram performed using 25      mL of Visipaque dye at 12 mL per second.  The overall LVEF was      estimated greater than 60% without focal wall motion abnormalities.  6. Distal abdominal aortography; performed using 20 mL of Visipaque      dye at 20 mL per second.  There appear to be a ostial left renal      artery stenosis.  Infrarenal abdominal aorta and iliac bifurcation      appear free of significant atherosclerotic changes.  Laboratory Data:  High Sensitivity Troponin:   Recent Labs  Lab 05/13/19 0710 05/13/19 0905  TROPONINIHS 156.00* 167.00*     Cardiac  EnzymesNo results for input(s): TROPONINI in the last 168 hours. No results for input(s): TROPIPOC in the last 168 hours.  Chemistry Recent Labs  Lab 05/13/19 0710  NA 140  K 3.9  CL 105  CO2 23  GLUCOSE 106*  BUN 20  CREATININE 0.54  CALCIUM 9.8  GFRNONAA >60  GFRAA >60  ANIONGAP 12    No results for input(s): PROT, ALBUMIN, AST, ALT, ALKPHOS, BILITOT in the last 168 hours. Hematology Recent Labs  Lab 05/13/19 0710  WBC 5.9  RBC 4.35  HGB 13.5  HCT 40.3  MCV 92.6  MCH 31.0  MCHC 33.5  RDW 12.3  PLT 252   BNPNo results for input(s): BNP, PROBNP in the last 168 hours.  DDimer  Recent Labs  Lab 05/13/19 0710  DDIMER 0.51*     Radiology/Studies:  Dg Chest 2 View  Result Date: 05/13/2019 CLINICAL DATA:  Chest pain into left arm with some sob since last night/copd/diabetic/smoker/hx heart cath EXAM: CHEST - 2 VIEW COMPARISON:  02/18/2019. FINDINGS: Cardiac silhouette is normal in size. No mediastinal or hilar masses. No evidence of adenopathy. Lungs are hyperexpanded. Stable scarring in the upper lobes. No evidence of pneumonia or pulmonary edema. No pleural effusion or pneumothorax. Skeletal structures are intact. IMPRESSION: 1. COPD without acute cardiopulmonary disease. Electronically Signed   By: Lajean Manes M.D.   On: 05/13/2019 08:06    Assessment and Plan:   1.  Chest pain and troponin elevation and coronary artery disease: She is presently asymptomatic with a normal ECG.  While high-sensitivity troponins are elevated, this level can still represent a nonspecific elevation.  She did have a 60% diagonal lesion and a large diagonal branch in 2008.  She also  has risk factors which include tobacco abuse.   I spoke with the ED physician.  I have recommended obtaining an echocardiogram to evaluate cardiac structure and function.  I recommend continuing to trend troponins.   Aspirin has been started and I will start Lopressor 25 mg twice daily and atorvastatin 40 mg  daily preemptively.  A lipid panel and hemoglobin A1c has been ordered. If troponins remain stable or trend down and she remains asymptomatic, I will arrange for a nuclear stress test on 05/14/2019.  If she were to develop recurrence of symptoms with troponins continuing to significantly rise and/or abnormal echocardiogram, I would recommend coronary angiography. This was discussed with the patient and she is in agreement with this plan.  2.  Tobacco use: Currently smoking 1/2 pack to 1 pack of cigarettes daily and has done so since the age of 89 with a 10-year.  Where she did not smoke.  3.  COPD: Symptoms appear to be stable.  She needs tobacco cessation.  4. Right carotid bruit: I will obtain Dopplers. On ASA. I am starting statin.     For questions or updates, please contact [facility] Please consult [email or web address] for contact info under     Signed, Prentice DockerSuresh Gareld Obrecht, MD  05/13/2019 11:23 AM

## 2019-05-13 NOTE — Progress Notes (Signed)
*  PRELIMINARY RESULTS* Echocardiogram 2D Echocardiogram has been performed.  Tabitha Schmidt 05/13/2019, 12:54 PM

## 2019-05-13 NOTE — H&P (Signed)
History and Physical    Tabitha Schmidt:[id number] DOB: 1955-11-15 DOA: 05/13/2019  Referring MD/NP/PA: Dr. Clarene Duke. PCP: Kari Baars, MD  Patient coming from: Home  Chief Complaint: Chest pain  HPI: Tabitha Schmidt is a 63 y.o. female with a past medical history significant for anxiety, COPD, prior history of coronary artery disease (nonobstructive), type 2 diabetes mellitus, gastroesophageal reflux disease, hyperlipidemia, tobacco abuse and hypothyroidism; who presented to the emergency department secondary to chest pain.  Patient reports on precordial chest pain the day prior to admission which she rated 3-4 out of 10 in intensity.  Patient reports taking some antiacid over-the-counter without any improvement.  Pain was localized in the middle of her chest radiated to the left side and left upper extremity.  She reports having the symptoms worse while engaging into activity and expressed having some associated nausea/diaphoresis.   She denies fever, chills, vomiting, dysuria, hematuria, abdominal pain, headaches, focal weakness, sick contacts or any other complaints.  Patient presented to the emergency department for further evaluation and management; patient received 4 baby aspirin with complete resolution of her chest discomfort since then.  In the ED chest x-ray demonstrated chronic COPD changes but no acute cardiopulmonary process.  Troponin was elevated in the range of 156, 167.  EKG demonstrated sinus rhythm without acute ischemic abnormalities.  Given patient risk factors and a heart score of 5 cardiology was consulted and TRH called to place patient in observation for further evaluation and management.  Past Medical/Surgical History: Past Medical History:  Diagnosis Date   Anxiety    Cataracts, bilateral    [name]   COPD (chronic obstructive pulmonary disease) (HCC)    Coronary artery disease    cardiac cath 2008   DM (diabetes mellitus) (HCC)    "came off medicines with  dr's permission"   GERD (gastroesophageal reflux disease)    Hyperlipidemia    stopped meds on her own   Hypothyroid    Shortness of breath     Past Surgical History:  Procedure Laterality Date   ABDOMINAL HYSTERECTOMY     BREAST SURGERY     removal of bilateral benign tumors   CARDIAC CATHETERIZATION     2010   CHOLECYSTECTOMY     DILATION AND CURETTAGE OF UTERUS     ESOPHAGOGASTRODUODENOSCOPY  01/26/2012   Procedure: ESOPHAGOGASTRODUODENOSCOPY (EGD);  Surgeon: Malissa Hippo, MD;  Location: [facility];  Service: Endoscopy;  Laterality: N/A;  730   TUBAL LIGATION      Social History:  reports that she has been smoking cigarettes. She has a 20.00 pack-year smoking history. She has never used smokeless tobacco. She reports that she does not drink alcohol or use drugs.  Allergies: Allergies  Allergen Reactions   Zegerid [Omeprazole-Sodium Bicarbonate]     Irregular Heart Beat    Family History:  Family History  Problem Relation Age of Onset   Anesthesia problems Neg Hx    Hypotension Neg Hx    Malignant hyperthermia Neg Hx    Pseudochol deficiency Neg Hx     Prior to Admission medications   Medication Sig Start Date End Date Taking? Authorizing Provider  cyanocobalamin (,VITAMIN B-12,) 1000 MCG/ML injection Inject 1 mL into the muscle every 30 (thirty) days. 11/22/18  Yes [provider]  LORazepam (ATIVAN) 1 MG tablet Take 1 mg by mouth 4 (four) times daily.   Yes [provider]  PROAIR HFA 108 (90 Base) MCG/ACT inhaler Inhale 1 puff into the lungs  3 (three) times daily. 01/15/19  Yes [provider]  SYMBICORT 160-4.5 MCG/ACT inhaler Inhale 1 puff into the lungs daily. 01/15/19  Yes [provider]  Thyroid (WP THYROID) 113.75 MG TABS Take 1 tab daily in AM. **PT NEEDS LAB APPT** Patient taking differently: Take 97.5 mg by mouth daily. Take 1 tab daily in AM. **PT NEEDS LAB APPT** 04/04/16  Yes Carlus PavlovGherghe, Cristina, MD    B COMPLEX-C-FOLIC ACID ER PO Take 1 capsule by mouth daily.    [provider]  escitalopram (LEXAPRO) 10 MG tablet Take 10 mg by mouth daily.    [provider]    Review of Systems:  Negative except as otherwise mentioned in HPI.  Physical Exam: Vitals:   05/13/19 1130 05/13/19 1146 05/13/19 1300 05/13/19 1415  BP: (!) 145/92 (!) 145/92 140/88 139/64  Pulse: 77 69 60 70  Resp: (!) 27 18 14 16   Temp:      TempSrc:      SpO2: 99% 100% 98% 98%  Weight:    84.4 kg  Height:    5\' 7"  (1.702 m)   Constitutional: Reports some dull discomfort with activity on her chest; no fever, no shortness of breath, no orthopnea, no acute distress.  Speaking in full sentences. Eyes: PERRL, lids and conjunctivae normal; no icterus, no nystagmus. ENMT: Mucous membranes are moist. Posterior pharynx clear of any exudate or lesions. Neck: normal, supple, no masses, no thyromegaly, no JVD. Respiratory: Positive rhonchi right, no wheezing, no crackles, no using accessory muscles.  Normal respiratory effort. Cardiovascular: Regular rate and rhythm, no murmurs / rubs / gallops. No extremity edema. 2+ pedal pulses. No carotid bruits.  Abdomen: no tenderness, no masses palpated. No hepatosplenomegaly. Bowel sounds positive.  Musculoskeletal: no clubbing / cyanosis. No joint deformity upper and lower extremities. Good ROM, no contractures. Normal muscle tone.  Skin: no rashes, lesions, ulcers. No induration Neurologic: CN 2-12 grossly intact. Sensation intact, DTR normal. Strength 5/5 in all 4.  Psychiatric: Normal judgment and insight. Alert and oriented x 3. Normal mood.    Labs on Admission: I have personally reviewed the following labs and imaging studies  CBC: Recent Labs  Lab 05/13/19 0710  WBC 5.9  NEUTROABS 2.5  HGB 13.5  HCT 40.3  MCV 92.6  PLT 252   Basic Metabolic Panel: Recent Labs  Lab 05/13/19 0710  NA 140  K 3.9  CL 105  CO2 23  GLUCOSE 106*  BUN 20   CREATININE 0.54  CALCIUM 9.8   GFR: Estimated Creatinine Clearance: 78.2 mL/min (by C-G formula based on SCr of 0.54 mg/dL).  HbA1C: Recent Labs    05/13/19 1143  HGBA1C 5.3   Thyroid Function Tests: Recent Labs    05/13/19 1146  TSH 0.152*   Urine analysis:    Component Value Date/Time   COLORURINE STRAW (A) 05/13/2019 0718   APPEARANCEUR CLEAR 05/13/2019 0718   LABSPEC 1.006 05/13/2019 0718   PHURINE 7.0 05/13/2019 0718   GLUCOSEU NEGATIVE 05/13/2019 0718   HGBUR NEGATIVE 05/13/2019 0718   BILIRUBINUR NEGATIVE 05/13/2019 0718   KETONESUR NEGATIVE 05/13/2019 0718   PROTEINUR NEGATIVE 05/13/2019 0718   UROBILINOGEN 0.2 02/02/2010 0846   NITRITE NEGATIVE 05/13/2019 0718   LEUKOCYTESUR NEGATIVE 05/13/2019 0718    Recent Results (from the past 240 hour(s))  SARS Coronavirus 2 (CEPHEID - Performed in [facility] lab), Hosp Order     Status: None   Collection Time: 05/13/19 11:43 AM   Specimen: Nasopharyngeal  Swab  Result Value Ref Range Status   SARS Coronavirus 2 NEGATIVE NEGATIVE Final    Comment: (NOTE) If result is NEGATIVE SARS-CoV-2 target nucleic acids are NOT DETECTED. The SARS-CoV-2 RNA is generally detectable in upper and lower  respiratory specimens during the acute phase of infection. The lowest  concentration of SARS-CoV-2 viral copies this assay can detect is 250  copies / mL. A negative result does not preclude SARS-CoV-2 infection  and should not be used as the sole basis for treatment or other  patient management decisions.  A negative result may occur with  improper specimen collection / handling, submission of specimen other  than nasopharyngeal swab, presence of viral mutation(s) within the  areas targeted by this assay, and inadequate number of viral copies  (<250 copies / mL). A negative result must be combined with clinical  observations, patient history, and epidemiological information. If result is POSITIVE SARS-CoV-2 target  nucleic acids are DETECTED. The SARS-CoV-2 RNA is generally detectable in upper and lower  respiratory specimens dur ing the acute phase of infection.  Positive  results are indicative of active infection with SARS-CoV-2.  Clinical  correlation with patient history and other diagnostic information is  necessary to determine patient infection status.  Positive results do  not rule out bacterial infection or co-infection with other viruses. If result is PRESUMPTIVE POSTIVE SARS-CoV-2 nucleic acids MAY BE PRESENT.   A presumptive positive result was obtained on the submitted specimen  and confirmed on repeat testing.  While 2019 novel coronavirus  (SARS-CoV-2) nucleic acids may be present in the submitted sample  additional confirmatory testing may be necessary for epidemiological  and / or clinical management purposes  to differentiate between  SARS-CoV-2 and other Sarbecovirus currently known to infect humans.  If clinically indicated additional testing with an alternate test  methodology [phone]([id number]) is advised. The SARS-CoV-2 RNA is generally  detectable in upper and lower respiratory sp ecimens during the acute  phase of infection. The expected result is Negative. Fact Sheet for Patients:  BoilerBrush.com.cyhttps://www.fda.gov/media/136312/download Fact Sheet for Healthcare Providers: https://pope.com/https://www.fda.gov/media/136313/download This test is not yet approved or cleared by the [place] FDA and has been authorized for detection and/or diagnosis of SARS-CoV-2 by FDA under an Emergency Use Authorization (EUA).  This EUA will remain in effect (meaning this test can be used) for the duration of the COVID-19 declaration under Section 564(b)(1) of the Act, 21 U.S.C. section 360bbb-3(b)(1), unless the authorization is terminated or revoked sooner. Performed at [facility], [address] [place], [place] [address]      Radiological Exams on Admission: Dg Chest 2 View  Result Date: 05/13/2019 CLINICAL  DATA:  Chest pain into left arm with some sob since last night/copd/diabetic/smoker/hx heart cath EXAM: CHEST - 2 VIEW COMPARISON:  02/18/2019. FINDINGS: Cardiac silhouette is normal in size. No mediastinal or hilar masses. No evidence of adenopathy. Lungs are hyperexpanded. Stable scarring in the upper lobes. No evidence of pneumonia or pulmonary edema. No pleural effusion or pneumothorax. Skeletal structures are intact. IMPRESSION: 1. COPD without acute cardiopulmonary disease. Electronically Signed   By: Amie Portlandavid  Ormond M.D.   On: 05/13/2019 08:06   [place] Carotid Bilateral  Result Date: 05/13/2019 CLINICAL DATA:  63 year old female with a right-sided carotid bruit EXAM: BILATERAL CAROTID DUPLEX ULTRASOUND TECHNIQUE: Wallace CullensGray scale imaging, color Doppler and duplex ultrasound were performed of bilateral carotid and vertebral arteries in the neck. COMPARISON:  None. FINDINGS: Criteria: Quantification of carotid stenosis is based on velocity parameters that  correlate the residual internal carotid diameter with NASCET-based stenosis levels, using the diameter of the distal internal carotid lumen as the denominator for stenosis measurement. The following velocity measurements were obtained: RIGHT ICA: 447/109 cm/sec CCA: 15/17 cm/sec SYSTOLIC ICA/CCA RATIO:  7.0 ECA:  189 cm/sec LEFT ICA: 193/36 cm/sec CCA: 61/60 cm/sec SYSTOLIC ICA/CCA RATIO:  2.6 ECA:  446 cm/sec RIGHT CAROTID ARTERY: Hypoechoic atherosclerotic plaque in the proximal internal carotid artery results in significant elevation of the peak systolic velocity consistent with a high-grade stenosis. There is evidence of spectral broadening and high velocity jetting. Additionally, there is significant elevation of the peak systolic velocity in the proximal external carotid artery consistent with a greater than 60% stenosis. RIGHT VERTEBRAL ARTERY:  Patent with antegrade flow. LEFT CAROTID ARTERY: Hypoechoic atherosclerotic plaque in the carotid bifurcation  extending into the proximal internal and external carotid arteries. By peak systolic velocity criteria, the internal carotid stenosis falls in the 50-69% diameter range. Additionally, there is significant elevation of the peak systolic velocity in the proximal external carotid artery consistent with a greater than 70% diameter stenosis. LEFT VERTEBRAL ARTERY:  Patent with normal antegrade flow. IMPRESSION: 1. Severe (70-99%) stenosis proximal right internal carotid artery secondary to focal hypoechoic atherosclerotic plaque. 2. Moderate (50-69%) stenosis proximal left internal carotid artery secondary to hypoechoic atherosclerotic plaque. Please note that the degree of stenosis is likely near the lower end of the spectrum secondary to exaggerated compensatory peak systolic velocities in the setting of a high-grade contralateral stenosis. 3. Bilateral external carotid artery stenoses noted incidentally. 4. The vertebral arteries are patent with normal antegrade flow. Signed, Criselda Peaches, MD, [facility] Electronically Signed   By: Jacqulynn Cadet M.D.   On: 05/13/2019 14:34    EKG: Independently reviewed.  Sinus rhythm, no acute ischemic changes appreciated.  Assessment/Plan 1-chest pain/NSTEMI -Patient reports continued having some dull chest discomfort intermittently after admitted to telemetry floor -Her high sensitive troponin trend has remained elevated to a slightly increase (since admission 156, 167, 193, 187, 197). -2D echo reassuring with no wall motion abnormalities and preserved ejection fraction -Cardiology service on board -Patient started on statins, low-dose metoprolol, as needed oxygen supplementation aspirin and heparin drip. -Will follow further recommendation by cardiology service. -will check lipid panel and A1c. -Continue monitoring on telemetry  2-hyperlipidemia -Will check lipid panel -Started on  statins  3-hypothyroidism -Continue Synthroid. -Check TSH  4-gastroesophageal reflux disease -Continue PPI  5-hypertension -Patient was not taking any antihypertensive agent at time of admission -Vital signs overall stable -Check blood pressure.  6-history of COPD/tobacco abuse -Cessation counseling has been provided -Patient declined the use of nicotine -Continue Dulera (equivalent for home Symbicort while inpatient).  7-anxiety -Continue as needed Ativan.  DVT prophylaxis: Heparin drip. Code Status: Full code Family Communication: No family at bedside. Disposition Plan: Anticipate discharge back home once medically stable; ACS work-up completed. Consults called: Cardiology service Admission status: Observation, telemetry bed, length of stay less than 2 midnights.   Time Spent: 70 minutes  Barton Dubois MD [facility] Pager [phone]  05/13/2019, 5:43 PM

## 2019-05-14 ENCOUNTER — Observation Stay (HOSPITAL_BASED_OUTPATIENT_CLINIC_OR_DEPARTMENT_OTHER): Payer: Medicare Other

## 2019-05-14 DIAGNOSIS — J449 Chronic obstructive pulmonary disease, unspecified: Secondary | ICD-10-CM | POA: Diagnosis not present

## 2019-05-14 DIAGNOSIS — R079 Chest pain, unspecified: Secondary | ICD-10-CM

## 2019-05-14 DIAGNOSIS — E785 Hyperlipidemia, unspecified: Secondary | ICD-10-CM | POA: Diagnosis not present

## 2019-05-14 DIAGNOSIS — R778 Other specified abnormalities of plasma proteins: Secondary | ICD-10-CM

## 2019-05-14 DIAGNOSIS — R7989 Other specified abnormal findings of blood chemistry: Secondary | ICD-10-CM | POA: Diagnosis not present

## 2019-05-14 LAB — HEPARIN LEVEL (UNFRACTIONATED)
Heparin Unfractionated: 0.33 IU/mL (ref 0.30–0.70)
Heparin Unfractionated: 0.24 IU/mL — ABNORMAL LOW (ref 0.30–0.70)

## 2019-05-14 LAB — NM MYOCAR MULTI W/SPECT W/WALL MOTION / EF
LV dias vol: 58 mL (ref 46–106)
LV sys vol: 15 mL
Peak HR: 105 {beats}/min
RATE: 0.42
Rest HR: 70 {beats}/min
SDS: 8
SRS: 4
SSS: 12
TID: 1.06

## 2019-05-14 LAB — HIV ANTIBODY (ROUTINE TESTING W REFLEX): HIV Screen 4th Generation wRfx: NONREACTIVE

## 2019-05-14 LAB — LIPID PANEL
Cholesterol: 181 mg/dL (ref 0–200)
HDL: 35 mg/dL — ABNORMAL LOW (ref >40)
LDL Cholesterol: 113 mg/dL — ABNORMAL HIGH (ref 0–99)
Total CHOL/HDL Ratio: 5.2 RATIO
Triglycerides: 167 mg/dL — ABNORMAL HIGH (ref <150)
VLDL: 33 mg/dL (ref 0–40)

## 2019-05-14 LAB — CBC
HCT: 40.3 % (ref 36.0–46.0)
Hemoglobin: 13.1 g/dL (ref 12.0–15.0)
MCH: 31.1 pg (ref 26.0–34.0)
MCHC: 32.5 g/dL (ref 30.0–36.0)
MCV: 95.7 fL (ref 80.0–100.0)
Platelets: 248 10*3/uL (ref 150–400)
RBC: 4.21 MIL/uL (ref 3.87–5.11)
RDW: 12.2 % (ref 11.5–15.5)
WBC: 6.8 10*3/uL (ref 4.0–10.5)
nRBC: 0 % (ref 0.0–0.2)

## 2019-05-14 MED ORDER — SODIUM CHLORIDE 0.9% FLUSH
INTRAVENOUS | Status: AC
  Administered 2019-05-14: 10 mL via INTRAVENOUS
  Filled 2019-05-14: qty 10

## 2019-05-14 MED ORDER — METOPROLOL TARTRATE 25 MG PO TABS: 25.0000 mg | ORAL_TABLET | Freq: Two times a day (BID) | ORAL | 3 refills | Status: DC

## 2019-05-14 MED ORDER — ASPIRIN 81 MG PO TBEC: 81.0000 mg | DELAYED_RELEASE_TABLET | Freq: Every day | ORAL | 3 refills | Status: AC

## 2019-05-14 MED ORDER — REGADENOSON 0.4 MG/5ML IV SOLN
INTRAVENOUS | Status: AC
  Administered 2019-05-14: 08:00:00 via INTRAVENOUS
  Filled 2019-05-14: qty 5

## 2019-05-14 MED ORDER — PANTOPRAZOLE SODIUM 40 MG PO TBEC: 40.0000 mg | DELAYED_RELEASE_TABLET | Freq: Every day | ORAL | 1 refills | Status: DC

## 2019-05-14 MED ORDER — TECHNETIUM TC 99M TETROFOSMIN IV KIT
30.0000 | PACK | Freq: Once | INTRAVENOUS | Status: AC | PRN
  Administered 2019-05-14: 29 via INTRAVENOUS

## 2019-05-14 MED ORDER — TECHNETIUM TC 99M TETROFOSMIN IV KIT
10.0000 | PACK | Freq: Once | INTRAVENOUS | Status: AC | PRN
  Administered 2019-05-14: 9.63 via INTRAVENOUS

## 2019-05-14 MED ORDER — ATORVASTATIN CALCIUM 40 MG PO TABS: 40.0000 mg | ORAL_TABLET | Freq: Every day | ORAL | 3 refills | Status: DC

## 2019-05-14 NOTE — Progress Notes (Signed)
IV removed, patient tolerated well.  Reviewed AVS with patient, who verbalized understanding.  Patient taken to ED parking lot via wheelchair and patient drove herself home.

## 2019-05-14 NOTE — Discharge Summary (Signed)
Physician Discharge Summary  Constance HawMary D [address] BMW:[id number] DOB: 08/10/1956 DOA: 05/13/2019  PCP: Kari BaarsHawkins, Edward, MD  Admit date: 05/13/2019 Discharge date: 05/14/2019  Time spent: 35 minutes  Recommendations for Outpatient Follow-up:  1. Repeat basic metabolic panel to follow electrolytes and renal function 2. Continue assisting patient with smoking cessation. 3. Reassess for complete resolution and no further episodes of chest pain.   Discharge Diagnoses:  Active Problems:   Chest pain in adult   Elevated troponin   Hyperlipidemia   Chronic obstructive pulmonary disease (HCC) Anxiety GERD Tobacco abuse Type 2 diabetes mellitus (well controlled and not requiring medication usage). Prior history of coronary artery disease (nonobstructive).  Discharge Condition: Stable and improved.  Patient discharged home with instruction to follow-up with PCP and cardiology service as an outpatient.  Diet recommendation: Heart healthy and modify carbohydrates diet.  Filed Weights   05/13/19 0702 05/13/19 1415  Weight: 81.6 kg 84.4 kg    History of present illness:  63 y.o. female with a past medical history significant for anxiety, COPD, prior history of coronary artery disease (nonobstructive), type 2 diabetes mellitus, gastroesophageal reflux disease, hyperlipidemia, tobacco abuse and hypothyroidism; who presented to the emergency department secondary to chest pain.  Patient reports on precordial chest pain the day prior to admission which she rated 3-4 out of 10 in intensity.  Patient reports taking some antiacid over-the-counter without any improvement.  Pain was localized in the middle of her chest radiated to the left side and left upper extremity.  She reports having the symptoms worse while engaging into activity and expressed having some associated nausea/diaphoresis.   She denies fever, chills, vomiting, dysuria, hematuria, abdominal pain, headaches, focal weakness, sick contacts or any  other complaints.  Patient presented to the emergency department for further evaluation and management; patient received 4 baby aspirin with complete resolution of her chest discomfort since then.  In the ED chest x-ray demonstrated chronic COPD changes but no acute cardiopulmonary process.  Troponin was elevated in the range of 156, 167.  EKG demonstrated sinus rhythm without acute ischemic abnormalities.  Given patient risk factors and a heart score of 5 cardiology was consulted and TRH called to place patient in observation for further evaluation and management.  Hospital Course:  1-chest pain with elevated troponin -Patient initially treated with heparin drip, aspirin, statins and beta-blocker -After receiving aspirin in the ED chest pain was completely resolved -No acute ischemic changes appreciated on EKG or telemetry -2D echo reassuring with no wall motion abnormalities and preserved ejection fraction -Lexiscan Myoview demonstrated to be low risk probability and after discussing with cardiology service decision was made to continue medical management with outpatient follow-up and not pursuing any further inpatient ischemic work-up at this time. -LDL 113, triglycerides 167 -A1c 5.3. -Patient has been discharged on aspirin 81 mg daily, Lipitor 40 mg daily and metoprolol 25 mg twice a day.  2-hyperlipidemia -LDL as mentioned above 113 -Patient discharged on Lipitor -Heart healthy diet has been encouraged.  3-hypothyroidism -Continue Synthroid -Continue outpatient follow-up with endocrinologist for further adjustment in her regimen.  TSH slightly below normal range.  4-gastroesophageal reflux disease -Continue PPI.  5-essential hypertension -Patient prior to admission was not taking any antihypertensive agent; has been now discharged on metoprolol twice a day with good response and control of her blood pressure -Advised to follow heart healthy diet.  6-history of COPD/tobacco  abuse -Smoking cessation counseling provided -Patient declined nicotine patch -Continue home inhaler/nebulizer regimen -No wheezing, normal respiratory effort  and good oxygen saturation on room air currently.  7-anxiety -Continue as needed Ativan.  8-type 2 diabetes mellitus -Hemoglobin A1C 5.3 -Continue modified carbohydrate diet -No need for medications currently.  Procedures:  See below for x-ray reports  2D echo: Demonstrating preserved ejection fraction and no wall motion abnormalities  Lexiscan Myoview: Low risk probability for ischemia.  Consultations:  Cardiology service  Discharge Exam: Vitals:   05/14/19 0523 05/14/19 1007  BP: 140/71 136/85  Pulse: 69 74  Resp: 16   Temp: 98.4 F (36.9 C)   SpO2: 95%     General: Afebrile, in no acute distress.  Denies chest pain or shortness of breath. Cardiovascular: S1 and S2, no murmurs, no rubs, no gallops.  No JVD appreciated.  Positive right carotid bruit. Respiratory: Good air movement bilaterally, no wheezing, no crackles.  Normal respiratory effort. Abdomen: Soft, nontender, nondistended, positive bowel sounds. Extremities: No edema, no cyanosis or clubbing.  Discharge Instructions   Discharge Instructions    Diet - low sodium heart healthy   Complete by: As directed    Discharge instructions   Complete by: As directed    Take medications as prescribed Maintain adequate hydration Follow heart healthy and modify carbohydrates diet Follow-up with PCP in 10 days Follow-up with cardiology service as instructed.     Allergies as of 05/14/2019      Reactions   Zegerid [omeprazole-sodium Bicarbonate]    Irregular Heart Beat      Medication List    TAKE these medications   aspirin 81 MG EC tablet Take 1 tablet (81 mg total) by mouth daily. Start taking on: May 15, 2019   atorvastatin 40 MG tablet Commonly known as: LIPITOR Take 1 tablet (40 mg total) by mouth daily at 6 PM.   B COMPLEX-C-FOLIC  ACID ER PO Take 1 capsule by mouth daily.   cyanocobalamin 1000 MCG/ML injection Commonly known as: (VITAMIN B-12) Inject 1 mL into the muscle every 30 (thirty) days.   LORazepam 1 MG tablet Commonly known as: ATIVAN Take 1 mg by mouth 4 (four) times daily.   metoprolol tartrate 25 MG tablet Commonly known as: LOPRESSOR Take 1 tablet (25 mg total) by mouth 2 (two) times daily.   pantoprazole 40 MG tablet Commonly known as: PROTONIX Take 1 tablet (40 mg total) by mouth daily. Start taking on: May 15, 2019   ProAir HFA 108 (90 Base) MCG/ACT inhaler Generic drug: albuterol Inhale 1 puff into the lungs 3 (three) times daily.   Symbicort 160-4.5 MCG/ACT inhaler Generic drug: budesonide-formoterol Inhale 1 puff into the lungs daily.   Thyroid 113.75 MG Tabs Commonly known as: WP Thyroid Take 1 tab daily in AM. **PT NEEDS LAB APPT** What changed:   how much to take  how to take this  when to take this      Allergies  Allergen Reactions  . Zegerid [Omeprazole-Sodium Bicarbonate]     Irregular Heart Beat   Follow-up Information    Ellsworth Lennox, PA-C Follow up on 06/05/2019.   Specialties: Physician Assistant, Cardiology Why: Cardiology Follow-Up on 06/05/2019 at 3:30 with Randall An, PA-C (works with Dr. [name]).  Contact information: [address] [place] [address] [phone]        Kari Baars, MD. Schedule an appointment as soon as possible for a visit in 10 day(s).   Specialty: Pulmonary Disease Contact information: [address] [place] [address] [phone]        Laqueta Linden, MD .  Specialty: Cardiology Contact information: [address] [place] [address] [phone]            The results of significant diagnostics from this hospitalization (including imaging, microbiology, ancillary and laboratory) are listed below for reference.    Significant Diagnostic Studies: Dg Chest 2 View  Result  Date: 05/13/2019 CLINICAL DATA:  Chest pain into left arm with some sob since last night/copd/diabetic/smoker/hx heart cath EXAM: CHEST - 2 VIEW COMPARISON:  02/18/2019. FINDINGS: Cardiac silhouette is normal in size. No mediastinal or hilar masses. No evidence of adenopathy. Lungs are hyperexpanded. Stable scarring in the upper lobes. No evidence of pneumonia or pulmonary edema. No pleural effusion or pneumothorax. Skeletal structures are intact. IMPRESSION: 1. COPD without acute cardiopulmonary disease. Electronically Signed   By: Amie Portlandavid  Ormond M.D.   On: 05/13/2019 08:06   [place] Carotid Bilateral  Result Date: 05/13/2019 CLINICAL DATA:  63 year old female with a right-sided carotid bruit EXAM: BILATERAL CAROTID DUPLEX ULTRASOUND TECHNIQUE: Wallace CullensGray scale imaging, color Doppler and duplex ultrasound were performed of bilateral carotid and vertebral arteries in the neck. COMPARISON:  None. FINDINGS: Criteria: Quantification of carotid stenosis is based on velocity parameters that correlate the residual internal carotid diameter with NASCET-based stenosis levels, using the diameter of the distal internal carotid lumen as the denominator for stenosis measurement. The following velocity measurements were obtained: RIGHT ICA: 447/109 cm/sec CCA: 63/11 cm/sec SYSTOLIC ICA/CCA RATIO:  7.0 ECA:  189 cm/sec LEFT ICA: 193/36 cm/sec CCA: 75/21 cm/sec SYSTOLIC ICA/CCA RATIO:  2.6 ECA:  446 cm/sec RIGHT CAROTID ARTERY: Hypoechoic atherosclerotic plaque in the proximal internal carotid artery results in significant elevation of the peak systolic velocity consistent with a high-grade stenosis. There is evidence of spectral broadening and high velocity jetting. Additionally, there is significant elevation of the peak systolic velocity in the proximal external carotid artery consistent with a greater than 60% stenosis. RIGHT VERTEBRAL ARTERY:  Patent with antegrade flow. LEFT CAROTID ARTERY: Hypoechoic atherosclerotic plaque in the  carotid bifurcation extending into the proximal internal and external carotid arteries. By peak systolic velocity criteria, the internal carotid stenosis falls in the 50-69% diameter range. Additionally, there is significant elevation of the peak systolic velocity in the proximal external carotid artery consistent with a greater than 70% diameter stenosis. LEFT VERTEBRAL ARTERY:  Patent with normal antegrade flow. IMPRESSION: 1. Severe (70-99%) stenosis proximal right internal carotid artery secondary to focal hypoechoic atherosclerotic plaque. 2. Moderate (50-69%) stenosis proximal left internal carotid artery secondary to hypoechoic atherosclerotic plaque. Please note that the degree of stenosis is likely near the lower end of the spectrum secondary to exaggerated compensatory peak systolic velocities in the setting of a high-grade contralateral stenosis. 3. Bilateral external carotid artery stenoses noted incidentally. 4. The vertebral arteries are patent with normal antegrade flow. Signed, Sterling BigHeath K. McCullough, MD, RPVI [facility] Electronically Signed   By: Malachy MoanHeath  McCullough M.D.   On: 05/13/2019 14:34   Nm Myocar Multi W/spect W/wall Motion / Ef  Result Date: 05/14/2019  This is a low risk study.  The left ventricular ejection fraction is hyperdynamic (>65%).  Small inferior wall defect from apex to base. No ischemia. Normal functional images with EF 72% No RWMA. Most likely related to motion artifact and diaphragmatic attenuation Low risk study    Microbiology: Recent Results (from the past 240 hour(s))  SARS Coronavirus 2 (CEPHEID - Performed in [facility] lab), Hosp Order     Status: None  Collection Time: 05/13/19 11:43 AM   Specimen: Nasopharyngeal Swab  Result Value Ref Range Status   SARS Coronavirus 2 NEGATIVE NEGATIVE Final    Comment: (NOTE) If result is NEGATIVE SARS-CoV-2 target nucleic acids are NOT  DETECTED. The SARS-CoV-2 RNA is generally detectable in upper and lower  respiratory specimens during the acute phase of infection. The lowest  concentration of SARS-CoV-2 viral copies this assay can detect is 250  copies / mL. A negative result does not preclude SARS-CoV-2 infection  and should not be used as the sole basis for treatment or other  patient management decisions.  A negative result may occur with  improper specimen collection / handling, submission of specimen other  than nasopharyngeal swab, presence of viral mutation(s) within the  areas targeted by this assay, and inadequate number of viral copies  (<250 copies / mL). A negative result must be combined with clinical  observations, patient history, and epidemiological information. If result is POSITIVE SARS-CoV-2 target nucleic acids are DETECTED. The SARS-CoV-2 RNA is generally detectable in upper and lower  respiratory specimens dur ing the acute phase of infection.  Positive  results are indicative of active infection with SARS-CoV-2.  Clinical  correlation with patient history and other diagnostic information is  necessary to determine patient infection status.  Positive results do  not rule out bacterial infection or co-infection with other viruses. If result is PRESUMPTIVE POSTIVE SARS-CoV-2 nucleic acids MAY BE PRESENT.   A presumptive positive result was obtained on the submitted specimen  and confirmed on repeat testing.  While 2019 novel coronavirus  (SARS-CoV-2) nucleic acids may be present in the submitted sample  additional confirmatory testing may be necessary for epidemiological  and / or clinical management purposes  to differentiate between  SARS-CoV-2 and other Sarbecovirus currently known to infect humans.  If clinically indicated additional testing with an alternate test  methodology [phone]) is advised. The SARS-CoV-2 RNA is generally  detectable in upper and lower respiratory sp ecimens during  the acute  phase of infection. The expected result is Negative. Fact Sheet for Patients:  StrictlyIdeas.no Fact Sheet for Healthcare Providers: BankingDealers.co.za This test is not yet approved or cleared by the [place] FDA and has been authorized for detection and/or diagnosis of SARS-CoV-2 by FDA under an Emergency Use Authorization (EUA).  This EUA will remain in effect (meaning this test can be used) for the duration of the COVID-19 declaration under Section 564(b)(1) of the Act, 21 U.S.C. section 360bbb-3(b)(1), unless the authorization is terminated or revoked sooner. Performed at [facility], [address] [place], [place] [address]      Labs: Basic Metabolic Panel: Recent Labs  Lab 05/13/19 0710  NA 140  K 3.9  CL 105  CO2 23  GLUCOSE 106*  BUN 20  CREATININE 0.54  CALCIUM 9.8   CBC: Recent Labs  Lab 05/13/19 0710 05/14/19 0129  WBC 5.9 6.8  NEUTROABS 2.5  --   HGB 13.5 13.1  HCT 40.3 40.3  MCV 92.6 95.7  PLT 252 248    Signed:  Barton Dubois MD.  [facility] 05/14/2019, 1:34 PM

## 2019-05-14 NOTE — Care Management Obs Status (Signed)
[facility] NOTIFICATION   Patient Details  Name: Tabitha Schmidt MRN: [id number] Date of Birth: 1955/12/28   Medicare Observation Status Notification Given:  Yes    Ihor Gully, LCSW 05/14/2019, 1:43 PM

## 2019-05-14 NOTE — Discharge Instructions (Signed)

## 2019-05-14 NOTE — Plan of Care (Signed)
  Problem: Education: Goal: Knowledge of General Education information will improve Description: Including pain rating scale, medication(s)/side effects and non-pharmacologic comfort measures 05/14/2019 1350 by Angelyn Punt, RN Outcome: Adequate for Discharge 05/14/2019 1350 by Angelyn Punt, RN Outcome: Progressing   Problem: Health Behavior/Discharge Planning: Goal: Ability to manage health-related needs will improve 05/14/2019 1350 by Angelyn Punt, RN Outcome: Adequate for Discharge 05/14/2019 1350 by Angelyn Punt, RN Outcome: Progressing   Problem: Clinical Measurements: Goal: Ability to maintain clinical measurements within normal limits will improve 05/14/2019 1350 by Angelyn Punt, RN Outcome: Adequate for Discharge 05/14/2019 1350 by Angelyn Punt, RN Outcome: Progressing Goal: Will remain free from infection 05/14/2019 1350 by Angelyn Punt, RN Outcome: Adequate for Discharge 05/14/2019 1350 by Angelyn Punt, RN Outcome: Progressing Goal: Diagnostic test results will improve 05/14/2019 1350 by Angelyn Punt, RN Outcome: Adequate for Discharge 05/14/2019 1350 by Angelyn Punt, RN Outcome: Progressing Goal: Respiratory complications will improve 05/14/2019 1350 by Angelyn Punt, RN Outcome: Adequate for Discharge 05/14/2019 1350 by Angelyn Punt, RN Outcome: Progressing Goal: Cardiovascular complication will be avoided 05/14/2019 1350 by Angelyn Punt, RN Outcome: Adequate for Discharge 05/14/2019 1350 by Angelyn Punt, RN Outcome: Progressing   Problem: Activity: Goal: Risk for activity intolerance will decrease 05/14/2019 1350 by Angelyn Punt, RN Outcome: Adequate for Discharge 05/14/2019 1350 by Angelyn Punt, RN Outcome: Progressing   Problem: Nutrition: Goal: Adequate nutrition will be maintained 05/14/2019 1350 by Angelyn Punt, RN Outcome: Adequate for  Discharge 05/14/2019 1350 by Angelyn Punt, RN Outcome: Progressing   Problem: Coping: Goal: Level of anxiety will decrease 05/14/2019 1350 by Angelyn Punt, RN Outcome: Adequate for Discharge 05/14/2019 1350 by Angelyn Punt, RN Outcome: Progressing   Problem: Elimination: Goal: Will not experience complications related to bowel motility 05/14/2019 1350 by Angelyn Punt, RN Outcome: Adequate for Discharge 05/14/2019 1350 by Angelyn Punt, RN Outcome: Progressing Goal: Will not experience complications related to urinary retention 05/14/2019 1350 by Angelyn Punt, RN Outcome: Adequate for Discharge 05/14/2019 1350 by Angelyn Punt, RN Outcome: Progressing   Problem: Pain Managment: Goal: General experience of comfort will improve 05/14/2019 1350 by Angelyn Punt, RN Outcome: Adequate for Discharge 05/14/2019 1350 by Angelyn Punt, RN Outcome: Progressing   Problem: Safety: Goal: Ability to remain free from injury will improve 05/14/2019 1350 by Angelyn Punt, RN Outcome: Adequate for Discharge 05/14/2019 1350 by Angelyn Punt, RN Outcome: Progressing

## 2019-05-14 NOTE — Progress Notes (Addendum)
Progress Note  Patient Name: Tabitha Schmidt Date of Encounter: 05/14/2019  Primary Cardiologist: Kate Sable, MD   Subjective   She denies any recurrent chest pain overnight or this morning. Evaluated in Cardiac Rehab at the time of her stress test. Has been NPO since midnight.   Inpatient Medications    Scheduled Meds: . aspirin EC  81 mg Oral Daily  . atorvastatin  40 mg Oral q1800  . levothyroxine  113 mcg Oral Q0600  . metoprolol tartrate  25 mg Oral BID  . mometasone-formoterol  2 puff Inhalation BID  . pantoprazole  40 mg Oral Daily   Continuous Infusions: . heparin 1,000 Units/hr (05/14/19 0343)   PRN Meds: acetaminophen, albuterol, alum & mag hydroxide-simeth, LORazepam, ondansetron (ZOFRAN) IV, technetium tetrofosmin, technetium tetrofosmin   Vital Signs    Vitals:   05/13/19 1300 05/13/19 1415 05/13/19 2112 05/14/19 0523  BP: 140/88 139/64 119/74 140/71  Pulse: 60 70 61 69  Resp: 14 16 16 16   Temp:   98.2 F (36.8 C) 98.4 F (36.9 C)  TempSrc:   Oral Oral  SpO2: 98% 98% 99% 95%  Weight:  84.4 kg    Height:  5\' 7"  (1.702 m)      Intake/Output Summary (Last 24 hours) at 05/14/2019 0818 Last data filed at 05/14/2019 0343 Gross per 24 hour  Intake 203.92 ml  Output -  Net 203.92 ml    Last 3 Weights 05/13/2019 05/13/2019 01/05/2019  Weight (lbs) 186 lb 180 lb 165 lb  Weight (kg) 84.369 kg 81.647 kg 74.844 kg      Telemetry    NSR, HR in 60's to 80's. No significant arrhythmias. - Personally Reviewed  ECG    NSR, HR 70, with no acute ST or T-wave changes.  - Personally Reviewed  Physical Exam   General: Well developed, well nourished, female appearing in no acute distress. Head: Normocephalic, atraumatic.  Neck: JVD not elevated. Right bruit present.  Lungs:  Resp regular and unlabored, CTA without wheezing or rales. Heart: RRR, S1, S2, no S3, S4, or murmur; no rub. Abdomen: Soft, non-tender, non-distended with normoactive bowel sounds. No  hepatomegaly. No rebound/guarding. No obvious abdominal masses. Extremities: No clubbing, cyanosis, or lower extremity edema. Distal pedal pulses are 2+ bilaterally. Neuro: Alert and oriented X 3. Moves all extremities spontaneously. Psych: Normal affect.  Labs    Chemistry Recent Labs  Lab 05/13/19 0710  NA 140  K 3.9  CL 105  CO2 23  GLUCOSE 106*  BUN 20  CREATININE 0.54  CALCIUM 9.8  GFRNONAA >60  GFRAA >60  ANIONGAP 12     Hematology Recent Labs  Lab 05/13/19 0710 05/14/19 0129  WBC 5.9 6.8  RBC 4.35 4.21  HGB 13.5 13.1  HCT 40.3 40.3  MCV 92.6 95.7  MCH 31.0 31.1  MCHC 33.5 32.5  RDW 12.3 12.2  PLT 252 248    Cardiac EnzymesNo results for input(s): TROPONINI in the last 168 hours. No results for input(s): TROPIPOC in the last 168 hours.   BNPNo results for input(s): BNP, PROBNP in the last 168 hours.   DDimer  Recent Labs  Lab 05/13/19 0710  DDIMER 0.51*     Radiology    Dg Chest 2 View  Result Date: 05/13/2019 CLINICAL DATA:  Chest pain into left arm with some sob since last night/copd/diabetic/smoker/hx heart cath EXAM: CHEST - 2 VIEW COMPARISON:  02/18/2019. FINDINGS: Cardiac silhouette is normal in size. No mediastinal or hilar  masses. No evidence of adenopathy. Lungs are hyperexpanded. Stable scarring in the upper lobes. No evidence of pneumonia or pulmonary edema. No pleural effusion or pneumothorax. Skeletal structures are intact. IMPRESSION: 1. COPD without acute cardiopulmonary disease. Electronically Signed   By: Amie Portland M.D.   On: 05/13/2019 08:06   US Carotid Bilateral  Result Date: 05/13/2019 CLINICAL DATA:  63 year old female with a right-sided carotid bruit EXAM: BILATERAL CAROTID DUPLEX ULTRASOUND TECHNIQUE: Wallace Cullens scale imaging, color Doppler and duplex ultrasound were performed of bilateral carotid and vertebral arteries in the neck. COMPARISON:  None. FINDINGS: Criteria: Quantification of carotid stenosis is based on velocity  parameters that correlate the residual internal carotid diameter with NASCET-based stenosis levels, using the diameter of the distal internal carotid lumen as the denominator for stenosis measurement. The following velocity measurements were obtained: RIGHT ICA: 447/109 cm/sec CCA: 63/11 cm/sec SYSTOLIC ICA/CCA RATIO:  7.0 ECA:  189 cm/sec LEFT ICA: 193/36 cm/sec CCA: 75/21 cm/sec SYSTOLIC ICA/CCA RATIO:  2.6 ECA:  446 cm/sec RIGHT CAROTID ARTERY: Hypoechoic atherosclerotic plaque in the proximal internal carotid artery results in significant elevation of the peak systolic velocity consistent with a high-grade stenosis. There is evidence of spectral broadening and high velocity jetting. Additionally, there is significant elevation of the peak systolic velocity in the proximal external carotid artery consistent with a greater than 60% stenosis. RIGHT VERTEBRAL ARTERY:  Patent with antegrade flow. LEFT CAROTID ARTERY: Hypoechoic atherosclerotic plaque in the carotid bifurcation extending into the proximal internal and external carotid arteries. By peak systolic velocity criteria, the internal carotid stenosis falls in the 50-69% diameter range. Additionally, there is significant elevation of the peak systolic velocity in the proximal external carotid artery consistent with a greater than 70% diameter stenosis. LEFT VERTEBRAL ARTERY:  Patent with normal antegrade flow. IMPRESSION: 1. Severe (70-99%) stenosis proximal right internal carotid artery secondary to focal hypoechoic atherosclerotic plaque. 2. Moderate (50-69%) stenosis proximal left internal carotid artery secondary to hypoechoic atherosclerotic plaque. Please note that the degree of stenosis is likely near the lower end of the spectrum secondary to exaggerated compensatory peak systolic velocities in the setting of a high-grade contralateral stenosis. 3. Bilateral external carotid artery stenoses noted incidentally. 4. The vertebral arteries are patent with  normal antegrade flow. Signed, Sterling Big, MD, RPVI [facility] Electronically Signed   By: Malachy Moan M.D.   On: 05/13/2019 14:34    Cardiac Studies   Echocardiogram: 05/13/2019 IMPRESSIONS    1. The left ventricle has normal systolic function with an ejection fraction of 60-65%. The cavity size was normal. There is mild concentric left ventricular hypertrophy. Left ventricular diastolic Doppler parameters are consistent with impaired  relaxation. No evidence of left ventricular regional wall motion abnormalities.  2. The right ventricle has normal systolic function. The cavity was normal. There is no increase in right ventricular wall thickness.  3. The mitral valve is grossly normal. Mild thickening of the mitral valve leaflet.  4. The tricuspid valve is grossly normal.  5. The aortic valve is tricuspid.  6. The aortic root is normal in size and structure.  Patient Profile     63 y.o. female w/ PMH of CAD (nonobstructive CAD by cath in 2008 with 60% D1 stenosis), HTN, COPD, and tobacco use who presented to [facility] ED on 05/13/2019 for evaluation of chest pain.   Assessment & Plan    1. Chest Pain with Mixed Typical and Atypical Features/ Elevated Troponin  -  presented for evaluation of chest pain which initially resolved with Rolaids then represented and radiated down her left arm. - EKG shows no acute ischemic changes. Echo shows a preserved EF of 60-65% with no regional WMA. HS Troponin values have been elevated at 167, 193, 187, and 197. - presented this morning for a Lexiscan Myoview. Tolerated well with no immediate complications. Further recommendations pending stress test results. If abnormal, will require a cardiac catheterization for definitive evaluation.  - continue ASA, BB, and statin therapy.   2. HTN - listed in history but not on anti-hypertensives prior to admission. BP has been variable at  119/64 - 145/92 since admission. Was started on Lopressor 25mg  BID this admission. Continue to follow BP.   3. Hypothyroidism - has been continued on PTA Synthroid.   4. COPD/Tobacco Use - continues to smoke 0.5 - 1.0 ppd. Cessation advised.   5. Carotid Artery Stenosis - carotid dopplers show severe (70-99%) stenosis along the proximal right internal carotid artery and moderate (50-69%) stenosis along the proximal left internal carotid artery. - she has been started on Lipitor 40mg  daily and continued on ASA. Will need referral to Vascular Surgery as an outpatient.   For questions or updates, please contact [facility] Please consult [email or web address] for contact info under Cardiology/STEMI.   Lorri FrederickSigned, Tabitha Schmidt , PA-C 8:18 AM 05/14/2019 Pager: [phone]  Patient examined chart reviewed In nuclear for stress test per Dr Darl HouseholderKoneswaren. TTE normal EF no RWMA no acute ECG changes but concerning with history of 60% D1 and positive HS troponin. Will review myovue latter today [facility]  Regions Financial CorporationPeter Nastacia Schmidt

## 2019-05-14 NOTE — Plan of Care (Signed)

## 2019-05-14 NOTE — Progress Notes (Signed)
ANTICOAGULATION CONSULT NOTE - follow up [place] for Heparin Indication: chest pain/ACS  Allergies  Allergen Reactions  . Zegerid [Omeprazole-Sodium Bicarbonate]     Irregular Heart Beat    Patient Measurements: Height: 5\' 7"  (170.2 cm) Weight: 186 lb (84.4 kg) IBW/kg (Calculated) : 61.6 HEPARIN DW (KG): 79.2  Vital Signs: Temp: 98.4 F (36.9 C) (07/14 0523) Temp Source: Oral (07/14 0523) BP: 136/85 (07/14 1007) Pulse Rate: 74 (07/14 1007)  Labs: Recent Labs    05/13/19 0710  05/13/19 1143 05/13/19 1337 05/13/19 1548 05/14/19 0129 05/14/19 0853  HGB 13.5  --   --   --   --  13.1  --   HCT 40.3  --   --   --   --  40.3  --   PLT 252  --   --   --   --  248  --   HEPARINUNFRC  --   --   --   --   --  0.33 0.24*  CREATININE 0.54  --   --   --   --   --   --   TROPONINIHS 156.00*   < > 193.00* 187.00* 197.00*  --   --    < > = values in this interval not displayed.    Estimated Creatinine Clearance: 78.2 mL/min (by C-G formula based on SCr of 0.54 mg/dL).   Medical History: Past Medical History:  Diagnosis Date  . Anxiety   . Cataracts, bilateral    Gershon Crane  . COPD (chronic obstructive pulmonary disease) ([place])   . Coronary artery disease    cardiac cath 2008  . DM (diabetes mellitus) ([place])    "came off medicines with dr's permission"  . GERD (gastroesophageal reflux disease)   . Hyperlipidemia    stopped meds on her own  . Hypothyroid   . Shortness of breath     Medications:  Medications Prior to Admission  Medication Sig Dispense Refill Last Dose  . cyanocobalamin (,VITAMIN B-12,) 1000 MCG/ML injection Inject 1 mL into the muscle every 30 (thirty) days.   Past Week at Unknown time  . LORazepam (ATIVAN) 1 MG tablet Take 1 mg by mouth 4 (four) times daily.   05/12/2019 at Unknown time  . PROAIR HFA 108 (90 Base) MCG/ACT inhaler Inhale 1 puff into the lungs 3 (three) times daily.   05/12/2019 at Unknown time  . SYMBICORT 160-4.5 MCG/ACT  inhaler Inhale 1 puff into the lungs daily.   05/12/2019 at Unknown time  . Thyroid (WP THYROID) 113.75 MG TABS Take 1 tab daily in AM. **PT NEEDS LAB APPT** (Patient taking differently: Take 97.5 mg by mouth daily. Take 1 tab daily in AM. **PT NEEDS LAB APPT**) 45 tablet 1 05/13/2019 at Unknown time  . B COMPLEX-C-FOLIC ACID ER PO Take 1 capsule by mouth daily.   Not Taking at Unknown time    Assessment: Patient complaining of chest pain.  Troponin was elevated in the range of 156, 167. Pharmacy asked to start heparin. Initial heparin level 0.33, and slight increase in heparin infusion this AM. HL now is 0.24 but heparin turned off for stress test at 0630 then restarted later which accounts for low level.  Will not adjust further since heparin was held. Recheck level in 6 hours.. .  Goal of Therapy:  Heparin level 0.3-0.7 units/ml Monitor platelets by anticoagulation protocol: Yes   Plan:  Continue  heparin infusion at 1000 units/hr Check anti-Xa level in ~6-8  hours and daily while on heparin Continue to monitor H&H and platelets  Elder Cyphers, BS Loura Back, BCPS Clinical Pharmacist Pager [phone] 05/14/2019,10:13 AM

## 2019-05-14 NOTE — Progress Notes (Signed)
ANTICOAGULATION CONSULT NOTE - Follow Up Consult  Pharmacy Consult for heparin Indication: chest pain/ACS  Labs: Recent Labs    05/13/19 0710  05/13/19 1143 05/13/19 1337 05/13/19 1548 05/14/19 0129  HGB 13.5  --   --   --   --  13.1  HCT 40.3  --   --   --   --  40.3  PLT 252  --   --   --   --  248  HEPARINUNFRC  --   --   --   --   --  0.33  CREATININE 0.54  --   --   --   --   --   TROPONINIHS 156.00*   < > 193.00* 187.00* 197.00*  --    < > = values in this interval not displayed.    Assessment: 63yo female therapeutic on heparin though at very low end of goal; no gtt issues or signs of bleeding per RN.  Goal of Therapy:  Heparin level 0.3-0.7 units/ml   Plan:  Will increase heparin gtt by slightly to 1000 units/hr and check level in 6 hours.    Wynona Neat, PharmD, BCPS  05/14/2019,2:53 AM

## 2019-05-17 ENCOUNTER — Emergency Department (HOSPITAL_COMMUNITY): Payer: Medicare Other

## 2019-05-17 ENCOUNTER — Encounter (HOSPITAL_COMMUNITY): Payer: Self-pay | Admitting: *Deleted

## 2019-05-17 ENCOUNTER — Inpatient Hospital Stay (HOSPITAL_COMMUNITY)
Admission: EM | Admit: 2019-05-17 | Discharge: 2019-05-19 | DRG: 247 | Disposition: A | Discharge status: Home / Self Care | Payer: Medicare Other | Attending: Family Medicine | Admitting: Family Medicine

## 2019-05-17 ENCOUNTER — Other Ambulatory Visit: Payer: Self-pay

## 2019-05-17 DIAGNOSIS — K219 Gastro-esophageal reflux disease without esophagitis: Secondary | ICD-10-CM | POA: Diagnosis present

## 2019-05-17 DIAGNOSIS — R778 Other specified abnormalities of plasma proteins: Secondary | ICD-10-CM

## 2019-05-17 DIAGNOSIS — Z8249 Family history of ischemic heart disease and other diseases of the circulatory system: Secondary | ICD-10-CM

## 2019-05-17 DIAGNOSIS — E119 Type 2 diabetes mellitus without complications: Secondary | ICD-10-CM | POA: Diagnosis present

## 2019-05-17 DIAGNOSIS — I10 Essential (primary) hypertension: Secondary | ICD-10-CM | POA: Diagnosis present

## 2019-05-17 DIAGNOSIS — I214 Non-ST elevation (NSTEMI) myocardial infarction: Principal | ICD-10-CM

## 2019-05-17 DIAGNOSIS — Z79899 Other long term (current) drug therapy: Secondary | ICD-10-CM

## 2019-05-17 DIAGNOSIS — R0789 Other chest pain: Secondary | ICD-10-CM | POA: Diagnosis not present

## 2019-05-17 DIAGNOSIS — E039 Hypothyroidism, unspecified: Secondary | ICD-10-CM | POA: Diagnosis present

## 2019-05-17 DIAGNOSIS — I25110 Atherosclerotic heart disease of native coronary artery with unstable angina pectoris: Secondary | ICD-10-CM | POA: Diagnosis present

## 2019-05-17 DIAGNOSIS — F17210 Nicotine dependence, cigarettes, uncomplicated: Secondary | ICD-10-CM | POA: Diagnosis present

## 2019-05-17 DIAGNOSIS — Z7951 Long term (current) use of inhaled steroids: Secondary | ICD-10-CM

## 2019-05-17 DIAGNOSIS — Z79890 Hormone replacement therapy (postmenopausal): Secondary | ICD-10-CM

## 2019-05-17 DIAGNOSIS — Z888 Allergy status to other drugs, medicaments and biological substances status: Secondary | ICD-10-CM

## 2019-05-17 DIAGNOSIS — J449 Chronic obstructive pulmonary disease, unspecified: Secondary | ICD-10-CM | POA: Diagnosis present

## 2019-05-17 DIAGNOSIS — F419 Anxiety disorder, unspecified: Secondary | ICD-10-CM | POA: Diagnosis present

## 2019-05-17 DIAGNOSIS — R079 Chest pain, unspecified: Secondary | ICD-10-CM | POA: Diagnosis present

## 2019-05-17 DIAGNOSIS — I219 Acute myocardial infarction, unspecified: Secondary | ICD-10-CM | POA: Diagnosis present

## 2019-05-17 DIAGNOSIS — E785 Hyperlipidemia, unspecified: Secondary | ICD-10-CM | POA: Diagnosis present

## 2019-05-17 DIAGNOSIS — Z1159 Encounter for screening for other viral diseases: Secondary | ICD-10-CM

## 2019-05-17 DIAGNOSIS — Z7982 Long term (current) use of aspirin: Secondary | ICD-10-CM

## 2019-05-17 DIAGNOSIS — R7989 Other specified abnormal findings of blood chemistry: Secondary | ICD-10-CM

## 2019-05-17 DIAGNOSIS — Z90710 Acquired absence of both cervix and uterus: Secondary | ICD-10-CM

## 2019-05-17 LAB — CBC WITH DIFFERENTIAL/PLATELET
Abs Immature Granulocytes: 0.03 10*3/uL (ref 0.00–0.07)
Basophils Absolute: 0 10*3/uL (ref 0.0–0.1)
Basophils Relative: 1 %
Eosinophils Absolute: 0.1 10*3/uL (ref 0.0–0.5)
Eosinophils Relative: 1 %
HCT: 41.4 % (ref 36.0–46.0)
Hemoglobin: 13.7 g/dL (ref 12.0–15.0)
Immature Granulocytes: 0 %
Lymphocytes Relative: 24 %
Lymphs Abs: 2 10*3/uL (ref 0.7–4.0)
MCH: 30.5 pg (ref 26.0–34.0)
MCHC: 33.1 g/dL (ref 30.0–36.0)
MCV: 92.2 fL (ref 80.0–100.0)
Monocytes Absolute: 0.5 10*3/uL (ref 0.1–1.0)
Monocytes Relative: 6 %
Neutro Abs: 5.8 10*3/uL (ref 1.7–7.7)
Neutrophils Relative %: 68 %
Platelets: 265 10*3/uL (ref 150–400)
RBC: 4.49 MIL/uL (ref 3.87–5.11)
RDW: 12.4 % (ref 11.5–15.5)
WBC: 8.5 10*3/uL (ref 4.0–10.5)
nRBC: 0 % (ref 0.0–0.2)

## 2019-05-17 MED ORDER — SUCRALFATE 1 GM/10ML PO SUSP
1.0000 g | Freq: Three times a day (TID) | ORAL | Status: DC
  Administered 2019-05-18 (×3): 1 g via ORAL
  Filled 2019-05-17 (×5): qty 10

## 2019-05-17 MED ORDER — MORPHINE SULFATE (PF) 4 MG/ML IV SOLN
4.0000 mg | Freq: Once | INTRAVENOUS | Status: AC
  Administered 2019-05-18: 4 mg via INTRAVENOUS
  Filled 2019-05-17: qty 1

## 2019-05-17 MED ORDER — ONDANSETRON HCL 4 MG/2ML IJ SOLN
4.0000 mg | Freq: Once | INTRAMUSCULAR | Status: AC
  Administered 2019-05-18: 4 mg via INTRAVENOUS
  Filled 2019-05-17: qty 2

## 2019-05-17 NOTE — ED Triage Notes (Signed)
Pt c/o chest pain that started x 2 hours ago; pt states she took a nitro and it helped ease the pain for about 20 mins and then the pain came back; pt was just discharged x 2 days ago for same complaint

## 2019-05-17 NOTE — ED Notes (Signed)
EKG done and seen by Dr Horton 

## 2019-05-17 NOTE — ED Notes (Signed)
Patient on cardiac monitor at this time. 

## 2019-05-18 ENCOUNTER — Encounter (HOSPITAL_COMMUNITY): Payer: Self-pay | Admitting: Internal Medicine

## 2019-05-18 ENCOUNTER — Other Ambulatory Visit: Payer: Self-pay

## 2019-05-18 ENCOUNTER — Encounter (HOSPITAL_COMMUNITY)
Admission: EM | Disposition: A | Discharge status: Home / Self Care | Payer: Self-pay | Source: Home / Self Care | Attending: Family Medicine

## 2019-05-18 DIAGNOSIS — R079 Chest pain, unspecified: Secondary | ICD-10-CM | POA: Diagnosis present

## 2019-05-18 DIAGNOSIS — F17210 Nicotine dependence, cigarettes, uncomplicated: Secondary | ICD-10-CM | POA: Diagnosis present

## 2019-05-18 DIAGNOSIS — I214 Non-ST elevation (NSTEMI) myocardial infarction: Secondary | ICD-10-CM

## 2019-05-18 DIAGNOSIS — Z7982 Long term (current) use of aspirin: Secondary | ICD-10-CM | POA: Diagnosis not present

## 2019-05-18 DIAGNOSIS — I2510 Atherosclerotic heart disease of native coronary artery without angina pectoris: Secondary | ICD-10-CM | POA: Diagnosis not present

## 2019-05-18 DIAGNOSIS — E039 Hypothyroidism, unspecified: Secondary | ICD-10-CM | POA: Diagnosis present

## 2019-05-18 DIAGNOSIS — Z79890 Hormone replacement therapy (postmenopausal): Secondary | ICD-10-CM | POA: Diagnosis not present

## 2019-05-18 DIAGNOSIS — E785 Hyperlipidemia, unspecified: Secondary | ICD-10-CM | POA: Diagnosis present

## 2019-05-18 DIAGNOSIS — Z1159 Encounter for screening for other viral diseases: Secondary | ICD-10-CM | POA: Diagnosis not present

## 2019-05-18 DIAGNOSIS — I25110 Atherosclerotic heart disease of native coronary artery with unstable angina pectoris: Secondary | ICD-10-CM | POA: Diagnosis present

## 2019-05-18 DIAGNOSIS — J449 Chronic obstructive pulmonary disease, unspecified: Secondary | ICD-10-CM

## 2019-05-18 DIAGNOSIS — I10 Essential (primary) hypertension: Secondary | ICD-10-CM | POA: Diagnosis present

## 2019-05-18 DIAGNOSIS — I200 Unstable angina: Secondary | ICD-10-CM | POA: Diagnosis not present

## 2019-05-18 DIAGNOSIS — R0789 Other chest pain: Secondary | ICD-10-CM | POA: Diagnosis present

## 2019-05-18 DIAGNOSIS — Z7951 Long term (current) use of inhaled steroids: Secondary | ICD-10-CM | POA: Diagnosis not present

## 2019-05-18 DIAGNOSIS — E119 Type 2 diabetes mellitus without complications: Secondary | ICD-10-CM | POA: Diagnosis present

## 2019-05-18 DIAGNOSIS — I219 Acute myocardial infarction, unspecified: Secondary | ICD-10-CM | POA: Diagnosis present

## 2019-05-18 DIAGNOSIS — K219 Gastro-esophageal reflux disease without esophagitis: Secondary | ICD-10-CM | POA: Diagnosis present

## 2019-05-18 DIAGNOSIS — Z90710 Acquired absence of both cervix and uterus: Secondary | ICD-10-CM | POA: Diagnosis not present

## 2019-05-18 DIAGNOSIS — R7989 Other specified abnormal findings of blood chemistry: Secondary | ICD-10-CM

## 2019-05-18 DIAGNOSIS — Z8249 Family history of ischemic heart disease and other diseases of the circulatory system: Secondary | ICD-10-CM | POA: Diagnosis not present

## 2019-05-18 DIAGNOSIS — F419 Anxiety disorder, unspecified: Secondary | ICD-10-CM | POA: Diagnosis present

## 2019-05-18 DIAGNOSIS — Z888 Allergy status to other drugs, medicaments and biological substances status: Secondary | ICD-10-CM | POA: Diagnosis not present

## 2019-05-18 DIAGNOSIS — Z79899 Other long term (current) drug therapy: Secondary | ICD-10-CM | POA: Diagnosis not present

## 2019-05-18 HISTORY — PX: CORONARY STENT INTERVENTION: CATH118234

## 2019-05-18 HISTORY — PX: LEFT HEART CATH AND CORONARY ANGIOGRAPHY: CATH118249

## 2019-05-18 LAB — BASIC METABOLIC PANEL
Anion gap: 10 (ref 5–15)
BUN: 22 mg/dL (ref 8–23)
CO2: 27 mmol/L (ref 22–32)
Calcium: 9.3 mg/dL (ref 8.9–10.3)
Chloride: 102 mmol/L (ref 98–111)
Creatinine, Ser: 0.85 mg/dL (ref 0.44–1.00)
GFR calc Af Amer: >60 mL/min (ref >60)
GFR calc non Af Amer: >60 mL/min (ref >60)
Glucose, Bld: 136 mg/dL — ABNORMAL HIGH (ref 70–99)
Potassium: 4.2 mmol/L (ref 3.5–5.1)
Sodium: 139 mmol/L (ref 135–145)

## 2019-05-18 LAB — SARS CORONAVIRUS 2 BY RT PCR (HOSPITAL ORDER, PERFORMED IN ~~LOC~~ HOSPITAL LAB): SARS Coronavirus 2: NEGATIVE

## 2019-05-18 LAB — PROTIME-INR
INR: 1 (ref 0.8–1.2)
Prothrombin Time: 13.2 seconds (ref 11.4–15.2)

## 2019-05-18 LAB — GLUCOSE, CAPILLARY: Glucose-Capillary: 102 mg/dL — ABNORMAL HIGH (ref 70–99)

## 2019-05-18 LAB — TROPONIN I (HIGH SENSITIVITY)
Troponin I (High Sensitivity): 226 ng/L (ref <18)
Troponin I (High Sensitivity): 435 ng/L (ref <18)

## 2019-05-18 LAB — HEPATIC FUNCTION PANEL
ALT: 38 U/L (ref 0–44)
AST: 30 U/L (ref 15–41)
Albumin: 4.3 g/dL (ref 3.5–5.0)
Alkaline Phosphatase: 64 U/L (ref 38–126)
Bilirubin, Direct: 0.1 mg/dL (ref 0.0–0.2)
Indirect Bilirubin: 0.6 mg/dL (ref 0.3–0.9)
Total Bilirubin: 0.7 mg/dL (ref 0.3–1.2)
Total Protein: 7.3 g/dL (ref 6.5–8.1)

## 2019-05-18 LAB — LIPASE, BLOOD: Lipase: 42 U/L (ref 11–51)

## 2019-05-18 LAB — APTT: aPTT: 31 seconds (ref 24–36)

## 2019-05-18 LAB — HEPARIN LEVEL (UNFRACTIONATED): Heparin Unfractionated: 0.95 IU/mL — ABNORMAL HIGH (ref 0.30–0.70)

## 2019-05-18 SURGERY — LEFT HEART CATH AND CORONARY ANGIOGRAPHY
Anesthesia: LOCAL

## 2019-05-18 MED ORDER — HEPARIN (PORCINE) IN NACL 1000-0.9 UT/500ML-% IV SOLN
INTRAVENOUS | Status: DC | PRN
  Administered 2019-05-18 (×2): 500 mL

## 2019-05-18 MED ORDER — SODIUM CHLORIDE 0.9 % IV SOLN: 250.0000 mL | INTRAVENOUS | Status: DC | PRN

## 2019-05-18 MED ORDER — SODIUM CHLORIDE 0.9% FLUSH: 3.0000 mL | INTRAVENOUS | Status: DC | PRN

## 2019-05-18 MED ORDER — SODIUM CHLORIDE 0.9 % IV SOLN
INTRAVENOUS | Status: AC
  Administered 2019-05-18: 11:00:00 via INTRAVENOUS

## 2019-05-18 MED ORDER — SODIUM CHLORIDE 0.9% FLUSH: 3.0000 mL | Freq: Two times a day (BID) | INTRAVENOUS | Status: DC

## 2019-05-18 MED ORDER — ASPIRIN 81 MG PO CHEW: 81.0000 mg | CHEWABLE_TABLET | Freq: Every day | ORAL | Status: DC

## 2019-05-18 MED ORDER — HYDRALAZINE HCL 20 MG/ML IJ SOLN: 10.0000 mg | INTRAMUSCULAR | Status: AC | PRN

## 2019-05-18 MED ORDER — ACETAMINOPHEN 325 MG PO TABS: 650.0000 mg | ORAL_TABLET | Freq: Four times a day (QID) | ORAL | Status: DC | PRN

## 2019-05-18 MED ORDER — PANTOPRAZOLE SODIUM 40 MG PO TBEC
40.0000 mg | DELAYED_RELEASE_TABLET | Freq: Every day | ORAL | Status: DC
  Administered 2019-05-19: 40 mg via ORAL
  Filled 2019-05-18 (×2): qty 1

## 2019-05-18 MED ORDER — MORPHINE SULFATE (PF) 2 MG/ML IV SOLN: 2.0000 mg | INTRAVENOUS | Status: DC | PRN

## 2019-05-18 MED ORDER — MORPHINE SULFATE (PF) 4 MG/ML IV SOLN: 2.0000 mg | Freq: Once | INTRAVENOUS | Status: DC

## 2019-05-18 MED ORDER — LIDOCAINE HCL (PF) 1 % IJ SOLN
INTRAMUSCULAR | Status: DC | PRN
  Administered 2019-05-18: 2 mL

## 2019-05-18 MED ORDER — ATORVASTATIN CALCIUM 80 MG PO TABS
80.0000 mg | ORAL_TABLET | Freq: Every day | ORAL | Status: DC
  Administered 2019-05-18: 80 mg via ORAL
  Filled 2019-05-18: qty 1

## 2019-05-18 MED ORDER — METOPROLOL TARTRATE 25 MG PO TABS
25.0000 mg | ORAL_TABLET | Freq: Two times a day (BID) | ORAL | Status: DC
  Administered 2019-05-18: 25 mg via ORAL
  Filled 2019-05-18 (×2): qty 1

## 2019-05-18 MED ORDER — LABETALOL HCL 5 MG/ML IV SOLN: 10.0000 mg | INTRAVENOUS | Status: AC | PRN

## 2019-05-18 MED ORDER — ASPIRIN EC 325 MG PO TBEC
325.0000 mg | DELAYED_RELEASE_TABLET | Freq: Every day | ORAL | Status: DC
  Administered 2019-05-18: 325 mg via ORAL
  Filled 2019-05-18: qty 1

## 2019-05-18 MED ORDER — THYROID 30 MG PO TABS
90.0000 mg | ORAL_TABLET | Freq: Every day | ORAL | Status: DC
  Administered 2019-05-18 – 2019-05-19 (×2): 90 mg via ORAL
  Filled 2019-05-18 (×2): qty 3

## 2019-05-18 MED ORDER — PANTOPRAZOLE SODIUM 40 MG IV SOLR
40.0000 mg | Freq: Once | INTRAVENOUS | Status: AC
  Administered 2019-05-18: 40 mg via INTRAVENOUS
  Filled 2019-05-18: qty 40

## 2019-05-18 MED ORDER — ONDANSETRON HCL 4 MG/2ML IJ SOLN
4.0000 mg | Freq: Once | INTRAMUSCULAR | Status: AC
  Administered 2019-05-18: 4 mg via INTRAVENOUS
  Filled 2019-05-18: qty 2

## 2019-05-18 MED ORDER — ACETAMINOPHEN 650 MG RE SUPP: 650.0000 mg | Freq: Four times a day (QID) | RECTAL | Status: DC | PRN

## 2019-05-18 MED ORDER — HEPARIN (PORCINE) IN NACL 1000-0.9 UT/500ML-% IV SOLN
INTRAVENOUS | Status: AC
  Filled 2019-05-18: qty 1000

## 2019-05-18 MED ORDER — VERAPAMIL HCL 2.5 MG/ML IV SOLN
INTRAVENOUS | Status: AC
  Filled 2019-05-18: qty 2

## 2019-05-18 MED ORDER — HEPARIN SODIUM (PORCINE) 1000 UNIT/ML IJ SOLN
INTRAMUSCULAR | Status: DC | PRN
  Administered 2019-05-18: 6000 [IU] via INTRAVENOUS
  Administered 2019-05-18: 4000 [IU] via INTRAVENOUS

## 2019-05-18 MED ORDER — TICAGRELOR 90 MG PO TABS: 90.0000 mg | ORAL_TABLET | Freq: Two times a day (BID) | ORAL | Status: DC

## 2019-05-18 MED ORDER — MORPHINE SULFATE (PF) 10 MG/ML IV SOLN: 2.0000 mg | INTRAVENOUS | Status: DC | PRN

## 2019-05-18 MED ORDER — ENOXAPARIN SODIUM 40 MG/0.4ML ~~LOC~~ SOLN: 40.0000 mg | SUBCUTANEOUS | Status: DC

## 2019-05-18 MED ORDER — CYANOCOBALAMIN 1000 MCG/ML IJ SOLN: 1000.0000 ug | INTRAMUSCULAR | Status: DC

## 2019-05-18 MED ORDER — SODIUM CHLORIDE 0.9 % IV SOLN
INTRAVENOUS | Status: AC
  Administered 2019-05-18: 03:00:00 via INTRAVENOUS

## 2019-05-18 MED ORDER — SODIUM CHLORIDE 0.9 % WEIGHT BASED INFUSION
3.0000 mL/kg/h | INTRAVENOUS | Status: DC
  Administered 2019-05-18: 3 mL/kg/h via INTRAVENOUS

## 2019-05-18 MED ORDER — NITROGLYCERIN 2 % TD OINT
1.0000 [in_us] | TOPICAL_OINTMENT | Freq: Once | TRANSDERMAL | Status: AC
  Administered 2019-05-18: 1 [in_us] via TOPICAL
  Filled 2019-05-18: qty 1

## 2019-05-18 MED ORDER — NITROGLYCERIN IN D5W 200-5 MCG/ML-% IV SOLN
0.0000 ug/min | INTRAVENOUS | Status: DC
  Administered 2019-05-18: 5 ug/min via INTRAVENOUS
  Filled 2019-05-18 (×2): qty 250

## 2019-05-18 MED ORDER — ONDANSETRON HCL 4 MG/2ML IJ SOLN: 4.0000 mg | Freq: Four times a day (QID) | INTRAMUSCULAR | Status: DC | PRN

## 2019-05-18 MED ORDER — HEPARIN SODIUM (PORCINE) 1000 UNIT/ML IJ SOLN
INTRAMUSCULAR | Status: AC
  Filled 2019-05-18: qty 1

## 2019-05-18 MED ORDER — SODIUM CHLORIDE 0.9 % IV SOLN
INTRAVENOUS | Status: DC | PRN
  Administered 2019-05-18: 1000 mL via INTRAVENOUS

## 2019-05-18 MED ORDER — ASPIRIN 81 MG PO CHEW
81.0000 mg | CHEWABLE_TABLET | Freq: Every day | ORAL | Status: DC
  Administered 2019-05-19: 81 mg via ORAL
  Filled 2019-05-18: qty 1

## 2019-05-18 MED ORDER — HEPARIN BOLUS VIA INFUSION
4000.0000 [IU] | Freq: Once | INTRAVENOUS | Status: AC
  Administered 2019-05-18: 4000 [IU] via INTRAVENOUS

## 2019-05-18 MED ORDER — ASPIRIN 325 MG PO TABS
325.0000 mg | ORAL_TABLET | Freq: Once | ORAL | Status: AC
  Administered 2019-05-18: 325 mg via ORAL
  Filled 2019-05-18: qty 1

## 2019-05-18 MED ORDER — LIDOCAINE HCL (PF) 1 % IJ SOLN
INTRAMUSCULAR | Status: AC
  Filled 2019-05-18: qty 30

## 2019-05-18 MED ORDER — SODIUM CHLORIDE 0.9 % WEIGHT BASED INFUSION
1.0000 mL/kg/h | INTRAVENOUS | Status: DC
  Administered 2019-05-18: 250 mL via INTRAVENOUS

## 2019-05-18 MED ORDER — TICAGRELOR 90 MG PO TABS
90.0000 mg | ORAL_TABLET | Freq: Two times a day (BID) | ORAL | Status: DC
  Administered 2019-05-18 – 2019-05-19 (×2): 90 mg via ORAL
  Filled 2019-05-18 (×3): qty 1

## 2019-05-18 MED ORDER — TICAGRELOR 90 MG PO TABS
180.0000 mg | ORAL_TABLET | Freq: Once | ORAL | Status: AC
  Administered 2019-05-18: 180 mg via ORAL
  Filled 2019-05-18: qty 2

## 2019-05-18 MED ORDER — SODIUM CHLORIDE 0.9% FLUSH
3.0000 mL | Freq: Two times a day (BID) | INTRAVENOUS | Status: DC
  Administered 2019-05-18: 3 mL via INTRAVENOUS

## 2019-05-18 MED ORDER — VERAPAMIL HCL 2.5 MG/ML IV SOLN
INTRA_ARTERIAL | Status: DC | PRN
  Administered 2019-05-18: 5 mL via INTRA_ARTERIAL

## 2019-05-18 MED ORDER — IOHEXOL 350 MG/ML SOLN
INTRAVENOUS | Status: DC | PRN
  Administered 2019-05-18: 135 mL via INTRA_ARTERIAL

## 2019-05-18 MED ORDER — LORAZEPAM 1 MG PO TABS: 1.0000 mg | ORAL_TABLET | Freq: Four times a day (QID) | ORAL | Status: DC | PRN

## 2019-05-18 MED ORDER — HEPARIN (PORCINE) 25000 UT/250ML-% IV SOLN
1000.0000 [IU]/h | INTRAVENOUS | Status: DC
  Administered 2019-05-18: 1000 [IU]/h via INTRAVENOUS
  Filled 2019-05-18: qty 250

## 2019-05-18 MED ORDER — NITROGLYCERIN 1 MG/10 ML FOR IR/CATH LAB
INTRA_ARTERIAL | Status: AC
  Filled 2019-05-18: qty 10

## 2019-05-18 MED ORDER — ACETAMINOPHEN 325 MG PO TABS: 650.0000 mg | ORAL_TABLET | ORAL | Status: DC | PRN

## 2019-05-18 MED ORDER — ALBUTEROL SULFATE (2.5 MG/3ML) 0.083% IN NEBU: 2.5000 mg | INHALATION_SOLUTION | Freq: Four times a day (QID) | RESPIRATORY_TRACT | Status: DC | PRN

## 2019-05-18 SURGICAL SUPPLY — 19 items
BALLN SAPPHIRE 2.0X12 (BALLOONS) ×2
BALLN SAPPHIRE ~~LOC~~ 3.25X12 (BALLOONS) ×2 IMPLANT
BALLOON SAPPHIRE 2.0X12 (BALLOONS) ×1 IMPLANT
CATH 5FR JL3.5 JR4 ANG PIG MP (CATHETERS) ×2 IMPLANT
CATH OPTITORQUE TIG 4.5 5F (CATHETERS) ×2 IMPLANT
CATH VISTA GUIDE 6FR JR4 (CATHETERS) ×2 IMPLANT
DEVICE RAD COMP TR BAND LRG (VASCULAR PRODUCTS) ×2 IMPLANT
GLIDESHEATH SLEND A-KIT 6F 22G (SHEATH) ×2 IMPLANT
GUIDEWIRE INQWIRE 1.5J.035X260 (WIRE) ×1 IMPLANT
INQWIRE 1.5J .035X260CM (WIRE) ×2
KIT ENCORE 26 ADVANTAGE (KITS) ×2 IMPLANT
KIT HEART LEFT (KITS) ×2 IMPLANT
PACK CARDIAC CATHETERIZATION (CUSTOM PROCEDURE TRAY) ×2 IMPLANT
STENT SYNERGY DES 3X16 (Permanent Stent) ×2 IMPLANT
SYR MEDRAD MARK 7 150ML (SYRINGE) ×2 IMPLANT
TRANSDUCER W/STOPCOCK (MISCELLANEOUS) ×2 IMPLANT
TUBING CIL FLEX 10 FLL-RA (TUBING) ×2 IMPLANT
WIRE ASAHI PROWATER 180CM (WIRE) ×2 IMPLANT
WIRE HI TORQ VERSACORE-J 145CM (WIRE) ×2 IMPLANT

## 2019-05-18 NOTE — Interval H&P Note (Signed)
History and Physical Interval Note:  05/18/2019 9:04 AM  Tabitha Schmidt  has presented today for surgery, with the diagnosis of STEMI.  The various methods of treatment have been discussed with the patient and family. After consideration of risks, benefits and other options for treatment, the patient has consented to  Procedure(s): Coronary/Graft Acute MI Revascularization (N/A) LEFT HEART CATH AND CORONARY ANGIOGRAPHY (N/A) as a surgical intervention.  The patient's history has been reviewed, patient examined, no change in status, stable for surgery.  I have reviewed the patient's chart and labs.  Questions were answered to the patient's satisfaction.     Quay Burow

## 2019-05-18 NOTE — Progress Notes (Signed)
TR band off. R radial level 0.

## 2019-05-18 NOTE — ED Provider Notes (Addendum)
[facility] EMERGENCY DEPARTMENT Provider Note   CSN: [id number] Arrival date & time: 05/17/19  2312    History   Chief Complaint Chief Complaint  Patient presents with  . Chest Pain    HPI Tabitha Schmidt is a 63 y.o. female.     HPI  This is a 63 year old female with a history of COPD, diabetes, hyperlipidemia, smoking, CAD who presents with chest pain.  Patient reports that she had similar symptoms earlier this week.  She was admitted to the hospital.  At that time she had an echocardiogram and a stress test that was low risk.  She was discharged home.  Patient reports she had one episode of chest pain early yesterday.  However pain recurred this evening and has been persistent.  It is anterior and to the left of the chest.  It is described as both aching and burning in nature.  She states she took nitro with some relief but that it came back and has stayed.  She reports no relief with Tums or antacids.  She did eat a spicy meal tonight.  She denies any nausea or vomiting.  She rates her pain at 8 out of 10.  She denies any lower extremity swelling, recent cough, fever.  I reviewed the patient's chart.  She was seen and evaluated with an observation admission.  At that time she had an echocardiogram that showed preserved EF.  She also had a stress test that was low risk.  There was a small inferior wall defect but no evidence of ischemia.  Past Medical History:  Diagnosis Date  . Anxiety   . Cataracts, bilateral    [name]  . COPD (chronic obstructive pulmonary disease) (HCC)   . Coronary artery disease    cardiac cath 2008  . DM (diabetes mellitus) (HCC)    "came off medicines with dr's permission"  . GERD (gastroesophageal reflux disease)   . Hyperlipidemia    stopped meds on her own  . Hypothyroid   . Shortness of breath     Patient Active Problem List   Diagnosis Date Noted  . Chest pain 05/18/2019  . Elevated troponin   . Hyperlipidemia   . Chronic obstructive  pulmonary disease (HCC)   . Chest pain in adult 05/13/2019  . Hyperglycemia 07/10/2015  . Vitamin B12 deficiency 05/06/2015  . Vitamin D deficiency 05/06/2015  . Chronic fatigue 12/16/2014  . Hypothyroidism 12/16/2014  . GERD (gastroesophageal reflux disease) 01/03/2012  . Plantar fasciitis 05/26/2011  . PLANTAR FACIITIS 11/24/2010    Past Surgical History:  Procedure Laterality Date  . ABDOMINAL HYSTERECTOMY    . BREAST SURGERY     removal of bilateral benign tumors  . CARDIAC CATHETERIZATION     2010  . CHOLECYSTECTOMY    . DILATION AND CURETTAGE OF UTERUS    . ESOPHAGOGASTRODUODENOSCOPY  01/26/2012   Procedure: ESOPHAGOGASTRODUODENOSCOPY (EGD);  Surgeon: Malissa Hippo, MD;  Location: [facility];  Service: Endoscopy;  Laterality: N/A;  730  . TUBAL LIGATION       OB History   No obstetric history on file.      Home Medications    Prior to Admission medications   Medication Sig Start Date End Date Taking? Authorizing Provider  aspirin EC 81 MG EC tablet Take 1 tablet (81 mg total) by mouth daily. 05/15/19   Vassie Loll, MD  atorvastatin (LIPITOR) 40 MG tablet Take 1 tablet (40 mg total) by mouth daily at 6 PM. 05/14/19  Vassie LollMadera, Carlos, MD  B COMPLEX-C-FOLIC ACID ER PO Take 1 capsule by mouth daily.    [provider]  cyanocobalamin (,VITAMIN B-12,) 1000 MCG/ML injection Inject 1 mL into the muscle every 30 (thirty) days. 11/22/18   [provider]  LORazepam (ATIVAN) 1 MG tablet Take 1 mg by mouth 4 (four) times daily.    [provider]  metoprolol tartrate (LOPRESSOR) 25 MG tablet Take 1 tablet (25 mg total) by mouth 2 (two) times daily. 05/14/19   Vassie LollMadera, Carlos, MD  pantoprazole (PROTONIX) 40 MG tablet Take 1 tablet (40 mg total) by mouth daily. 05/15/19   Vassie LollMadera, Carlos, MD  PROAIR HFA 108 [phone](90 Base) MCG/ACT inhaler Inhale 1 puff into the lungs 3 (three) times daily. 01/15/19   [provider]  SYMBICORT 160-4.5 MCG/ACT inhaler  Inhale 1 puff into the lungs daily. 01/15/19   [provider]  Thyroid (WP THYROID) 113.75 MG TABS Take 1 tab daily in AM. **PT NEEDS LAB APPT** Patient taking differently: Take 97.5 mg by mouth daily. Take 1 tab daily in AM. **PT NEEDS LAB APPT** 04/04/16   Carlus PavlovGherghe, Cristina, MD    Family History Family History  Problem Relation Age of Onset  . Anesthesia problems Neg Hx   . Hypotension Neg Hx   . Malignant hyperthermia Neg Hx   . Pseudochol deficiency Neg Hx     Social History Social History   Tobacco Use  . Smoking status: Current Every Day Smoker    Packs/day: 0.50    Years: 40.00    Pack years: 20.00    Types: Cigarettes  . Smokeless tobacco: Never Used  Substance Use Topics  . Alcohol use: No  . Drug use: No    Types: Cocaine     Allergies   Zegerid [omeprazole-sodium bicarbonate]   Review of Systems Review of Systems  Constitutional: Negative for fever.  Respiratory: Negative for shortness of breath.   Cardiovascular: Positive for chest pain. Negative for leg swelling.  Gastrointestinal: Negative for abdominal pain, diarrhea, nausea and vomiting.  Genitourinary: Negative for dysuria.  Musculoskeletal: Negative for back pain.  All other systems reviewed and are negative.    Physical Exam Updated Vital Signs BP 107/73   Pulse 70   Temp 98.7 F (37.1 C) (Oral)   Resp 20   Ht 1.715 m (5' 7.5")   Wt 81.6 kg   SpO2 96%   BMI 27.78 kg/m   Physical Exam Vitals signs and nursing note reviewed.  Constitutional:      Appearance: She is well-developed. She is obese. She is not ill-appearing.  HENT:     Head: Normocephalic and atraumatic.  Eyes:     Pupils: Pupils are equal, round, and reactive to light.  Neck:     Musculoskeletal: Neck supple.  Cardiovascular:     Rate and Rhythm: Normal rate and regular rhythm.     Heart sounds: Normal heart sounds.  Pulmonary:     Effort: Pulmonary effort is normal. No respiratory distress.     Breath  sounds: No wheezing.  Abdominal:     General: Bowel sounds are normal.     Palpations: Abdomen is soft.     Tenderness: There is abdominal tenderness. There is no guarding or rebound.  Musculoskeletal:     Right lower leg: No edema.     Left lower leg: No edema.  Skin:    General: Skin is warm and dry.  Neurological:     Mental Status: She  is alert and oriented to person, place, and time.  Psychiatric:        Mood and Affect: Mood normal.      ED Treatments / Results  Labs (all labs ordered are listed, but only abnormal results are displayed) Labs Reviewed  BASIC METABOLIC PANEL - Abnormal; Notable for the following components:      Result Value   Glucose, Bld 136 (*)    All other components within normal limits  TROPONIN I (HIGH SENSITIVITY) - Abnormal; Notable for the following components:   Troponin I (High Sensitivity) 226.00 (*)    All other components within normal limits  TROPONIN I (HIGH SENSITIVITY) - Abnormal; Notable for the following components:   Troponin I (High Sensitivity) 435.00 (*)    All other components within normal limits  SARS CORONAVIRUS 2 (HOSPITAL ORDER, PERFORMED IN [facility])  CBC WITH DIFFERENTIAL/PLATELET  LIPASE, BLOOD  HEPATIC FUNCTION PANEL  COMPREHENSIVE METABOLIC PANEL  CBC    EKG EKG Interpretation  Date/Time:  Friday May 17 2019 23:25:33 EDT Ventricular Rate:  73 PR Interval:    QRS Duration: 85 QT Interval:  395 QTC Calculation: 436 R Axis:   77 Text Interpretation:  Sinus rhythm Repol abnrm suggests ischemia, anterolateral T wave changes in I, aVL,  Nonspecific ST abnormality anteriorly Confirmed by Ross MarcusHorton, Keundra Petrucelli ([id number]) on 05/18/2019 12:35:28 AM   Radiology Dg Chest 2 View  Result Date: 05/18/2019 CLINICAL DATA:  63 year old female with chest pain. EXAM: CHEST - 2 VIEW COMPARISON:  Chest radiograph dated 05/13/2019 FINDINGS: There is changes of COPD with hyperexpansion of the lungs and flattening of the  diaphragms. No focal consolidation, pleural effusion, or pneumothorax. The cardiac silhouette is within normal limits. No acute osseous pathology. IMPRESSION: 1. No active cardiopulmonary disease. 2. COPD. Electronically Signed   By: Elgie CollardArash  Radparvar M.D.   On: 05/18/2019 00:19    Procedures Procedures (including critical care time)  CRITICAL CARE Performed by: Shon Batonourtney F Sanah Kraska   Total critical care time: 35 minutes  Critical care time was exclusive of separately billable procedures and treating other patients.  Critical care was necessary to treat or prevent imminent or life-threatening deterioration.  Critical care was time spent personally by me on the following activities: development of treatment plan with patient and/or surrogate as well as nursing, discussions with consultants, evaluation of patient's response to treatment, examination of patient, obtaining history from patient or surrogate, ordering and performing treatments and interventions, ordering and review of laboratory studies, ordering and review of radiographic studies, pulse oximetry and re-evaluation of patient's condition.   Medications Ordered in ED Medications  sucralfate (CARAFATE) 1 GM/10ML suspension 1 g (1 g Oral Given 05/18/19 0005)  enoxaparin (LOVENOX) injection 40 mg (has no administration in time range)  0.9 %  sodium chloride infusion (has no administration in time range)  acetaminophen (TYLENOL) tablet 650 mg (has no administration in time range)    Or  acetaminophen (TYLENOL) suppository 650 mg (has no administration in time range)  morphine 4 MG/ML injection 4 mg (4 mg Intravenous Given 05/18/19 0003)  ondansetron (ZOFRAN) injection 4 mg (4 mg Intravenous Given 05/18/19 0002)  aspirin tablet 325 mg (325 mg Oral Given 05/18/19 0044)  nitroGLYCERIN (NITROGLYN) 2 % ointment 1 inch (1 inch Topical Given 05/18/19 0044)  pantoprazole (PROTONIX) injection 40 mg (40 mg Intravenous Given 05/18/19 0144)      Initial Impression / Assessment and Plan / ED Course  I have reviewed the triage vital  signs and the nursing notes.  Pertinent labs & imaging results that were available during my care of the patient were reviewed by me and considered in my medical decision making (see chart for details).  Clinical Course as of May 17 153  Sat May 18, 2019  0116 Discussed with cardiology fellow, Kalman Shan.  He has reviewed the chart and agrees EKG changes are new.  However, recent negative stress test and relatively flat troponin with ongoing pain is somewhat reassuring.  Recommends admission to the hospitalist for trending.  Low threshold for transfer if troponin increases or pain does not improve for cardiac catheterization.   [CH]    Clinical Course User Index [CH] Brendon Christoffel, Barbette Hair, MD       Patient presents with chest pain.  Similar nature to the pain that she had earlier this week.  She essentially had a reassuring work-up from a cardiac standpoint.  She does have significant risk factors and a heart score 5 with also documented history of coronary artery disease on catheterization.  Those records are not in our system.  She is overall nontoxic-appearing and vital signs are reassuring.  EKG does show some nonspecific changes including T wave inversions in 1 and aVL and depression and biphasic T in anterior leads.  There is part of her clinical history that is suggestive of GI origin.  Patient was given morphine, Zofran, and Carafate.  Labs obtained.  Patient's troponin is 226.  During her hospitalization troponin was between 156 and 197.  This is a delta increase of approximately 30.  Patient's additional lab work is reassuring including lipase and LFTs.  She does have some epigastric tenderness but no signs of peritonitis.  She is a mixed picture.  Low risk for PE.  Given EKG changes and persistently elevated troponin, feel she warrants readmission for consideration for cardiac catheterization.  Will discuss  with cardiology.  See clinical course above.  Discussed with Dr. Maudie Mercury, hospitalist.  He will admit the patient.  Patient to be admitted to [facility] for potential cardiology evaluation.  1:54 AM Received phone call of repeat troponin of 435.  Hospitalist was updated.  Heparin was started.  Can continue with plan for transfer to [facility] for cardiology evaluation.  Final Clinical Impressions(s) / ED Diagnoses   Final diagnoses:  Atypical chest pain  Elevated troponin  NSTEMI (non-ST elevated myocardial infarction) [facility])    ED Discharge Orders    None       Deangleo Passage, Barbette Hair, MD 05/18/19 0134    Merryl Hacker, MD 05/18/19 0155    Merryl Hacker, MD 05/18/19 [phone]

## 2019-05-18 NOTE — Progress Notes (Addendum)
Consult from fellow early this AM, following up on rounds. 63 yo female history of anxiety, COPD, nonobstructive CAD by cath 2008. Recent admit with chest pain  and  nuclear stress 05/14/19 with no ischemia. 05/13/19 echo normal LVEF without WMAs. Readmitted with again with chest pain. Trop has trended up to 435 (up from admit just a few days ago), EKG lateral TWIs and ST depressions more prominent from prior admit. Symptoms have been significantly progressing since Monday, ongoing pain this morning  Admitted with NSTEMI. Medical therapy with ASA, atorva 80, hep gtt, lopressor 25mg bid, nitro gtt. Can consider ACE-I after cath. Already loaded with brillinta.   Discussed with Dr Berry on call for cath lab, will plan for cath today.    I have reviewed the risks, indications, and alternatives to cardiac catheterization, possible angioplasty, and stenting with the patient today. Risks include but are not limited to bleeding, infection, vascular injury, stroke, myocardial infection, arrhythmia, kidney injury, radiation-related injury in the case of prolonged fluoroscopy use, emergency cardiac surgery, and death. The patient understands the risks of serious complication is 1-2 in 1000 with diagnostic cardiac cath and 1-2% or less with angioplasty/stenting.   Niko Jakel MD 

## 2019-05-18 NOTE — Progress Notes (Signed)
ANTICOAGULATION CONSULT NOTE - Preliminary  Pharmacy Consult for heparin Indication: chest pain/ACS  Allergies  Allergen Reactions  . Zegerid [Omeprazole-Sodium Bicarbonate]     Irregular Heart Beat    Patient Measurements: Height: 5' 7.5" (171.5 cm) Weight: 180 lb (81.6 kg) IBW/kg (Calculated) : 62.75 HEPARIN DW (KG): 79.4   Vital Signs: Temp: 98.7 F (37.1 C) (07/17 2326) Temp Source: Oral (07/17 2326) BP: 107/73 (07/18 0130) Pulse Rate: 70 (07/18 0130)  Labs: Recent Labs    05/17/19 2337  HGB 13.7  HCT 41.4  PLT 265  CREATININE 0.85   Estimated Creatinine Clearance: 73.2 mL/min (by C-G formula based on SCr of 0.85 mg/dL).  Medical History: Past Medical History:  Diagnosis Date  . Anxiety   . Cataracts, bilateral    Gershon Crane  . COPD (chronic obstructive pulmonary disease) ([place])   . Coronary artery disease    cardiac cath 2008  . DM (diabetes mellitus) ([place])    "came off medicines with dr's permission"  . GERD (gastroesophageal reflux disease)   . Hyperlipidemia    stopped meds on her own  . Hypothyroid   . Shortness of breath     Medications:  Infusions:  . sodium chloride    . heparin    . nitroGLYCERIN      Assessment: 63 yo female being readmitted for chest pain. Was admitted 7/13 for same which resolved and was discharged on 7/14.  Not taking any anticoagulants at home, baseline labs ordered. Troponin 435, starting heparin and transferring to [facility] for cardiology evaluation.   Goal of Therapy:  Heparin level 0.3-0.7 units/ml   Plan:  Give 4000 units bolus x 1 Start heparin infusion at 1000 units/hr Check anti-Xa level in 6 hours and daily while on heparin Continue to monitor H&H and platelets  Dayrin Stallone, Magdalene Molly, RPH 05/18/2019,2:01 AM

## 2019-05-18 NOTE — Progress Notes (Signed)
Seen agree with plan of management as per my current Dr. Jani Gravel Appreciate cardiology input in this case-patient underwent cardiac catheterization as per Dr. Naida Sleight note and had PCI Repeat labs a.m.-keep on telemetry-if no other issues sounds like patient may be able to be discharged in morning  On exam BP 108/67   Pulse 69   Temp 97.7 F (36.5 C) (Oral)   Resp 13   Ht 5' 7.5" (1.715 m)   Wt 81.7 kg   SpO2 99%   BMI 27.79 kg/m  Pleasant coheent in nad sitting in bed already walked in unit No cp, sob, doe,pnd  No ict no pallor, chest clear no added sound no rales no rhonchi s1 s 2no m., nsr on monitors abd soft nt nd no rebound no guard Neuro intact Band noted on R wrist   p Await final recs from cardiology D/c home likely in am if all stable without recurrent angina  Verneita Griffes, MD [facility] 1:13 PM

## 2019-05-18 NOTE — H&P (View-Only) (Signed)
Consult from fellow early this AM, following up on rounds. 63 yo female history of anxiety, COPD, nonobstructive CAD by cath 2008. Recent admit with chest pain  and  nuclear stress 05/14/19 with no ischemia. 05/13/19 echo normal LVEF without WMAs. Readmitted with again with chest pain. Trop has trended up to 435 (up from admit just a few days ago), EKG lateral TWIs and ST depressions more prominent from prior admit. Symptoms have been significantly progressing since Monday, ongoing pain this morning  Admitted with NSTEMI. Medical therapy with ASA, atorva 80, hep gtt, lopressor 25mg  bid, nitro gtt. Can consider ACE-I after cath. Already loaded with brillinta.   Discussed with Dr Gwenlyn Found on call for cath lab, will plan for cath today.    I have reviewed the risks, indications, and alternatives to cardiac catheterization, possible angioplasty, and stenting with the patient today. Risks include but are not limited to bleeding, infection, vascular injury, stroke, myocardial infection, arrhythmia, kidney injury, radiation-related injury in the case of prolonged fluoroscopy use, emergency cardiac surgery, and death. The patient understands the risks of serious complication is 1-2 in 4627 with diagnostic cardiac cath and 1-2% or less with angioplasty/stenting.   Carlyle Dolly MD

## 2019-05-18 NOTE — Progress Notes (Addendum)
CARDIAC REHAB PHASE I   PRE:  Rate/Rhythm: 67 SR  BP:  Supine:  Sitting: 88/61 101/69 Standing:    SaO2: 99% RA  MODE:  Ambulation: 470 ft   POST:  Rate/Rhythm: 72   BP:  Supine:   Sitting: 96/85 Standing:    SaO2: 98% RA  [id number] Patient eager to walk and tolerated ambulation well with assist x1. Blood pressure initially low 88/61, and pt c/o feeling lightheaded. Patient sat a while longer and lightheadedness resolved. Recheck BP prior to ambulation was 101/69. No symptoms during or after ambulation, vital signs stable. MI/stent education reviewed including restrictions, CP, NTG use, and calling 911, heart healthy nutrition and activity guidelines. MI and stent card given. Discussed tobacco cessation, and pt states she's recently quit smoking, hand out given. Patient verbalizes understanding of instructions given. Discussed phase 2 cardiac rehab, and pt is interested in participating in the virtual cardiac rehab at [facility], referral sent.  Pt is interested in participating in Virtual Cardiac Rehab. Pt advised that Virtual Cardiac Rehab is provided at no cost to the patient.  Checklist:  1. Pt has smart device  ie smartphone and/or ipad for downloading an app  Yes         2. Reliable internet/wifi service    Yes 3. Understands how to use their smartphone and navigate within an app. Yes   Reviewed with pt the scheduling process for virtual cardiac rehab.  Pt verbalized understanding.  Sol Passer, MS, ACSM CEP

## 2019-05-18 NOTE — ED Notes (Signed)
Holding nitroglycerin for now per doctor Maudie Mercury, patient's blood pressure in the 06-26 systolic. Will continue to monitor

## 2019-05-18 NOTE — Consult Note (Signed)
Cardiology Consult    Patient ID: Tabitha Schmidt MRN: [id number], DOB/AGE: 07/15/1956   Admit date: 05/17/2019 Date of Consult: 05/18/2019  Primary Physician: Sinda Du, MD Primary Cardiologist: Kate Sable, MD Requesting Provider: Jani Gravel, MD  Patient Profile    Tabitha Schmidt is a 63 y.o. female with a history of anxiety, COPD, non-obstructive CAD (prox D1 60% on 2008 cath), GERD, HLD, smoking, hypothyroidism, and carotid stenosis (RIC high-grade). She is being seen today for the evaluation of chest pain and troponin elevation.  History of Present Illness    She was recently admitted earlier this week for 24 hours after presenting with chest pain and HS troponin that trended from 156 to 197. At that time she described substernal chest pain radiating to her left arm, worse with activity, and associated with nausea and diaphoresis. She had no ECG changes. TTE showed normal LVEF and wall motion, no significant valvular disease, mild LVH. She underwent nuclear stress test that was low risk and was discharged home 4 days ago on 7/14 on metoprolol as well as Lipitor for high-grade right IC stenosis also diagnosed on that admission.   She presented again last night to [facility] with recurrent substernal burning/sorness in her chest associated with slight nausea, "hot flashes", and mild dyspnea that started when she was feeding the chickens and progressively became worse when she came inside and drank cold water. It was not relieved with Rolaids or Mylanta and progressed to 10/10 pain at rest. She had some degree relief for about 20 minutes with SL nitro but the chest pain returned so she came to the ED. She was given full-dose aspirin, nitropaste, and morphine with improvement in her pain to a 3-4/10. ECG showed new lateral and septal TWI. HS troponin initially 226, then 435 so she was started on heparin and transferred to [facility]. She is currently on a nitro ggt at 5 mcg/min, limited by her  BP, and this morning her pain is starting to get worse again to 5/10.   She has smoked off and on over the years, but last quit 4 months ago for the most part. Her mother had a CABG at age 65 and her son had a "small" MI at age 17 - it seems he did undergo LHC but no PCI.   Past Medical History   Past Medical History:  Diagnosis Date   Anxiety    Cataracts, bilateral    Gershon Crane   COPD (chronic obstructive pulmonary disease) ([place])    Coronary artery disease    cardiac cath 2008   DM (diabetes mellitus) ([place])    "came off medicines with dr's permission"   GERD (gastroesophageal reflux disease)    Hyperlipidemia    stopped meds on her own   Hypothyroid    Shortness of breath     Past Surgical History:  Procedure Laterality Date   ABDOMINAL HYSTERECTOMY     BREAST SURGERY     removal of bilateral benign tumors   CARDIAC CATHETERIZATION     2010   CHOLECYSTECTOMY     DILATION AND CURETTAGE OF UTERUS     ESOPHAGOGASTRODUODENOSCOPY  01/26/2012   Procedure: ESOPHAGOGASTRODUODENOSCOPY (EGD);  Surgeon: Rogene Houston, MD;  Location: [facility];  Service: Endoscopy;  Laterality: N/A;  730   TUBAL LIGATION       Allergies  Allergen Reactions   Zegerid [Omeprazole-Sodium Bicarbonate]     Irregular Heart Beat   Inpatient Medications  sucralfate  1 g Oral TID WC & HS    Family History    Family History  Problem Relation Age of Onset   Anesthesia problems Neg Hx    Hypotension Neg Hx    Malignant hyperthermia Neg Hx    Pseudochol deficiency Neg Hx    She indicated that her mother is deceased. She indicated that her father is deceased. She indicated that her sister is alive. She reported the following about her brother: One deceased from ETOH, Two are alcoholics. She indicated that the status of her neg hx is unknown.   Social History    Social History   Socioeconomic History   Marital status: Single    Spouse name: Not on file   Number of  children: Not on file   Years of education: Not on file   Highest education level: Not on file  Occupational History   Not on file  Social Needs   Financial resource strain: Not on file   Food insecurity    Worry: Not on file    Inability: Not on file   Transportation needs    Medical: Not on file    Non-medical: Not on file  Tobacco Use   Smoking status: Current Every Day Smoker    Packs/day: 0.50    Years: 40.00    Pack years: 20.00    Types: Cigarettes   Smokeless tobacco: Never Used  Substance and Sexual Activity   Alcohol use: No   Drug use: No    Types: Cocaine   Sexual activity: Yes    Birth control/protection: Surgical  Lifestyle   Physical activity    Days per week: Not on file    Minutes per session: Not on file   Stress: Not on file  Relationships   Social connections    Talks on phone: Not on file    Gets together: Not on file    Attends religious service: Not on file    Active member of club or organization: Not on file    Attends meetings of clubs or organizations: Not on file    Relationship status: Not on file   Intimate partner violence    Fear of current or ex partner: Not on file    Emotionally abused: Not on file    Physically abused: Not on file    Forced sexual activity: Not on file  Other Topics Concern   Not on file  Social History Narrative   Not on file     Review of Systems    General:  No chills, fever, weight changes.  Cardiovascular:  See HPI. Dermatological: No rash, lesions/masses Respiratory: No cough Urologic: No hematuria, dysuria Abdominal:   +nausea. No vomiting, diarrhea, hematochezia, melena, or hematemesis Neurologic:  No visual changes, wkns, changes in mental status. All other systems reviewed and are otherwise negative except as noted above.  Physical Exam    Blood pressure (!) 99/52, pulse 72, temperature 98.7 F (37.1 C), temperature source Oral, resp. rate 17, height 5' 7.5" (1.715 m),  weight 81.6 kg, SpO2 95 %.    No intake or output data in the 24 hours ending 05/18/19 0238 Wt Readings from Last 3 Encounters:  05/17/19 81.6 kg  05/13/19 84.4 kg  01/05/19 74.8 kg    CONSTITUTIONAL: alert and conversant, sitting up in bed, slightly uncomfortable appearing but not in acute distress HEENT: normal CARDIOVASCULAR: Regular rhythm. No gallop, murmur, or rub. Normal S1/S2. Radial pulses 2+. No JVD.  PULMONARY/CHEST WALL: no deformities, normal breath sounds bilaterally, normal work of breathing ABDOMINAL: soft, non-tender, non-distended EXTREMITIES: no edema or muscle atrophy, warm and well-perfused SKIN: Dry and intact without apparent rashes or wounds. NEUROLOGIC: alert, no abnormal movements, cranial nerves grossly intact.   Labs    HS Troponin: 226, 435  Lab Results  Component Value Date   WBC 8.5 05/17/2019   HGB 13.7 05/17/2019   HCT 41.4 05/17/2019   MCV 92.2 05/17/2019   PLT 265 05/17/2019    Recent Labs  Lab 05/17/19 2337  NA 139  K 4.2  CL 102  CO2 27  BUN 22  CREATININE 0.85  CALCIUM 9.3  PROT 7.3  BILITOT 0.7  ALKPHOS 64  ALT 38  AST 30  GLUCOSE 136*   Lab Results  Component Value Date   CHOL 181 05/14/2019   HDL 35 (L) 05/14/2019   LDLCALC 113 (H) 05/14/2019   TRIG 167 (H) 05/14/2019   Lab Results  Component Value Date   DDIMER 0.51 (H) 05/13/2019     Radiology Studies    Dg Chest 2 View, 05/18/2019 FINDINGS: There is changes of COPD with hyperexpansion of the lungs and flattening of the diaphragms. No focal consolidation, pleural effusion, or pneumothorax. The cardiac silhouette is within normal limits. No acute osseous pathology.  IMPRESSION: 1. No active cardiopulmonary disease. 2. COPD.   NM Lexiscan SPECT, 05/14/2019  This is a low risk study.   The left ventricular ejection fraction is hyperdynamic (>65%).   Small inferior wall defect from apex to base. No ischemia. Normal functional images with EF 72% No RWMA. Most  likely related to motion artifact and diaphragmatic attenuation Low risk study    ECG & Cardiac Imaging    ECG: NSR, lateral TWI, > 32Allyn KWa77Korea2-5GarfiSchleicher County Medical CenterePrudy FeeOZ:>56Manson PasMMelene436mAllyn KWa41[place]orea7-3[place] [facility] Va Medical CenterSPrudy FeeOZ:>56Manson PasMMelene49mAllyn KWa(25Korea1)3ESouth Georgia Medical CenterxPrudy FeeOZ:>56Manson PasMMelene472mAllyn KWaKorea907MeKirby Medical CenterrPrudy FeeOZ:>56Manson PasMMelene458mAllyn KWa34Korea6-5[facility] Prudy FeeOZ:>56Manson PasMMelene464mAllyn KWa87[place]orea2-2[place] [facility] Surgery CenterSPrudy FeeOZ:>56Manson PasMMelene446mAllyn KWa87Korea0 1NorthwEast Georgia Regional Medical CenterePrudy FeeOZ:>56Manson PasMMelene462mAllyn KWaKorea910[facility]angeSurgicare [facility] Prudy FeeOZ:>56Manson PasMMelene482mAllyn KWa33Korea4-5Digestive Disease SpeSgmc Lanier CampuscPrudy FeeOZ:>56Manson PasMMelene474mAllyn KWa80Korea6-0[facility] Prudy FeeOZ:>56Manson PasMMelene466mAllyn KWa(81Korea4)0[facility] Forest HospitalSPrudy FeeOZ:>56Manson PasMMelene424mAllyn KWa(203Korea) 1[facility] Prudy FeeOZ:>56Manson PasMMelene490mAllyn KWa57Korea4-8HudsCypress Creek HospitaloPrudy FeeOZ:>56Manson PasMMelene468mAllyn KWa(57Korea3)3[facility] RehabVa Middle Tennessee Healthcare SystemiPrudy FeeOZ:>56Manson PasMMelene458mAllyn KWaKorea904Advanced EndSauk Prairie HospitaloPrudy FeeOZ:>56Manson PasMMelene439mAllyn KWa(41Korea8)4TheWestern Nevada Surgical Center Inc Prudy FeeOZ:>56Manson PasMMelene490mAllyn KWa67KorSt Elizabeth Physicians Endoscopy CenterePrudy FeeOZ:>56Manson PasMMelene421mAllyn KWa(254Korea) 2MedstAdvanced Urology Surgery CenteraPrudy FeeOZ:>56Manson PasMMelene420mAllyn KWa95Korea2-8ScottsdaNewport Hospital & Health ServiceslPrudy FeeOZ:>56Manson PasMMelene435mAllyn KWa(84Korea8)7R[facility] FL[facility] Regional Medical CenteroPrudy FeeOZ:>56Manson PasMMelene466mAllyn KWa34Korea7-0EndoscoHshs St Clare Memorial HospitalpPrudy FeeOZ:>56Manson PasMMelene420mAllyn KWa(70Korea7)5[place] [facility] More[facility] CitySPrudy FeeOZ:>56Manson PasMMelene448mAllyn KWaKorea917Memorial [facility] Medical Center Of South ArkansasePrudy FeeOZ:>56Manson PasMMelene420mAllyn KWa93Korea9[facility]-Prudy FeeOZ:>56Manson PasMMele34mAllyn KWa23Korea9-4Murray CallowCoordinated Health Orthopedic HospitalaPrudy FeeOZ:>56Manson PasMMelene444mAllyn KWaKorea367S[facility] A Pennsylvania HospitalePrudy FeeOZ:>56Manson PasMMelene432mAllyn KWaKorea435Inova Indianhead Med CtrFPrudy FeeOZ:>56Manson PasMMelene466mAllyn KWa(831Korea) 5[facility]Cap[facility] - [facility]uld Prudy FeeOZ:>56Manson PasMMelene455mAllyn KWa(85Korea6)2[facility] Prudy FeeOZ:>56Manson PasMMelene495mAllyn KWa82Korea8-2C[facility] ViTheda Oaks Gastroenterology And Endoscopy Center LLCsPrudy FeeOZ:>56Manson PasMMelene460mAllyn KWa40Korea3-2Kindred HoHosp Psiquiatrico CorreccionalsPrudy FeeOZ:>56Manson PasMMelene444mAllyn KWa(949Korea) 4TrModoc Medical CenteraPrudy FeeOZ:>56Manson PasMMelene450mAllyn KWa98Korea5-5[facility] Medical CentThe Georgia Center For YouthePrudy FeeOZ:>56Manson PasMMelene4109mAllyn KWa(920Korea) 8SebasticoSouth Garrison Sexually Violent Predator Treatment ProgramoPrudy FeeOZ:>56Manson PasMMelene4098-1191undospitalsonally reviewed.  Assessment & Plan    NSTEMI: presents with recurrent typical anginal chest pain occurring at rest. Troponin continues to trend up. ECG somewhat nonspecific, but in this clinical setting TWI could be consistent with ischemia. On personal review of her Lexiscan MPI, image processing was actually very poor. Given her age, smoking history, and diabetes, particularly in this clinical scenario, her recent "low risk" nuclear imaging may also a be false negative. She still has ongoing chest pain and will need urgent angiography this morning.  - Continue to trend troponin every 4 hours. - Repeat ECG now and monitor on telemetry. - Keep NPO for left heart cath this morning - Continue heparin infusion - Load Ticagrelor 180mg   - Aspirin 81mg  daily and high-intensity statin - Agree with nitro ggt as tolerated. - Morphine prn - will give another 2mg  IV now - Addition of beta blocker currently limited by BP - Limited TTE for LV function in the morning  Carotid artery stenosis: recent duplex study notable for vertebral and bilateral carotid disease, though high-grade stenosis of right IC. Needs vascular surgery follow-up, statin, aspirin.   Signed, Donnarae Rae W Ariell Gunnels, MD 05/18/2019, 2:38 AM  For questions or updates, please contact   Please consult [email or web address] for contact info under Cardiology/STEMI.

## 2019-05-18 NOTE — ED Notes (Signed)
Dr. Maudie Mercury told this nurse to hold nitroglycerin for now. Pt's pain is stable. Pain is a level 1 at this time. Blood pressure 107/62 and heart rate 64

## 2019-05-18 NOTE — ED Notes (Signed)
CRITICAL VALUE ALERT  Critical Value:  Troponin 226  Date & Time Notied:  05/18/2019 0034  Provider Notified: EDP Horton   Orders Received/Actions taken: No orders at this time

## 2019-05-18 NOTE — ED Notes (Signed)
Date and time results received: 05/18/19 0151 (use smartphrase ".now" to insert current time)  Test: troponin Critical Value: 435  Name of Provider Notified: Dr. Dina Rich  Orders Received? Or Actions Taken?: no/na

## 2019-05-18 NOTE — H&P (Signed)
TRH H&P    Patient Demographics:    Tabitha Schmidt, is a 63 y.o. female  MRN: [id number]  DOB - 11/26/1955  Admit Date - 05/17/2019  Referring MD/NP/PA: Ross Marcusourtney Horton  Outpatient Primary MD for the patient is Kari BaarsHawkins, Edward, MD [facility]  Patient coming from:  home  Chief complaint- chest pain   HPI:    Tabitha Schmidt  is a 63 y.o. female, w Anxiety, Copd, Hypothyroidism, Genella RifeGerd, Tobacco abuse, Hyperlipidemia, CAD (nonobstructive), cath 2008->60% diag lesion, Dm2 (diet controlled), w recent admission for chest pain w + trop (nuclear stress test low risk 05/13/2019), apparently presents with further chest pain "tightness" a "hurting" in the substernal area without radiation, associated with slight nausea.  Pt tried nitro at home with relief for about 20 minutes, and then chest pain came back.  Pt is currently still having chest pain even though had NTP. Pt received aspirin either by EMS or ED.   In ED, T 98.7, P 74  R 20 Bp 125/73 pox 97% on RA Wt 81.6 kg  CXR IMPRESSION: 1. No active cardiopulmonary disease. 2. COPD.  Wbc 8.5, Hgb 13.7, Plt 265  Na 139, K 4.2, Bun 22, Creatinine 0.85 Ast 30, Alt 38  Trop 226-> 435  Cardiology fellow notified of EKG changes by ED and requested medicine to admit  Pt will be admitted for chest pain  Reviewed cardiac catheterization 12/25/2006  HEMODYNAMIC RESULTS: 1. Aortic systolic pressure 127, diastolic pressure 64. 2. Left ventricular systolic pressure 127, end-diastolic pressure 14.  SELECTIVE CORONARY ANGIOGRAPHY: 1. Left main normal. 2. LAD: LAD gave off a large first diagonal branch which had a 60% segmental proximal stenosis. 3. Left circumflex; non-dominant and free of significant disease. 4. Right coronary artery; dominant and free of significant disease. 5. Left ventriculography; RAO left ventriculogram performed using 25  mL of Visipaque dye at 12 mL per second. The overall LVEF was estimated greater than 60% without focal wall motion abnormalities. 6. Distal abdominal aortography; performed using 20 mL of Visipaque dye at 20 mL per second. There appear to be a ostial left renal artery stenosis. Infrarenal abdominal aorta and iliac bifurcation appear free of significant atherosclerotic changes.   Review of systems:    In addition to the HPI above,  No Fever-chills, No Headache, No changes with Vision or hearing, No problems swallowing food or Liquids, No Cough or Shortness of Breath, No Abdominal pain, No Nausea or Vomiting, bowel movements are regular, No Blood in stool or Urine, No dysuria, No new skin rashes or bruises, No new joints pains-aches,  No new weakness, tingling, numbness in any extremity, No recent weight gain or loss, No polyuria, polydypsia or polyphagia, No significant Mental Stressors.  All other systems reviewed and are negative.    Past History of the following :    Past Medical History:  Diagnosis Date  . Anxiety   . Cataracts, bilateral    Nile RiggsShapiro  . COPD (chronic obstructive pulmonary disease) (HCC)   . Coronary artery disease    cardiac  cath 2008  . DM (diabetes mellitus) (HCC)    "came off medicines with dr's permission"  . GERD (gastroesophageal reflux disease)   . Hyperlipidemia    stopped meds on her own  . Hypothyroid   . Shortness of breath       Past Surgical History:  Procedure Laterality Date  . ABDOMINAL HYSTERECTOMY    . BREAST SURGERY     removal of bilateral benign tumors  . CARDIAC CATHETERIZATION     2010  . CHOLECYSTECTOMY    . DILATION AND CURETTAGE OF UTERUS    . ESOPHAGOGASTRODUODENOSCOPY  01/26/2012   Procedure: ESOPHAGOGASTRODUODENOSCOPY (EGD);  Surgeon: Malissa HippoNajeeb U Rehman, MD;  Location: [facility];  Service: Endoscopy;  Laterality: N/A;  730  . TUBAL LIGATION        Social History:      Social  History   Tobacco Use  . Smoking status: Current Every Day Smoker    Packs/day: 0.50    Years: 40.00    Pack years: 20.00    Types: Cigarettes  . Smokeless tobacco: Never Used  Substance Use Topics  . Alcohol use: No       Family History :     Family History  Problem Relation Age of Onset  . Anesthesia problems Neg Hx   . Hypotension Neg Hx   . Malignant hyperthermia Neg Hx   . Pseudochol deficiency Neg Hx        Home Medications:   Prior to Admission medications   Medication Sig Start Date End Date Taking? Authorizing Provider  aspirin EC 81 MG EC tablet Take 1 tablet (81 mg total) by mouth daily. 05/15/19   Vassie LollMadera, Carlos, MD  atorvastatin (LIPITOR) 40 MG tablet Take 1 tablet (40 mg total) by mouth daily at 6 PM. 05/14/19   Vassie LollMadera, Carlos, MD  B COMPLEX-C-FOLIC ACID ER PO Take 1 capsule by mouth daily.    [provider]  cyanocobalamin (,VITAMIN B-12,) 1000 MCG/ML injection Inject 1 mL into the muscle every 30 (thirty) days. 11/22/18   [provider]  LORazepam (ATIVAN) 1 MG tablet Take 1 mg by mouth 4 (four) times daily.    [provider]  metoprolol tartrate (LOPRESSOR) 25 MG tablet Take 1 tablet (25 mg total) by mouth 2 (two) times daily. 05/14/19   Vassie LollMadera, Carlos, MD  pantoprazole (PROTONIX) 40 MG tablet Take 1 tablet (40 mg total) by mouth daily. 05/15/19   Vassie LollMadera, Carlos, MD  PROAIR HFA 108 [phone](90 Base) MCG/ACT inhaler Inhale 1 puff into the lungs 3 (three) times daily. 01/15/19   [provider]  SYMBICORT 160-4.5 MCG/ACT inhaler Inhale 1 puff into the lungs daily. 01/15/19   [provider]  Thyroid (WP THYROID) 113.75 MG TABS Take 1 tab daily in AM. **PT NEEDS LAB APPT** Patient taking differently: Take 97.5 mg by mouth daily. Take 1 tab daily in AM. **PT NEEDS LAB APPT** 04/04/16   Carlus PavlovGherghe, Cristina, MD     Allergies:     Allergies  Allergen Reactions  . Zegerid [Omeprazole-Sodium Bicarbonate]     Irregular Heart Beat      Physical Exam:   Vitals  Blood pressure 122/72, pulse 77, temperature 98.7 F (37.1 C), temperature source Oral, resp. rate 17, height 5' 7.5" (1.715 m), weight 81.6 kg, SpO2 98 %.  1.  General: axoxo3  2. Psychiatric: euthymic  3. Neurologic: cn2-12 intact, reflexes 2+ symmetric, diffuse with no clonus, motor 5/5 in all 4 ext  4.  HEENMT:  Anicteric, pupils 1.44mm symmetric, direct, consensual, near intact Neck: no jvd, + right carotid bruit  5. Respiratory : CTAB  6. Cardiovascular : rrr s1, s2, no m/g/r  7. Gastrointestinal:  Abd: soft, nt, nd, +bs  8. Skin:  Ext: no c/c/e  9.Musculoskeletal:  Good ROM,   No adenopathy    Data Review:    CBC Recent Labs  Lab 05/13/19 0710 05/14/19 0129 05/17/19 2337  WBC 5.9 6.8 8.5  HGB 13.5 13.1 13.7  HCT 40.3 40.3 41.4  PLT 252 248 265  MCV 92.6 95.7 92.2  MCH 31.0 31.1 30.5  MCHC 33.5 32.5 33.1  RDW 12.3 12.2 12.4  LYMPHSABS 2.5  --  2.0  MONOABS 0.7  --  0.5  EOSABS 0.2  --  0.1  BASOSABS 0.0  --  0.0   ------------------------------------------------------------------------------------------------------------------  Results for orders placed or performed during the hospital encounter of 05/17/19 (from the past 48 hour(s))  CBC with Differential     Status: None   Collection Time: 05/17/19 11:37 PM  Result Value Ref Range   WBC 8.5 4.0 - 10.5 K/uL   RBC 4.49 3.87 - 5.11 MIL/uL   Hemoglobin 13.7 12.0 - 15.0 g/dL   HCT 16.1 09.6 - 04.5 %   MCV 92.2 80.0 - 100.0 fL   MCH 30.5 26.0 - 34.0 pg   MCHC 33.1 30.0 - 36.0 g/dL   RDW 40.9 81.1 - 91.4 %   Platelets 265 150 - 400 K/uL   nRBC 0.0 0.0 - 0.2 %   Neutrophils Relative % 68 %   Neutro Abs 5.8 1.7 - 7.7 K/uL   Lymphocytes Relative 24 %   Lymphs Abs 2.0 0.7 - 4.0 K/uL   Monocytes Relative 6 %   Monocytes Absolute 0.5 0.1 - 1.0 K/uL   Eosinophils Relative 1 %   Eosinophils Absolute 0.1 0.0 - 0.5 K/uL   Basophils Relative 1 %   Basophils Absolute  0.0 0.0 - 0.1 K/uL   Immature Granulocytes 0 %   Abs Immature Granulocytes 0.03 0.00 - 0.07 K/uL    Comment: Performed at [facility], [address] [place], [place] [address]  Basic metabolic panel     Status: Abnormal   Collection Time: 05/17/19 11:37 PM  Result Value Ref Range   Sodium 139 135 - 145 mmol/L   Potassium 4.2 3.5 - 5.1 mmol/L   Chloride 102 98 - 111 mmol/L   CO2 27 22 - 32 mmol/L   Glucose, Bld 136 (H) 70 - 99 mg/dL   BUN 22 8 - 23 mg/dL   Creatinine, Ser 6.21 0.44 - 1.00 mg/dL   Calcium 9.3 8.9 - 30.8 mg/dL   GFR calc non Af Amer >60 >60 mL/min   GFR calc Af Amer >60 >60 mL/min   Anion gap 10 5 - 15    Comment: Performed at [facility], [address] [place], [place] [address]  Troponin I (High Sensitivity)     Status: Abnormal   Collection Time: 05/17/19 11:37 PM  Result Value Ref Range   Troponin I (High Sensitivity) 226.00 (HH) <18 ng/L    Comment: CRITICAL RESULT CALLED TO, READ BACK BY AND VERIFIED WITH: LONG,H. AT 0031 ON  05/18/19 BY EVA  (NOTE) Elevated high sensitivity troponin I (hsTnI) values and significant  changes across serial measurements may suggest ACS but many other  chronic and acute conditions are known to elevate hsTnI results.  Refer to the Links section for  chest pain algorithms and additional  guidance. Performed at [facility], [address] [place], [place] [address]   Lipase, blood     Status: None   Collection Time: 05/17/19 11:37 PM  Result Value Ref Range   Lipase 42 11 - 51 U/L    Comment: Performed at [facility], [address] [place], [place] [address]  Hepatic function panel     Status: None   Collection Time: 05/17/19 11:37 PM  Result Value Ref Range   Total Protein 7.3 6.5 - 8.1 g/dL   Albumin 4.3 3.5 - 5.0 g/dL   AST 30 15 - 41 U/L   ALT 38 0 - 44 U/L   Alkaline Phosphatase 64 38 - 126 U/L   Total Bilirubin 0.7 0.3 - 1.2 mg/dL   Bilirubin, Direct 0.1 0.0 - 0.2 mg/dL   Indirect Bilirubin 0.6 0.3 - 0.9 mg/dL     Comment: Performed at [facility], [address] [place], [place] [address]  APTT     Status: None   Collection Time: 05/17/19 11:37 PM  Result Value Ref Range   aPTT 31 24 - 36 seconds    Comment: Performed at [facility], [address] [place], [place] [address]  Protime-INR     Status: None   Collection Time: 05/17/19 11:37 PM  Result Value Ref Range   Prothrombin Time 13.2 11.4 - 15.2 seconds   INR 1.0 0.8 - 1.2    Comment: (NOTE) INR goal varies based on device and disease states. Performed at [facility], [address] [place], [place] [address]   SARS Coronavirus 2 (CEPHEID - Performed in [facility] lab), Hosp Order     Status: None   Collection Time: 05/18/19 12:37 AM   Specimen: Nasopharyngeal Swab  Result Value Ref Range   SARS Coronavirus 2 NEGATIVE NEGATIVE    Comment: (NOTE) If result is NEGATIVE SARS-CoV-2 target nucleic acids are NOT DETECTED. The SARS-CoV-2 RNA is generally detectable in upper and lower  respiratory specimens during the acute phase of infection. The lowest  concentration of SARS-CoV-2 viral copies this assay can detect is 250  copies / mL. A negative result does not preclude SARS-CoV-2 infection  and should not be used as the sole basis for treatment or other  patient management decisions.  A negative result may occur with  improper specimen collection / handling, submission of specimen other  than nasopharyngeal swab, presence of viral mutation(s) within the  areas targeted by this assay, and inadequate number of viral copies  (<250 copies / mL). A negative result must be combined with clinical  observations, patient history, and epidemiological information. If result is POSITIVE SARS-CoV-2 target nucleic acids are DETECTED. The SARS-CoV-2 RNA is generally detectable in upper and lower  respiratory specimens dur ing the acute phase of infection.  Positive  results are indicative of active infection with SARS-CoV-2.  Clinical   correlation with patient history and other diagnostic information is  necessary to determine patient infection status.  Positive results do  not rule out bacterial infection or co-infection with other viruses. If result is PRESUMPTIVE POSTIVE SARS-CoV-2 nucleic acids MAY BE PRESENT.   A presumptive positive result was obtained on the submitted specimen  and confirmed on repeat testing.  While 2019 novel coronavirus  (SARS-CoV-2) nucleic acids may be present in the submitted sample  additional confirmatory testing may be necessary for epidemiological  and / or clinical management purposes  to differentiate between  SARS-CoV-2  and other Sarbecovirus currently known to infect humans.  If clinically indicated additional testing with an alternate test  methodology [phone]) is advised. The SARS-CoV-2 RNA is generally  detectable in upper and lower respiratory sp ecimens during the acute  phase of infection. The expected result is Negative. Fact Sheet for Patients:  BoilerBrush.com.cy Fact Sheet for Healthcare Providers: https://pope.com/ This test is not yet approved or cleared by the [place] FDA and has been authorized for detection and/or diagnosis of SARS-CoV-2 by FDA under an Emergency Use Authorization (EUA).  This EUA will remain in effect (meaning this test can be used) for the duration of the COVID-19 declaration under Section 564(b)(1) of the Act, 21 U.S.C. section 360bbb-3(b)(1), unless the authorization is terminated or revoked sooner. Performed at [facility], [address], [place], [place] [address]   Troponin I (High Sensitivity)     Status: Abnormal   Collection Time: 05/18/19  1:12 AM  Result Value Ref Range   Troponin I (High Sensitivity) 435.00 (HH) <18 ng/L    Comment: CRITICAL RESULT CALLED TO, READ BACK BY AND VERIFIED WITH: TALBOTT,T @ 0151 ON 05/18/19 BY JUW Performed at [facility], [address],  [place], [place] [address]     Chemistries  Recent Labs  Lab 05/13/19 0710 05/17/19 2337  NA 140 139  K 3.9 4.2  CL 105 102  CO2 23 27  GLUCOSE 106* 136*  BUN 20 22  CREATININE 0.54 0.85  CALCIUM 9.8 9.3  AST  --  30  ALT  --  38  ALKPHOS  --  64  BILITOT  --  0.7   ------------------------------------------------------------------------------------------------------------------  ------------------------------------------------------------------------------------------------------------------ GFR: Estimated Creatinine Clearance: 73.2 mL/min (by C-G formula based on SCr of 0.85 mg/dL). Liver Function Tests: Recent Labs  Lab 05/17/19 2337  AST 30  ALT 38  ALKPHOS 64  BILITOT 0.7  PROT 7.3  ALBUMIN 4.3   Recent Labs  Lab 05/17/19 2337  LIPASE 42   No results for input(s): AMMONIA in the last 168 hours. Coagulation Profile: Recent Labs  Lab 05/17/19 2337  INR 1.0   Cardiac Enzymes: No results for input(s): CKTOTAL, CKMB, CKMBINDEX, TROPONINI in the last 168 hours. BNP (last 3 results) No results for input(s): PROBNP in the last 8760 hours. HbA1C: No results for input(s): HGBA1C in the last 72 hours. CBG: No results for input(s): GLUCAP in the last 168 hours. Lipid Profile: No results for input(s): CHOL, HDL, LDLCALC, TRIG, CHOLHDL, LDLDIRECT in the last 72 hours. Thyroid Function Tests: No results for input(s): TSH, T4TOTAL, FREET4, T3FREE, THYROIDAB in the last 72 hours. Anemia Panel: No results for input(s): VITAMINB12, FOLATE, FERRITIN, TIBC, IRON, RETICCTPCT in the last 72 hours.  --------------------------------------------------------------------------------------------------------------- Urine analysis:    Component Value Date/Time   COLORURINE STRAW (A) 05/13/2019 0718   APPEARANCEUR CLEAR 05/13/2019 0718   LABSPEC 1.006 05/13/2019 0718   PHURINE 7.0 05/13/2019 0718   GLUCOSEU NEGATIVE 05/13/2019 0718   HGBUR NEGATIVE 05/13/2019 0718    BILIRUBINUR NEGATIVE 05/13/2019 0718   KETONESUR NEGATIVE 05/13/2019 0718   PROTEINUR NEGATIVE 05/13/2019 0718   UROBILINOGEN 0.2 02/02/2010 0846   NITRITE NEGATIVE 05/13/2019 0718   LEUKOCYTESUR NEGATIVE 05/13/2019 0718      Imaging Results:    Dg Chest 2 View  Result Date: 05/18/2019 CLINICAL DATA:  63 year old female with chest pain. EXAM: CHEST - 2 VIEW COMPARISON:  Chest radiograph dated 05/13/2019 FINDINGS: There is changes of COPD with hyperexpansion of the lungs and flattening of the  diaphragms. No focal consolidation, pleural effusion, or pneumothorax. The cardiac silhouette is within normal limits. No acute osseous pathology. IMPRESSION: 1. No active cardiopulmonary disease. 2. COPD. Electronically Signed   By: Anner Crete M.D.   On: 05/18/2019 00:19    EKG nsr at 73, nl axis, nl int, T inversion in v1,2 (new) ? Posterior ischemia ? STEMI     Assessment & Plan:    Principal Problem:   Chest pain Active Problems:   Hypothyroidism   Elevated troponin   Chronic obstructive pulmonary disease (HCC)  Chest pain , w elevated troponin  (unstable angina) H/o CAD (60% diag) Ekg suggestive of posterior ischemia Tele Trop I q2h x2 Check lipid, (prior hga1c (05/13/19) =5.3) Aspirin 325mg  po qday Increase Lipitor to 80mg  po qhs Cont Metoprolol 25mg  po bid Heparin GTT Nitro GTT Cardiology consult requested for evaluation of emergent cath  R carotid stenosis 80-99% Please consult vascular surgery or arrange outpatient follow up  Hypothyroidism Cont levothyroxine  Gerd Cont PPI  Copd Pt states only takes symbicort prn Albuterol HFA prn  Vitamin B12 deficiency Cont B12 as outpatient  Anxiety Cont Ativan 1mg  po q6h prn (pt states only takes 1-2x per day)  DVT Prophylaxis-   Heparin gtt  AM Labs Ordered, also please review Full Orders  Family Communication: Admission, patients condition and plan of care including tests being ordered have been discussed with  the patient who indicate understanding and agree with the plan and Code Status.  Code Status: FULL CODE,  Attempted to contact son but there was no response, left message pt will be transferred to [facility]  Admission status: Observation: Based on patients clinical presentation and evaluation of above clinical data, I have made determination that patient meets Observation criteria at this time.  May require change to inpatient status, depending upon results of cardiac catheterization  Time spent in minutes : 70 minutes   Jani Gravel M.D on 05/18/2019 at 2:21 AM

## 2019-05-19 LAB — CBC
HCT: 35.2 % — ABNORMAL LOW (ref 36.0–46.0)
Hemoglobin: 11.7 g/dL — ABNORMAL LOW (ref 12.0–15.0)
MCH: 31 pg (ref 26.0–34.0)
MCHC: 33.2 g/dL (ref 30.0–36.0)
MCV: 93.1 fL (ref 80.0–100.0)
Platelets: 222 10*3/uL (ref 150–400)
RBC: 3.78 MIL/uL — ABNORMAL LOW (ref 3.87–5.11)
RDW: 12.1 % (ref 11.5–15.5)
WBC: 8.5 10*3/uL (ref 4.0–10.5)
nRBC: 0 % (ref 0.0–0.2)

## 2019-05-19 LAB — RENAL FUNCTION PANEL
Albumin: 3.5 g/dL (ref 3.5–5.0)
Anion gap: 8 (ref 5–15)
BUN: 13 mg/dL (ref 8–23)
CO2: 23 mmol/L (ref 22–32)
Calcium: 8.8 mg/dL — ABNORMAL LOW (ref 8.9–10.3)
Chloride: 107 mmol/L (ref 98–111)
Creatinine, Ser: 0.67 mg/dL (ref 0.44–1.00)
GFR calc Af Amer: >60 mL/min (ref >60)
GFR calc non Af Amer: >60 mL/min (ref >60)
Glucose, Bld: 101 mg/dL — ABNORMAL HIGH (ref 70–99)
Phosphorus: 2.5 mg/dL (ref 2.5–4.6)
Potassium: 3.8 mmol/L (ref 3.5–5.1)
Sodium: 138 mmol/L (ref 135–145)

## 2019-05-19 MED ORDER — METOPROLOL TARTRATE 12.5 MG HALF TABLET
12.5000 mg | ORAL_TABLET | Freq: Two times a day (BID) | ORAL | Status: DC
  Administered 2019-05-19: 12.5 mg via ORAL
  Filled 2019-05-19: qty 1

## 2019-05-19 MED ORDER — METOPROLOL TARTRATE 25 MG PO TABS: 12.5000 mg | ORAL_TABLET | Freq: Two times a day (BID) | ORAL | 0 refills | Status: DC

## 2019-05-19 MED ORDER — ATORVASTATIN CALCIUM 80 MG PO TABS: 80.0000 mg | ORAL_TABLET | Freq: Every day | ORAL | 0 refills | Status: DC

## 2019-05-19 MED ORDER — TICAGRELOR 90 MG PO TABS: 90.0000 mg | ORAL_TABLET | Freq: Two times a day (BID) | ORAL | 0 refills | Status: DC

## 2019-05-19 NOTE — Progress Notes (Signed)
Pt informed RN that her son already picked up prescriptions. lab results paged to MD.

## 2019-05-19 NOTE — Discharge Summary (Signed)
Physician Discharge Summary  Constance HawMary D Nutter ZOX:[id number] DOB: 12/14/1955 DOA: 05/17/2019  PCP: Kari BaarsHawkins, Edward, MD  Admit date: 05/17/2019 Discharge date: 05/19/2019  Time spent: 22 minutes  Recommendations for Outpatient Follow-up:  1. New medications aspirin, Brilinta, lower dose of metoprolol 2. Needs Chem-12, CBC 1 week 3. Needs TSH 3 weeks 4. Carefully monitor blood pressure as outpatient was slightly hypotensive earlier in hospital stay  Discharge Diagnoses:  Principal Problem:   Chest pain Active Problems:   Hypothyroidism   Elevated troponin   Chronic obstructive pulmonary disease (HCC)   Non-ST elevation (NSTEMI) myocardial infarction [facility](HCC)   MI (myocardial infarction) [facility](HCC)   Discharge Condition: Improved  Diet recommendation: Heart healthy  Filed Weights   05/17/19 2321 05/18/19 0500 05/19/19 0606  Weight: 81.6 kg 81.7 kg 83.6 kg    History of present illness:  63 year old white female known bipolar hypothyroidism COPD reflux prior tobacco abuse CAD nonobstructive on cath 2008 with 60% diagonal lesion Recently [facility] with low risk Myoview stress test 05/13/2019 Re-presented to [facility] and found to have an STEMI On admission troponins bumped from 2 26-4 35 Cardiology saw the patient catheterized her and results as below Postprocedure she is doing very well has no recurrent chest pain her blood pressures are little bit low Cardiology recommended medications as per below I had a long discussion with her about metoprolol not only being for hypertension also being for remodeling of the heart she understands the need to take all his medications have counseled her regarding high risk of bleeding given she is on Brilinta in addition to aspirin and watch her stools and to get labs at her PCP office She maximally benefited from this hospital stay and is stable to discharge home today    Procedures: Cardiac catheterization  7/18 Conclusion    Mid RCA lesion is 100% stenosed.  A drug-eluting stent was successfully placed.  Post intervention, there is a 0% residual stenosis.  There is mild left ventricular systolic dysfunction.  LV end diastolic pressure is mildly elevated.  The left ventricular ejection fraction is 50-55% by visual estimate.    Consultations:  Cardiologist  Discharge Exam: Vitals:   05/19/19 0603 05/19/19 0606  BP: (!) 87/54 102/64  Pulse: 65 66  Resp:  18  Temp: 98 F (36.7 C)   SpO2: 95%     General: Awake very pleasant coherent sitting up in bed eating breakfast reading newspaper No reports of chest pain External ocular movements intact Throat soft supple No thyromegaly Chest clinically clear no added sound no rales no rhonchi No pedal edema S1-S2 no murmur rub or gallop monitors reviewed sinus rhythm Neurologically intact moving all 4 limbs euthymic and congruent *  Discharge Instructions   Discharge Instructions    AMB Referral to Cardiac Rehabilitation - Phase II   Complete by: As directed    Diagnosis: NSTEMI   After initial evaluation and assessments completed: Virtual Based Care may be provided alone or in conjunction with Phase 2 Cardiac Rehab based on patient barriers.: Yes   Amb Referral to Cardiac Rehabilitation   Complete by: As directed    Diagnosis:  Coronary Stents STEMI     After initial evaluation and assessments completed: Virtual Based Care may be provided alone or in conjunction with Phase 2 Cardiac Rehab based on patient barriers.: Yes   Diet - low sodium heart healthy   Complete by: As directed    Discharge instructions   Complete by:  As directed    Please take your medications as indicated including aspirin Brilinta and a lower dose of the Toprol As we discussed the Toprol is not just for blood pressure it helps prevent further damage to the heart and we will call this into your pharmacy [facility] My recommendation is  to watch yourself for bleeding because you are on multiple agents that contain your blood I would also recommend that you follow-up with cardiology as previously directed Good luck and got bless you have a good summer   Increase activity slowly   Complete by: As directed      Allergies as of 05/19/2019      Reactions   Zegerid [omeprazole-sodium Bicarbonate]    Irregular Heart Beat      Medication List    TAKE these medications   aspirin 81 MG EC tablet Take 1 tablet (81 mg total) by mouth daily.   atorvastatin 80 MG tablet Commonly known as: LIPITOR Take 1 tablet (80 mg total) by mouth daily at 6 PM. What changed:   medication strength  how much to take   cyanocobalamin 1000 MCG/ML injection Commonly known as: (VITAMIN B-12) Inject 1 mL into the muscle every 30 (thirty) days.   LORazepam 1 MG tablet Commonly known as: ATIVAN Take 1 mg by mouth 2 (two) times daily as needed for anxiety.   metoprolol tartrate 25 MG tablet Commonly known as: LOPRESSOR Take 0.5 tablets (12.5 mg total) by mouth 2 (two) times daily. What changed: how much to take   NALTREXONE HCL PO Take 4.5 mg by mouth every morning.   pantoprazole 40 MG tablet Commonly known as: PROTONIX Take 1 tablet (40 mg total) by mouth daily.   ProAir HFA 108 (90 Base) MCG/ACT inhaler Generic drug: albuterol Inhale 1 puff into the lungs every 6 (six) hours as needed for wheezing or shortness of breath.   Symbicort 160-4.5 MCG/ACT inhaler Generic drug: budesonide-formoterol Inhale 1 puff into the lungs daily as needed (shortness of breath and wheezing).   Thyroid 113.75 MG Tabs Commonly known as: WP Thyroid Take 1 tab daily in AM. **PT NEEDS LAB APPT** What changed: Another medication with the same name was removed. Continue taking this medication, and follow the directions you see here.   ticagrelor 90 MG Tabs tablet Commonly known as: BRILINTA Take 1 tablet (90 mg total) by mouth 2 (two) times daily.       Allergies  Allergen Reactions  . Zegerid [Omeprazole-Sodium Bicarbonate]     Irregular Heart Beat   Follow-up Information    [facility] Follow up.   Specialty: Cardiology Why: [facility] - the office will call you to arrange follow-up. Please call the office if you have not heard back within 3 days. Contact information: [address] [place] [id number] [phone]           The results of significant diagnostics from this hospitalization (including imaging, microbiology, ancillary and laboratory) are listed below for reference.    Significant Diagnostic Studies: Dg Chest 2 View  Result Date: 05/18/2019 CLINICAL DATA:  63 year old female with chest pain. EXAM: CHEST - 2 VIEW COMPARISON:  Chest radiograph dated 05/13/2019 FINDINGS: There is changes of COPD with hyperexpansion of the lungs and flattening of the diaphragms. No focal consolidation, pleural effusion, or pneumothorax. The cardiac silhouette is within normal limits. No acute osseous pathology. IMPRESSION: 1. No active cardiopulmonary disease. 2. COPD. Electronically Signed   By: Ceasar MonsArash  Radparvar M.D.  On: 05/18/2019 00:19   Dg Chest 2 View  Result Date: 05/13/2019 CLINICAL DATA:  Chest pain into left arm with some sob since last night/copd/diabetic/smoker/hx heart cath EXAM: CHEST - 2 VIEW COMPARISON:  02/18/2019. FINDINGS: Cardiac silhouette is normal in size. No mediastinal or hilar masses. No evidence of adenopathy. Lungs are hyperexpanded. Stable scarring in the upper lobes. No evidence of pneumonia or pulmonary edema. No pleural effusion or pneumothorax. Skeletal structures are intact. IMPRESSION: 1. COPD without acute cardiopulmonary disease. Electronically Signed   By: Amie Portlandavid  Ormond M.D.   On: 05/13/2019 08:06   [place] Carotid Bilateral  Result Date: 05/13/2019 CLINICAL DATA:  63 year old female with a right-sided carotid bruit EXAM: BILATERAL CAROTID DUPLEX ULTRASOUND TECHNIQUE:  Wallace CullensGray scale imaging, color Doppler and duplex ultrasound were performed of bilateral carotid and vertebral arteries in the neck. COMPARISON:  None. FINDINGS: Criteria: Quantification of carotid stenosis is based on velocity parameters that correlate the residual internal carotid diameter with NASCET-based stenosis levels, using the diameter of the distal internal carotid lumen as the denominator for stenosis measurement. The following velocity measurements were obtained: RIGHT ICA: 447/109 cm/sec CCA: 63/11 cm/sec SYSTOLIC ICA/CCA RATIO:  7.0 ECA:  189 cm/sec LEFT ICA: 193/36 cm/sec CCA: 75/21 cm/sec SYSTOLIC ICA/CCA RATIO:  2.6 ECA:  446 cm/sec RIGHT CAROTID ARTERY: Hypoechoic atherosclerotic plaque in the proximal internal carotid artery results in significant elevation of the peak systolic velocity consistent with a high-grade stenosis. There is evidence of spectral broadening and high velocity jetting. Additionally, there is significant elevation of the peak systolic velocity in the proximal external carotid artery consistent with a greater than 60% stenosis. RIGHT VERTEBRAL ARTERY:  Patent with antegrade flow. LEFT CAROTID ARTERY: Hypoechoic atherosclerotic plaque in the carotid bifurcation extending into the proximal internal and external carotid arteries. By peak systolic velocity criteria, the internal carotid stenosis falls in the 50-69% diameter range. Additionally, there is significant elevation of the peak systolic velocity in the proximal external carotid artery consistent with a greater than 70% diameter stenosis. LEFT VERTEBRAL ARTERY:  Patent with normal antegrade flow. IMPRESSION: 1. Severe (70-99%) stenosis proximal right internal carotid artery secondary to focal hypoechoic atherosclerotic plaque. 2. Moderate (50-69%) stenosis proximal left internal carotid artery secondary to hypoechoic atherosclerotic plaque. Please note that the degree of stenosis is likely near the lower end of the spectrum  secondary to exaggerated compensatory peak systolic velocities in the setting of a high-grade contralateral stenosis. 3. Bilateral external carotid artery stenoses noted incidentally. 4. The vertebral arteries are patent with normal antegrade flow. Signed, Sterling BigHeath K. McCullough, MD, RPVI [facility] Electronically Signed   By: Malachy MoanHeath  McCullough M.D.   On: 05/13/2019 14:34   Nm Myocar Multi W/spect W/wall Motion / Ef  Result Date: 05/14/2019  This is a low risk study.  The left ventricular ejection fraction is hyperdynamic (>65%).  Small inferior wall defect from apex to base. No ischemia. Normal functional images with EF 72% No RWMA. Most likely related to motion artifact and diaphragmatic attenuation Low risk study    Microbiology: Recent Results (from the past 240 hour(s))  SARS Coronavirus 2 ([place] - Performed in [facility] hospital lab), Hosp Order     Status: None   Collection Time: 05/13/19 11:43 AM   Specimen: Nasopharyngeal Swab  Result Value Ref Range Status   SARS Coronavirus 2 NEGATIVE NEGATIVE Final    Comment: (NOTE) If result is NEGATIVE SARS-CoV-2 target nucleic acids are NOT DETECTED. The SARS-CoV-2 RNA  is generally detectable in upper and lower  respiratory specimens during the acute phase of infection. The lowest  concentration of SARS-CoV-2 viral copies this assay can detect is 250  copies / mL. A negative result does not preclude SARS-CoV-2 infection  and should not be used as the sole basis for treatment or other  patient management decisions.  A negative result may occur with  improper specimen collection / handling, submission of specimen other  than nasopharyngeal swab, presence of viral mutation(s) within the  areas targeted by this assay, and inadequate number of viral copies  (<250 copies / mL). A negative result must be combined with clinical  observations, patient history, and epidemiological  information. If result is POSITIVE SARS-CoV-2 target nucleic acids are DETECTED. The SARS-CoV-2 RNA is generally detectable in upper and lower  respiratory specimens dur ing the acute phase of infection.  Positive  results are indicative of active infection with SARS-CoV-2.  Clinical  correlation with patient history and other diagnostic information is  necessary to determine patient infection status.  Positive results do  not rule out bacterial infection or co-infection with other viruses. If result is PRESUMPTIVE POSTIVE SARS-CoV-2 nucleic acids MAY BE PRESENT.   A presumptive positive result was obtained on the submitted specimen  and confirmed on repeat testing.  While 2019 novel coronavirus  (SARS-CoV-2) nucleic acids may be present in the submitted sample  additional confirmatory testing may be necessary for epidemiological  and / or clinical management purposes  to differentiate between  SARS-CoV-2 and other Sarbecovirus currently known to infect humans.  If clinically indicated additional testing with an alternate test  methodology [id number]) is advised. The SARS-CoV-2 RNA is generally  detectable in upper and lower respiratory sp ecimens during the acute  phase of infection. The expected result is Negative. Fact Sheet for Patients:  StrictlyIdeas.no Fact Sheet for Healthcare Providers: BankingDealers.co.za This test is not yet approved or cleared by the [place] FDA and has been authorized for detection and/or diagnosis of SARS-CoV-2 by FDA under an Emergency Use Authorization (EUA).  This EUA will remain in effect (meaning this test can be used) for the duration of the COVID-19 declaration under Section 564(b)(1) of the Act, 21 U.S.C. section 360bbb-3(b)(1), unless the authorization is terminated or revoked sooner. Performed at [facility], [address] [place], [place] [address]   SARS Coronavirus 2 ([place] -  Performed in [facility] hospital lab), Hosp Order     Status: None   Collection Time: 05/18/19 12:37 AM   Specimen: Nasopharyngeal Swab  Result Value Ref Range Status   SARS Coronavirus 2 NEGATIVE NEGATIVE Final    Comment: (NOTE) If result is NEGATIVE SARS-CoV-2 target nucleic acids are NOT DETECTED. The SARS-CoV-2 RNA is generally detectable in upper and lower  respiratory specimens during the acute phase of infection. The lowest  concentration of SARS-CoV-2 viral copies this assay can detect is 250  copies / mL. A negative result does not preclude SARS-CoV-2 infection  and should not be used as the sole basis for treatment or other  patient management decisions.  A negative result may occur with  improper specimen collection / handling, submission of specimen other  than nasopharyngeal swab, presence of viral mutation(s) within the  areas targeted by this assay, and inadequate number of viral copies  (<250 copies / mL). A negative result must be combined with clinical  observations, patient history, and epidemiological information. If result is POSITIVE SARS-CoV-2 target nucleic acids are DETECTED. The  SARS-CoV-2 RNA is generally detectable in upper and lower  respiratory specimens dur ing the acute phase of infection.  Positive  results are indicative of active infection with SARS-CoV-2.  Clinical  correlation with patient history and other diagnostic information is  necessary to determine patient infection status.  Positive results do  not rule out bacterial infection or co-infection with other viruses. If result is PRESUMPTIVE POSTIVE SARS-CoV-2 nucleic acids MAY BE PRESENT.   A presumptive positive result was obtained on the submitted specimen  and confirmed on repeat testing.  While 2019 novel coronavirus  (SARS-CoV-2) nucleic acids may be present in the submitted sample  additional confirmatory testing may be necessary for epidemiological  and / or clinical management  purposes  to differentiate between  SARS-CoV-2 and other Sarbecovirus currently known to infect humans.  If clinically indicated additional testing with an alternate test  methodology [phone]) is advised. The SARS-CoV-2 RNA is generally  detectable in upper and lower respiratory sp ecimens during the acute  phase of infection. The expected result is Negative. Fact Sheet for Patients:  BoilerBrush.com.cy Fact Sheet for Healthcare Providers: https://pope.com/ This test is not yet approved or cleared by the [place] FDA and has been authorized for detection and/or diagnosis of SARS-CoV-2 by FDA under an Emergency Use Authorization (EUA).  This EUA will remain in effect (meaning this test can be used) for the duration of the COVID-19 declaration under Section 564(b)(1) of the Act, 21 U.S.C. section 360bbb-3(b)(1), unless the authorization is terminated or revoked sooner. Performed at [facility], [address] [place], [place] [address]      Labs: Basic Metabolic Panel: Recent Labs  Lab 05/13/19 0710 05/17/19 2337  NA 140 139  K 3.9 4.2  CL 105 102  CO2 23 27  GLUCOSE 106* 136*  BUN 20 22  CREATININE 0.54 0.85  CALCIUM 9.8 9.3   Liver Function Tests: Recent Labs  Lab 05/17/19 2337  AST 30  ALT 38  ALKPHOS 64  BILITOT 0.7  PROT 7.3  ALBUMIN 4.3   Recent Labs  Lab 05/17/19 2337  LIPASE 42   No results for input(s): AMMONIA in the last 168 hours. CBC: Recent Labs  Lab 05/13/19 0710 05/14/19 0129 05/17/19 2337  WBC 5.9 6.8 8.5  NEUTROABS 2.5  --  5.8  HGB 13.5 13.1 13.7  HCT 40.3 40.3 41.4  MCV 92.6 95.7 92.2  PLT 252 248 265   Cardiac Enzymes: No results for input(s): CKTOTAL, CKMB, CKMBINDEX, TROPONINI in the last 168 hours. BNP: BNP (last 3 results) No results for input(s): BNP in the last 8760 hours.  ProBNP (last 3 results) No results for input(s): PROBNP in the last 8760  hours.  CBG: Recent Labs  Lab 05/18/19 0726  GLUCAP 102*       Signed:  Rhetta Mura MD   [facility] 05/19/2019, 8:05 AM

## 2019-05-19 NOTE — Care Management (Signed)
Patient given Brilinta card.  Patient's scripts sent to pharmacy with hours of 9a-11a.  Patient and nurse aware.  Patient states son can pick up medications before he comes here if necessary.

## 2019-05-19 NOTE — Progress Notes (Signed)
Progress Note  Patient Name: Tabitha Schmidt Date of Encounter: 05/19/2019  Primary Cardiologist: Kate Sable, MD   Subjective   No complaints  Inpatient Medications    Scheduled Meds: . aspirin  81 mg Oral Daily  . atorvastatin  80 mg Oral q1800  . [START ON 06/01/2019] cyanocobalamin  1,000 mcg Intramuscular Q30 days  . metoprolol tartrate  25 mg Oral BID  . morphine  2 mg Intravenous Once  . pantoprazole  40 mg Oral Daily  . sodium chloride flush  3 mL Intravenous Q12H  . sodium chloride flush  3 mL Intravenous Q12H  . sucralfate  1 g Oral TID WC & HS  . thyroid  90 mg Oral Daily  . ticagrelor  90 mg Oral BID   Continuous Infusions: . sodium chloride    . nitroGLYCERIN Stopped (05/18/19 0929)   PRN Meds: sodium chloride, acetaminophen **OR** acetaminophen, acetaminophen, albuterol, LORazepam, morphine injection, ondansetron (ZOFRAN) IV, ondansetron (ZOFRAN) IV, sodium chloride flush   Vital Signs    Vitals:   05/18/19 1406 05/18/19 2121 05/19/19 0603 05/19/19 0606  BP: 114/76 (!) 104/54 (!) 87/54 102/64  Pulse: 66 72 65 66  Resp:  14  18  Temp:  98.2 F (36.8 C) 98 F (36.7 C)   TempSrc:  Oral Oral   SpO2: 97% 97% 95%   Weight:    83.6 kg  Height:        Intake/Output Summary (Last 24 hours) at 05/19/2019 0727 Last data filed at 05/18/2019 2126 Gross per 24 hour  Intake 1426.15 ml  Output -  Net 1426.15 ml   Last 3 Weights 05/19/2019 05/18/2019 05/17/2019  Weight (lbs) 184 lb 4.9 oz 180 lb 1.9 oz 180 lb  Weight (kg) 83.6 kg 81.7 kg 81.647 kg      Telemetry    SR, short 4 beat NSVT - Personally Reviewed  ECG    n/a - Personally Reviewed  Physical Exam   GEN: No acute distress.   Neck: No JVD Cardiac: RRR, no murmurs, rubs, or gallops.  Respiratory: Clear to auscultation bilaterally. GI: Soft, nontender, non-distended  MS: No edema; No deformity. Neuro:  Nonfocal  Psych: Normal affect   Right radial site is soft, no hematoma. Normal  palpable pulse Labs    High Sensitivity Troponin:   Recent Labs  Lab 05/13/19 1143 05/13/19 1337 05/13/19 1548 05/17/19 2337 05/18/19 0112  TROPONINIHS 193.00* 187.00* 197.00* 226.00* 435.00*      Cardiac EnzymesNo results for input(s): TROPONINI in the last 168 hours. No results for input(s): TROPIPOC in the last 168 hours.   Chemistry Recent Labs  Lab 05/13/19 0710 05/17/19 2337  NA 140 139  K 3.9 4.2  CL 105 102  CO2 23 27  GLUCOSE 106* 136*  BUN 20 22  CREATININE 0.54 0.85  CALCIUM 9.8 9.3  PROT  --  7.3  ALBUMIN  --  4.3  AST  --  30  ALT  --  38  ALKPHOS  --  64  BILITOT  --  0.7  GFRNONAA >60 >60  GFRAA >60 >60  ANIONGAP 12 10     Hematology Recent Labs  Lab 05/13/19 0710 05/14/19 0129 05/17/19 2337  WBC 5.9 6.8 8.5  RBC 4.35 4.21 4.49  HGB 13.5 13.1 13.7  HCT 40.3 40.3 41.4  MCV 92.6 95.7 92.2  MCH 31.0 31.1 30.5  MCHC 33.5 32.5 33.1  RDW 12.3 12.2 12.4  PLT 252 248 265  BNPNo results for input(s): BNP, PROBNP in the last 168 hours.   DDimer  Recent Labs  Lab 05/13/19 0710  DDIMER 0.51*     Radiology    Dg Chest 2 View  Result Date: 05/18/2019 CLINICAL DATA:  63 year old female with chest pain. EXAM: CHEST - 2 VIEW COMPARISON:  Chest radiograph dated 05/13/2019 FINDINGS: There is changes of COPD with hyperexpansion of the lungs and flattening of the diaphragms. No focal consolidation, pleural effusion, or pneumothorax. The cardiac silhouette is within normal limits. No acute osseous pathology. IMPRESSION: 1. No active cardiopulmonary disease. 2. COPD. Electronically Signed   By: Elgie Collard M.D.   On: 05/18/2019 00:19    Cardiac Studies    Patient Profile     63 yo female history of anxiety, COPD, nonobstructive CAD by cath 2008. Recent admit with chest pain  and  nuclear stress 05/14/19 with no ischemia. 05/13/19 echo normal LVEF without WMAs. Readmitted with again with chest pain. Trop has trended up to 435 (up from admit  just a few days ago), EKG lateral TWIs and ST depressions more prominent from prior admit. Symptoms have been significantly progressing since Monday, ongoing pain this morning  Assessment & Plan    1. NSTEMI - cath showed occluded RCA, s/p DES. LVEF 50-55% by LV gram - medical therapy with ASA 81, atorva 80, lopresor 25mg  bid, brillinta 90mg  bid. Soft bp's at times, hold on starting ACE/ARB, lower lopressor to 12.5mg  bid. -  echo on 7/13 LVEF 60-65% during initial admission, LV gram 50-55% this admit after progression of her ischemia. WIll not repeat echo at this time.  - right radial site looks good  - f/u labs this AM, pending results likely d/c       For questions or updates, please contact [facility] Please consult [email or web address] for contact info under        Signed, Dina Rich, MD  05/19/2019, 7:27 AM

## 2019-05-19 NOTE — Progress Notes (Signed)
I have sent a message to our office's scheduling team requesting a 2 week TOC follow-up appointment in [place], and our office will call the patient with this information.  Kess Mcilwain PA-C

## 2019-05-19 NOTE — Progress Notes (Signed)
Pt stable, dc home via wheelchair 

## 2019-05-19 NOTE — Discharge Instructions (Signed)
Cardiac Rehabilitation What is cardiac rehabilitation? Cardiac rehabilitation is a treatment program that helps improve the health and well-being of people who have heart problems. Cardiac rehabilitation includes exercise training, education, and counseling to help you get stronger and return to an active lifestyle. This program can help you get better faster and reduce any future hospital stays. Why might I need cardiac rehabilitation? Cardiac rehabilitation programs can help when you have or have had:  A heart attack.  Heart failure.  Peripheral artery disease.  Coronary artery disease.  Angina.  Lung or breathing problems. Cardiac rehabilitation programs are also used when you have had:  Coronary artery bypass graft surgery.  Heart valve replacement.  Heart stent placement.  Heart transplant.  Aneurysm repair. What are the benefits of cardiac rehabilitation? Cardiac rehabilitation can help you:  Reduce problems like chest pain and trouble breathing.  Change risk factors that contribute to heart disease, such as: ? Smoking. ? High blood pressure. ? High cholesterol. ? Diabetes. ? Being inactive. ? Weighing over 30% more than your ideal weight. ? Diet.  Improve your emotional outlook so you feel: ? More hopeful. ? Better about yourself. ? More confident about taking care of yourself.  Get support from health experts as well as other people with similar problems.  Learn healthy ways to manage stress.  Learn how to manage and understand your medicines.  Teach your family about your condition and how to participate in your recovery. What happens in cardiac rehabilitation? You will be assessed by a cardiac rehabilitation team. They will check your health history and Aceyn Kathol a physical exam. You may need blood tests, exercise stress tests, and other evaluations to make sure that you are ready to start cardiac rehabilitation. The cardiac rehabilitation team works with  you to make a plan based on your health and goals. Your program will be tailored to fit you and your needs and may change as you progress. You may work with a health care team that includes:  Doctors.  Nurses.  Dietitians.  Psychologists.  Exercise specialists.  Physical and occupational therapists. What are the phases of cardiac rehabilitation? A cardiac rehabilitation program is often divided into phases. You advance from one phase to the next. Phase 1 This phase starts while you are still in the hospital. You may:  Start by walking in your room and then in the hall.  Dejay Kronk some simple exercises with a therapist.  Phase 2 This phase begins when you go home or to another facility. You will travel to a cardiac rehabilitation center or another place where rehabilitation is offered. This phase may last 8-12 weeks. During this phase:  You will slowly increase your activity level while being closely watched by a nurse or therapist.  You will have medical tests and exams to monitor your progress.  Your exercises may include strength or resistance training along with activities that cause your heart to beat faster (aerobic exercises), such as walking on a treadmill.  Your condition will determine how often and how long these sessions last.  You may learn how to: ? Lacinda Axon heart-healthy meals. ? Control your blood sugar, if this applies. ? Stop smoking. ? Manage your medicines. You may need help with scheduling or planning how and when to take your medicines. If you have questions about your medicines, it is very important that you talk with your health care provider.  Phase 3 This phase continues for the rest of your life. In this phase:  There  will be less supervision.  You may continue to participate in cardiac rehabilitation activities or become part of a group in your community.  You may benefit from talking about your experience with other people who are facing similar  challenges. Follow these instructions at home:  Take over-the-counter and prescription medicines only as told by your health care provider.  Keep all follow-up visits as told by your health care provider. This is important. Get help right away if:  You have severe chest discomfort, especially if the pain is crushing or pressure-like and spreads to your arms, back, neck, or jaw. Boluwatife Mutchler not wait to see if the pain will go away.  You have weakness or numbness in your face, arms, or legs, especially on one side of the body.  Your speech is slurred.  You are confused.  You have a sudden, severe headache or loss of vision.  You have shortness of breath.  You are sweating and have nausea.  You feel dizzy or faint.  You are fatigued. These symptoms may represent a serious problem that is an emergency. Chaunce Winkels not wait to see if the symptoms will go away. Get medical help right away. Call your local emergency services (911 in the [place] Alinna Siple not drive yourself to the hospital. Summary  Cardiac rehabilitation is a treatment program that helps improve the health and well-being of people who have heart problems.  A cardiac rehabilitation program is often divided into phases. You advance from one phase to the next.  The cardiac rehabilitation team works with you to make a plan based on your health and goals.  Cardiac rehabilitation includes exercise training, education, and counseling to help you get stronger and return to an active lifestyle. This information is not intended to replace advice given to you by your health care provider. Make sure you discuss any questions you have with your health care provider. Document Released: 07/26/2008 Document Revised: 02/06/2019 Document Reviewed: 08/16/2018 Elsevier Patient Education  2020 [place] heart is a muscle that needs oxygen to survive. A heart attack is a condition that occurs when your heart does not get enough oxygen.  When this happens, the heart muscle begins to die. This can cause permanent damage if not treated right away. A heart attack is a medical emergency. This condition may be called a myocardial infarction, or MI. It is also known as acute coronary syndrome (ACS). ACS is a term used to describe a group of conditions that affect blood flow to the heart. What are the causes? This condition may be caused by:  Atherosclerosis. This occurs when a fatty substance called plaque builds up in the arteries and blocks or reduces blood supply to the heart.  A blood clot. A blood clot can develop suddenly when plaque breaks up within an artery and blocks blood flow to the heart.  Low blood pressure.  An abnormal heartbeat (arrhythmia).  Conditions that cause a decrease of oxygen to the heart, such as anemiaorrespiratory failure.  A spasm, or severe tightening, of a blood vessel that cuts off blood flow to the heart.  Tearing of a coronary artery (spontaneous coronary artery dissection).  High blood pressure. What increases the risk? The following factors may make you more likely to develop this condition:  Aging. The older you are, the higher your risk.  Having a personal or family history of chest pain, heart attack, stroke, or narrowing of the arteries in the legs, arms, head, or stomach (  peripheral artery disease).  Being female.  Smoking.  Not getting regular exercise.  Being overweight or obese.  Having high blood pressure.  Having high cholesterol (hypercholesterolemia).  Having diabetes.  Drinking too much alcohol.  Using illegal drugs, such as cocaine or methamphetamine. What are the signs or symptoms? Symptoms of this condition may vary, depending on factors like gender and age. Symptoms may include:  Chest pain. It may feel like: ? Crushing or squeezing. ? Tightness, pressure, fullness, or heaviness.  Pain in the arm, neck, jaw, back, or upper body.  Shortness of  breath.  Heartburn or upset stomach.  Nausea.  Sudden cold sweats.  Feeling tired.  Sudden light-headedness. How is this diagnosed? This condition may be diagnosed through tests, such as:  Electrocardiogram (ECG) to measure the electrical activity of your heart.  Blood tests to check for cardiac markers. These chemicals are released by a damaged heart muscle.  A test to evaluate blood flow and heart function (coronary angiogram).  CT scan to see the heart more clearly.  A test to evaluate the pumping action of the heart (echocardiogram). How is this treated? A heart attack must be treated as soon as possible. Treatment may include:  Medicines to: ? Break up or dissolve blood clots (fibrinolytic therapy). ? Thin blood and help prevent blood clots. ? Treat blood pressure. ? Improve blood flow to the heart. ? Reduce pain. ? Reduce cholesterol.  Angioplasty and stent placement. These are procedures to widen a blocked artery and keep it open.  Coronary artery bypass graft, CABG, or open heart surgery. This enables blood to flow to the heart by going around the blocked part of the artery.  Oxygen therapy if needed.  Cardiac rehabilitation. This improves your health and well-being through exercise, education, and counseling. Follow these instructions at home: Medicines  Take over-the-counter and prescription medicines only as told by your health care provider.  Thomasene Dubow not take the following medicines unless your health care provider says it is okay to take them: ? NSAIDs, such as ibuprofen. ? Supplements that contain vitamin A, vitamin E, or both. ? Hormone replacement therapy that contains estrogen with or without progestin. Lifestyle   Sanjiv Castorena not use any products that contain nicotine or tobacco, such as cigarettes, e-cigarettes, and chewing tobacco. If you need help quitting, ask your health care provider.  Avoid secondhand smoke.  Exercise regularly. Ask your health care  provider about participating in a cardiac rehabilitation program that helps you start exercising safely after a heart attack.  Eat a heart-healthy diet. Your health care provider will tell you what foods to eat.  Maintain a healthy weight.  Learn ways to manage stress.  Alixandria Friedt not use illegal drugs. Alcohol use  Damariz Paganelli not drink alcohol if: ? Your health care provider tells you not to drink. ? You are pregnant, may be pregnant, or are planning to become pregnant.  If you drink alcohol: ? Limit how much you use to:  0-1 drink a day for women.  0-2 drinks a day for men. ? Be aware of how much alcohol is in your drink. In the [place] one drink equals one 12 oz bottle of beer (355 mL), one 5 oz glass of wine (148 mL), or one 1 oz glass of hard liquor (44 mL). General instructions  Work with your health care provider to manage any other conditions you have, such as high blood pressure or diabetes. These conditions affect your heart.  Get screened for depression, and  seek treatment if needed.  Keep your vaccinations up to date. Get the flu vaccine every year.  Keep all follow-up visits as told by your health care provider. This is important. Contact a health care provider if:  You feel overwhelmed or sad.  You have trouble doing your daily activities. Get help right away if:  You have sudden, unexplained discomfort in your chest, arms, back, neck, jaw, or upper body.  You have shortness of breath.  You suddenly start to sweat or your skin gets clammy.  You feel nauseous or you vomit.  You have unexplained tiredness or weakness.  You suddenly feel light-headed or dizzy.  You notice your heart starts to beat fast or feels like it is skipping beats.  You have blood pressure that is higher than 180/120. These symptoms may represent a serious problem that is an emergency. Krissy Orebaugh not wait to see if the symptoms will go away. Get medical help right away. Call your local emergency services  (911 in the [place] Raymonde Hamblin not drive yourself to the hospital. Summary  A heart attack, also called myocardial infarction, is a condition that occurs when your heart does not get enough oxygen. This is caused by anything that blocks or reduces blood flow to the heart.  Treatment is a combination of medicines and surgeries, if needed, to open the blocked arteries and restore blood flow to the heart.  A heart attack is an emergency. Get help right away if you have sudden discomfort in your chest, arms, back, neck, jaw, or upper body. Seek help if you feel nauseous, you vomit, or you feel light-headed or dizzy. This information is not intended to replace advice given to you by your health care provider. Make sure you discuss any questions you have with your health care provider. Document Released: 10/17/2005 Document Revised: 01/24/2019 Document Reviewed: 01/28/2019 Elsevier Patient Education  2020 [employer]  Coronary Angiogram With Stent, Care After This sheet gives you information about how to care for yourself after your procedure. Your health care provider may also give you more specific instructions. If you have problems or questions, contact your health care provider. What can I expect after the procedure? After your procedure, it is common to have:  Bruising in the area where a small, thin tube (catheter) was inserted. This usually fades within 1-2 weeks.  Blood collecting in the tissue (hematoma) that may be painful to the touch. It should usually decrease in size and tenderness within 1-2 weeks. Follow these instructions at home: Insertion area care  Rosea Dory not take baths, swim, or use a hot tub until your health care provider approves.  You may shower 24-48 hours after the procedure or as directed by your health care provider.  Follow instructions from your health care provider about how to take care of your incision. Make sure you: ? Wash your hands with soap and water before you  change your bandage (dressing). If soap and water are not available, use hand sanitizer. ? Change your dressing as told by your health care provider. ? Leave stitches (sutures), skin glue, or adhesive strips in place. These skin closures may need to stay in place for 2 weeks or longer. If adhesive strip edges start to loosen and curl up, you may trim the loose edges. Ceonna Frazzini not remove adhesive strips completely unless your health care provider tells you to Carrell Palmatier that.  Remove the bandage (dressing) and gently wash the catheter insertion site with plain soap and water.  Dennie Bible  the area dry with a clean towel. Koral Thaden not rub the area, because that may cause bleeding.  Lucylle Foulkes not apply powder or lotion to the incision area.  Check your incision area every day for signs of infection. Check for: ? More redness, swelling, or pain. ? More fluid or blood. ? Warmth. ? Pus or a bad smell. Activity  Amalia Edgecombe not drive for 24 hours if you were given a medicine to help you relax (sedative).  Anndrea Mihelich not lift anything that is heavier than 10 lb (4.5 kg) for 5 days after your procedure or as directed by your health care provider.  Ask your health care provider when it is okay for you: ? To return to work or school. ? To resume usual physical activities or sports. ? To resume sexual activity. Eating and drinking   Eat a heart-healthy diet. This should include plenty of fresh fruits and vegetables.  Avoid the following types of food: ? Food that is high in salt. ? Canned or highly processed food. ? Food that is high in saturated fat or sugar. ? [employer] food.  Limit alcohol intake to no more than 1 drink a day for non-pregnant women and 2 drinks a day for men. One drink equals 12 oz of beer, 5 oz of wine, or 1 oz of hard liquor. Lifestyle   Teonna Coonan not use any products that contain nicotine or tobacco, such as cigarettes and e-cigarettes. If you need help quitting, ask your health care provider.  Take steps to manage and  control your weight.  Get regular exercise.  Manage your blood pressure.  Manage other health problems, such as diabetes. General instructions  Take over-the-counter and prescription medicines only as told by your health care provider. Blood thinners may be prescribed after your procedure to improve blood flow through the stent.  If you need an MRI after your heart stent has been placed, be sure to tell the health care provider who orders the MRI that you have a heart stent.  Keep all follow-up visits as directed by your health care provider. This is important. Contact a health care provider if:  You have a fever.  You have chills.  You have increased bleeding from the catheter insertion area. Hold pressure on the area. Get help right away if:  You develop chest pain or shortness of breath.  You feel faint or you pass out.  You have unusual pain at the catheter insertion area.  You have redness, warmth, or swelling at the catheter insertion area.  You have drainage (other than a small amount of blood on the dressing) from the catheter insertion area.  The catheter insertion area is bleeding, and the bleeding does not stop after 30 minutes of holding steady pressure on the area.  You develop bleeding from any other place, such as from your rectum. There may be bright red blood in your urine or stool, or it may appear as black, tarry stool. This information is not intended to replace advice given to you by your health care provider. Make sure you discuss any questions you have with your health care provider. Document Released: 05/06/2005 Document Revised: 09/29/2017 Document Reviewed: 07/14/2016 Elsevier Patient Education  2020 [employer]

## 2019-05-20 ENCOUNTER — Encounter (HOSPITAL_COMMUNITY): Payer: Self-pay | Admitting: Cardiovascular Disease

## 2019-05-20 LAB — POCT ACTIVATED CLOTTING TIME: Activated Clotting Time: 340 seconds

## 2019-06-05 ENCOUNTER — Ambulatory Visit: Payer: Medicare Other | Admitting: Student

## 2019-06-05 ENCOUNTER — Encounter: Payer: Self-pay | Admitting: Student

## 2019-06-05 ENCOUNTER — Other Ambulatory Visit: Payer: Self-pay

## 2019-06-05 VITALS — BP 117/68 | HR 84 | Temp 97.9°F | Ht 67.5 in | Wt 180.0 lb

## 2019-06-05 DIAGNOSIS — E785 Hyperlipidemia, unspecified: Secondary | ICD-10-CM | POA: Diagnosis not present

## 2019-06-05 DIAGNOSIS — I2510 Atherosclerotic heart disease of native coronary artery without angina pectoris: Secondary | ICD-10-CM | POA: Diagnosis not present

## 2019-06-05 DIAGNOSIS — I6523 Occlusion and stenosis of bilateral carotid arteries: Secondary | ICD-10-CM | POA: Diagnosis not present

## 2019-06-05 DIAGNOSIS — Z720 Tobacco use: Secondary | ICD-10-CM

## 2019-06-05 MED ORDER — CHANTIX STARTING MONTH PAK 0.5 MG X 11 & 1 MG X 42 PO TABS: ORAL_TABLET | ORAL | 0 refills | Status: DC

## 2019-06-05 MED ORDER — ATORVASTATIN CALCIUM 80 MG PO TABS: 80.0000 mg | ORAL_TABLET | Freq: Every day | ORAL | 3 refills | Status: DC

## 2019-06-05 MED ORDER — TICAGRELOR 90 MG PO TABS: 90.0000 mg | ORAL_TABLET | Freq: Two times a day (BID) | ORAL | 11 refills | Status: DC

## 2019-06-05 NOTE — Patient Instructions (Signed)
Medication Instructions:  Your physician recommends that you continue on your current medications as directed. Please refer to the Current Medication list given to you today.  Start Chantix  If you need a refill on your cardiac medications before your next appointment, please call your pharmacy.   Lab work: NONE If you have labs (blood work) drawn today and your tests are completely normal, you will receive your results only by: Marland Kitchen MyChart Message (if you have MyChart) OR . A paper copy in the mail If you have any lab test that is abnormal or we need to change your treatment, we will call you to review the results.  Testing/Procedures: NONE   Follow-Up: At [facility], you and your health needs are our priority.  As part of our continuing mission to provide you with exceptional heart care, we have created designated Provider Care Teams.  These Care Teams include your primary Cardiologist (physician) and Advanced Practice Providers (APPs -  Physician Assistants and Nurse Practitioners) who all work together to provide you with the care you need, when you need it. You will need a follow up appointment in 3 months.  Please call our office 2 months in advance to schedule this appointment.  You may see Kate Sable, MD or one of the following Advanced Practice Providers on your designated Care Team:   Bernerd Pho, PA-C [facility]) . Ermalinda Barrios, PA-C ([place])  Any Other Special Instructions Will Be Listed Below (If Applicable). Thank you for choosing [facility]!

## 2019-06-05 NOTE — Progress Notes (Signed)
Cardiology Office Note    Date:  06/06/2019   ID:  LEAIRA ARWINE, DOB 01/27/1956, MRN [id number]  PCP:  Kari Baars, MD  Cardiologist: Prentice Docker, MD    Chief Complaint  Patient presents with  . Hospitalization Follow-up    History of Present Illness:    Tabitha Schmidt is a 63 y.o. female with past medical history of carotid artery stenosis, hypothyroidism, COPD, and continued tobacco use who presents to the office today for hospital follow-up.  She was initially admitted to [facility] from 7/13 -05/14/2019 for evaluation of chest pain. Her symptoms had initially resolved with Rolaids but later presented and radiated down her left arm.  EKG showed no acute changes and cyclic troponin values were flat, peaking at 197.  Echo showed a preserved EF of 60 to 65% with no regional wall motion abnormalities.  She underwent a Lexiscan Myoview which showed an inferior wall defect but no ischemia and was read as a low risk study. Carotid dopplers did show 70-99% RICA and 50-69% LICA stenosis with Vascular Surgery referral recommended.   She presented back to the ED on 05/17/2019 for recurrent chest pain and repeat EKG showed new lateral and septal TWI. Troponin values were elevated to 226 and 435, therefore she was started on IV Heparin and IV NTG and transferred to [facility] for a cardiac catheterization.  The day following admission, her repeat EKG showed more prominent ST depressions and she underwent an urgent cardiac catheterization. This was performed by Dr. Allyson Sabal and showed 100% mid-RCA stenosis which was treated with DES placement. She was continued on ASA, Brilinta, Atorvastatin 80mg  daily, and Lopressor 12.5mg  BID. Was discharged home on 05/19/2019.  In talking with the patient today, she reports overall doing well since her hospital stay. Has chronic dyspnea but feels like this might have worsened after initiation of Brilinta. Starting to improve at this time. She denies any recurrent  chest pain resembling what brought her to the ED. No recent orthopnea, PND, edema, or palpitations.  Reports good compliance with her current medication regimen. Denies missing any doses of ASA or Brilinta.   Still smoking 6-7 cigarettes on a daily basis which she was doing prior to admission. Had success with Chantix in the past.    Past Medical History:  Diagnosis Date  . Anxiety   . Cataracts, bilateral    [name]  . COPD (chronic obstructive pulmonary disease) (HCC)   . Coronary artery disease    a. s/p DES to mid-RCA in 05/2019  . DM (diabetes mellitus) (HCC)    "came off medicines with dr's permission"  . GERD (gastroesophageal reflux disease)   . Hyperlipidemia    stopped meds on her own  . Hypothyroid   . Shortness of breath     Past Surgical History:  Procedure Laterality Date  . ABDOMINAL HYSTERECTOMY    . BREAST SURGERY     removal of bilateral benign tumors  . CARDIAC CATHETERIZATION     2010  . CHOLECYSTECTOMY    . CORONARY STENT INTERVENTION N/A 05/18/2019   Procedure: CORONARY STENT INTERVENTION;  Surgeon: Runell Gess, MD;  Location: [facility] INVASIVE CV LAB;  Service: Cardiovascular;  Laterality: N/A;  . DILATION AND CURETTAGE OF UTERUS    . ESOPHAGOGASTRODUODENOSCOPY  01/26/2012   Procedure: ESOPHAGOGASTRODUODENOSCOPY (EGD);  Surgeon: Malissa Hippo, MD;  Location: [facility] ENDO SUITE;  Service: Endoscopy;  Laterality: N/A;  730  . LEFT HEART CATH AND CORONARY ANGIOGRAPHY N/A 05/18/2019  Procedure: LEFT HEART CATH AND CORONARY ANGIOGRAPHY;  Surgeon: Runell GessBerry, Jonathan J, MD;  Location: [facility] INVASIVE CV LAB;  Service: Cardiovascular;  Laterality: N/A;  . TUBAL LIGATION      Current Medications: Outpatient Medications Prior to Visit  Medication Sig Dispense Refill  . aspirin EC 81 MG EC tablet Take 1 tablet (81 mg total) by mouth daily. 30 tablet 3  . cyanocobalamin (,VITAMIN B-12,) 1000 MCG/ML injection Inject 1 mL into the muscle every 30 (thirty) days.    Marland Kitchen.  LORazepam (ATIVAN) 1 MG tablet Take 1 mg by mouth 2 (two) times daily as needed for anxiety.     . metoprolol tartrate (LOPRESSOR) 25 MG tablet Take 0.5 tablets (12.5 mg total) by mouth 2 (two) times daily. 60 tablet 0  . NALTREXONE HCL PO Take 4.5 mg by mouth every morning.    . pantoprazole (PROTONIX) 40 MG tablet Take 1 tablet (40 mg total) by mouth daily. 30 tablet 1  . PROAIR HFA 108 (90 Base) MCG/ACT inhaler Inhale 1 puff into the lungs every 6 (six) hours as needed for wheezing or shortness of breath.     . SYMBICORT 160-4.5 MCG/ACT inhaler Inhale 1 puff into the lungs daily as needed (shortness of breath and wheezing).     . Thyroid (WP THYROID) 113.75 MG TABS Take 1 tab daily in AM. **PT NEEDS LAB APPT** 45 tablet 1  . atorvastatin (LIPITOR) 80 MG tablet Take 1 tablet (80 mg total) by mouth daily at 6 PM. 60 tablet 0  . ticagrelor (BRILINTA) 90 MG TABS tablet Take 1 tablet (90 mg total) by mouth 2 (two) times daily. 60 tablet 0   No facility-administered medications prior to visit.      Allergies:   Zegerid [omeprazole-sodium bicarbonate]   Social History   Socioeconomic History  . Marital status: Single    Spouse name: Not on file  . Number of children: Not on file  . Years of education: Not on file  . Highest education level: Not on file  Occupational History  . Not on file  Social Needs  . Financial resource strain: Not on file  . Food insecurity    Worry: Not on file    Inability: Not on file  . Transportation needs    Medical: Not on file    Non-medical: Not on file  Tobacco Use  . Smoking status: Current Every Day Smoker    Packs/day: 0.50    Years: 40.00    Pack years: 20.00    Types: Cigarettes  . Smokeless tobacco: Never Used  Substance and Sexual Activity  . Alcohol use: No  . Drug use: No    Types: Cocaine  . Sexual activity: Yes    Birth control/protection: Surgical  Lifestyle  . Physical activity    Days per week: Not on file    Minutes per  session: Not on file  . Stress: Not on file  Relationships  . Social Musicianconnections    Talks on phone: Not on file    Gets together: Not on file    Attends religious service: Not on file    Active member of club or organization: Not on file    Attends meetings of clubs or organizations: Not on file    Relationship status: Not on file  Other Topics Concern  . Not on file  Social History Narrative  . Not on file     Family History:  The patient's family history includes CAD in  her father.   Review of Systems:   Please see the history of present illness.     General:  No chills, fever, night sweats or weight changes.  Cardiovascular:  No chest pain, edema, orthopnea, palpitations, paroxysmal nocturnal dyspnea. Positive for dyspnea on exertion.  Dermatological: No rash, lesions/masses Respiratory: No cough, dyspnea Urologic: No hematuria, dysuria Abdominal:   No nausea, vomiting, diarrhea, bright red blood per rectum, melena, or hematemesis Neurologic:  No visual changes, wkns, changes in mental status. All other systems reviewed and are otherwise negative except as noted above.   Physical Exam:    VS:  BP 117/68 (BP Location: Left Arm)   Pulse 84   Temp 97.9 F (36.6 C)   Ht 5' 7.5" (1.715 m)   Wt 180 lb (81.6 kg)   BMI 27.78 kg/m    General: Well developed, well nourished,female appearing in no acute distress. Head: Normocephalic, atraumatic, sclera non-icteric, no xanthomas, nares are without discharge.  Neck: Right carotid bruit. JVD not elevated.  Lungs: Respirations regular and unlabored, without wheezes or rales.  Heart: Regular rate and rhythm. No S3 or S4.  No murmur, no rubs, or gallops appreciated. Abdomen: Soft, non-tender, non-distended with normoactive bowel sounds. No hepatomegaly. No rebound/guarding. No obvious abdominal masses. Msk:  Strength and tone appear normal for age. No joint deformities or effusions. Extremities: No clubbing or cyanosis. No edema.   Distal pedal pulses are 2+ bilaterally. Radial cath site stable without ecchymosis or evidence of a hematoma.  Neuro: Alert and oriented X 3. Moves all extremities spontaneously. No focal deficits noted. Psych:  Responds to questions appropriately with a normal affect. Skin: No rashes or lesions noted  Wt Readings from Last 3 Encounters:  06/05/19 180 lb (81.6 kg)  05/19/19 184 lb 4.9 oz (83.6 kg)  05/13/19 186 lb (84.4 kg)     Studies/Labs Reviewed:   EKG:  EKG is not ordered today.    Recent Labs: 05/13/2019: TSH 0.152 05/17/2019: ALT 38 05/19/2019: BUN 13; Creatinine, Ser 0.67; Hemoglobin 11.7; Platelets 222; Potassium 3.8; Sodium 138   Lipid Panel    Component Value Date/Time   CHOL 181 05/14/2019 0129   TRIG 167 (H) 05/14/2019 0129   HDL 35 (L) 05/14/2019 0129   CHOLHDL 5.2 05/14/2019 0129   VLDL 33 05/14/2019 0129   LDLCALC 113 (H) 05/14/2019 0129    Additional studies/ records that were reviewed today include:   Echocardiogram: 05/13/2019 IMPRESSIONS    1. The left ventricle has normal systolic function with an ejection fraction of 60-65%. The cavity size was normal. There is mild concentric left ventricular hypertrophy. Left ventricular diastolic Doppler parameters are consistent with impaired  relaxation. No evidence of left ventricular regional wall motion abnormalities.  2. The right ventricle has normal systolic function. The cavity was normal. There is no increase in right ventricular wall thickness.  3. The mitral valve is grossly normal. Mild thickening of the mitral valve leaflet.  4. The tricuspid valve is grossly normal.  5. The aortic valve is tricuspid.  6. The aortic root is normal in size and structure.  NST: 05/14/2019  This is a low risk study.  The left ventricular ejection fraction is hyperdynamic (>65%).   Small inferior wall defect from apex to base. No ischemia. Normal functional images with EF 72% No RWMA. Most likely related to motion  artifact and diaphragmatic attenuation Low risk study  Cardiac Catheterization: 05/18/2019  Mid RCA lesion is 100% stenosed.  A drug-eluting stent  was successfully placed.  Post intervention, there is a 0% residual stenosis.  There is mild left ventricular systolic dysfunction.  LV end diastolic pressure is mildly elevated.  The left ventricular ejection fraction is 50-55% by visual estimate.  IMPRESSION: Successful PCI and drug-eluting stent of a dominant mid RCA occlusion with grade 1 left-to-right collaterals in the setting of non-STEMI and ongoing chest pain despite IV heparin and nitroglycerin.  Her left system was free of disease.  Her EF was preserved in the 50% range with moderate inferior hypokinesia.  She will need uninterrupted dual antiplatelet therapy for 12 months.  Cardiac risk factor modification including smoking cessation, high-dose statin therapy.  The patient can most likely be discharged home tomorrow.   Assessment:    1. Coronary artery disease involving native coronary artery of native heart without angina pectoris   2. Bilateral carotid artery stenosis   3. Hyperlipidemia LDL goal <70   4. Tobacco use      Plan:   In order of problems listed above:  1. CAD - she recently had a false negative Myoview and presented back with recurrent chest pain and cardiac catheterization showed 100% mid-RCA stenosis which was treated with DES placement. - she has overall done well since discharge and denies any recurrent chest pain. She is unable to attend cardiac rehab due to babysitting her grandchildren so we reviewed ways to gradually increase her physical activity at home.  - continue ASA, Brilinta, statin and BB therapy. She was having dyspnea following initiation of Brilinta but says this has improved. Reviewed to try taking the medication with caffeine. If her dyspnea does not resolve, could switch to Plavix in the future.   2. Carotid Artery Stenosis - recent  carotid dopplers showed 70-99% RICA and 50-69% LICA stenosis. Reviewed in detail with the patient today as she reported being unaware of this. Referral entered to Vascular Surgery.  - continue ASA and statin therapy.   3. HLD - FLP during recent admission showed total cholesterol of 181, HDL 35, and LDL 113. She has ben started on Atorvastatin 80mg  daily. Will need repeat FLP and LFT's in 6-8 weeks. She strongly wishes to come off of statin therapy but we reviewed the indication for it at this time. I am hopeful she will remain compliant with it. She has also made several dietary changes and was congratulated on this.   4. Tobacco Use - she is still smoking 6-7 cigarettes daily. Had success with Chantix in the past and she requests an Rx for this again. Will provide. Reviewed possible side effects to the medication and informed her to make us aware if intolerant to the medication.    Medication Adjustments/Labs and Tests Ordered: Current medicines are reviewed at length with the patient today.  Concerns regarding medicines are outlined above.  Medication changes, Labs and Tests ordered today are listed in the Patient Instructions below. Patient Instructions  Medication Instructions:  Your physician recommends that you continue on your current medications as directed. Please refer to the Current Medication list given to you today.  Start Chantix  If you need a refill on your cardiac medications before your next appointment, please call your pharmacy.   Lab work: NONE If you have labs (blood work) drawn today and your tests are completely normal, you will receive your results only by: Marland Kitchen. MyChart Message (if you have MyChart) OR . A paper copy in the mail If you have any lab test that is abnormal or we  need to change your treatment, we will call you to review the results.  Testing/Procedures: NONE   Follow-Up: At [facility], you and your health needs are our priority.  As part of our  continuing mission to provide you with exceptional heart care, we have created designated Provider Care Teams.  These Care Teams include your primary Cardiologist (physician) and Advanced Practice Providers (APPs -  Physician Assistants and Nurse Practitioners) who all work together to provide you with the care you need, when you need it. You will need a follow up appointment in 3 months.  Please call our office 2 months in advance to schedule this appointment.  You may see Prentice DockerSuresh Koneswaran, MD or one of the following Advanced Practice Providers on your designated Care Team:   Randall AnBrittany Cressida Milford, PA-C [facility]([place] Office) . Jacolyn ReedyMichele Lenze, PA-C [facility]([place] Office)  Any Other Special Instructions Will Be Listed Below (If Applicable). Thank you for choosing [facility]!     Signed, Ellsworth LennoxBrittany M Angelino Rumery, PA-C  06/06/2019 9:10 AM    [facility] [address] [place], [place] [address] Phone: [phone] Fax: [phone]

## 2019-06-06 ENCOUNTER — Encounter: Payer: Self-pay | Admitting: Student

## 2019-06-14 ENCOUNTER — Telehealth: Payer: Self-pay | Admitting: Student

## 2019-06-14 MED ORDER — CLOPIDOGREL BISULFATE 75 MG PO TABS: ORAL_TABLET | ORAL | 3 refills | Status: DC

## 2019-06-14 NOTE — Telephone Encounter (Signed)
    Yes, if still having dyspnea would stop Brilinta and switch to Plavix. She will need to take 300mg  (4 of the 75mg  tablets) on the first day then 75mg  daily afterwards.   Signed, Erma Heritage, PA-C 06/14/2019, 1:51 PM Pager: [phone]

## 2019-06-14 NOTE — Telephone Encounter (Signed)
Please give pt a call --she's having SOB and is thinking it's from the ticagrelor (BRILINTA) 90 MG TABS tablet [[id number]], states she and B. Strader spoke about this at her LOV. She's needing a refill and would like to know if B. Ahmed Prima would like to start her on something else because the SOB is not going away.

## 2019-06-14 NOTE — Telephone Encounter (Signed)
Pt will stop Brilinta, start plavix

## 2019-07-19 ENCOUNTER — Telehealth (HOSPITAL_COMMUNITY): Payer: Self-pay

## 2019-07-19 NOTE — Telephone Encounter (Signed)

## 2019-07-22 ENCOUNTER — Ambulatory Visit (HOSPITAL_COMMUNITY)
Admission: RE | Admit: 2019-07-22 | Discharge: 2019-07-22 | Disposition: A | Discharge status: Home / Self Care | Payer: Medicare Other | Source: Ambulatory Visit | Attending: Surgery | Admitting: Surgery

## 2019-07-22 ENCOUNTER — Encounter: Payer: Self-pay | Admitting: Surgery

## 2019-07-22 ENCOUNTER — Other Ambulatory Visit: Payer: Self-pay

## 2019-07-22 ENCOUNTER — Ambulatory Visit (INDEPENDENT_AMBULATORY_CARE_PROVIDER_SITE_OTHER): Payer: Medicare Other | Admitting: Surgery

## 2019-07-22 VITALS — BP 137/67 | HR 81 | Temp 97.4°F | Resp 20 | Ht 67.5 in | Wt 180.0 lb

## 2019-07-22 DIAGNOSIS — I6529 Occlusion and stenosis of unspecified carotid artery: Secondary | ICD-10-CM | POA: Diagnosis not present

## 2019-07-22 DIAGNOSIS — I6523 Occlusion and stenosis of bilateral carotid arteries: Secondary | ICD-10-CM | POA: Diagnosis not present

## 2019-07-22 NOTE — Progress Notes (Signed)
[facility]  Patient name: Tabitha Schmidt MRN: [id number] DOB: 02/06/1956 Sex: female   REQUESTING PROVIDER:    Dr. Allyson SabalBerry    REASON FOR CONSULT:    Carotid stenosis  HISTORY OF PRESENT ILLNESS:   Tabitha Schmidt is a 63 y.o. female, who is referred today for evaluation of carotid stenosis.  This was initially detected at [facility] over the summer.  She ended up having a NSTEMI and was treated with DES.  She is not having any neurologic symptoms.  Specifically she denies numbness or weakness in either extremity.  She denies slurred speech.  She denies amaurosis fugax.  She was started on Brilinta but did not tolerate it and so she was switched to aspirin and Plavix.  The patient does take a statin for hypercholesterolemia.  She has been diagnosed with diabetes but is not on medications.  She is medically managed for hypertension.  She is a current smoker.  PAST MEDICAL HISTORY    Past Medical History:  Diagnosis Date  . Anxiety   . Cataracts, bilateral    Nile RiggsShapiro  . COPD (chronic obstructive pulmonary disease) (HCC)   . Coronary artery disease    a. s/p DES to mid-RCA in 05/2019  . DM (diabetes mellitus) (HCC)    "came off medicines with dr's permission"  . GERD (gastroesophageal reflux disease)   . Hyperlipidemia    stopped meds on her own  . Hypothyroid   . Shortness of breath      FAMILY HISTORY   Family History  Problem Relation Age of Onset  . CAD Father   . Anesthesia problems Neg Hx   . Hypotension Neg Hx   . Malignant hyperthermia Neg Hx   . Pseudochol deficiency Neg Hx     SOCIAL HISTORY:   Social History   Socioeconomic History  . Marital status: Single    Spouse name: Not on file  . Number of children: Not on file  . Years of education: Not on file  . Highest education level: Not on file  Occupational History  . Not on file  Social Needs  . Financial resource strain: Not  on file  . Food insecurity    Worry: Not on file    Inability: Not on file  . Transportation needs    Medical: Not on file    Non-medical: Not on file  Tobacco Use  . Smoking status: Current Every Day Smoker    Packs/day: 0.50    Years: 40.00    Pack years: 20.00    Types: Cigarettes  . Smokeless tobacco: Never Used  Substance and Sexual Activity  . Alcohol use: No  . Drug use: No    Types: Cocaine  . Sexual activity: Yes    Birth control/protection: Surgical  Lifestyle  . Physical activity    Days per week: Not on file    Minutes per session: Not on file  . Stress: Not on file  Relationships  . Social Musicianconnections    Talks on phone: Not on file    Gets together: Not on file    Attends religious service: Not on file    Active member of club or organization: Not on file    Attends meetings of clubs or organizations: Not on file    Relationship status: Not on file  . Intimate partner violence    Fear of current or ex partner: Not on file    Emotionally abused:  Not on file    Physically abused: Not on file    Forced sexual activity: Not on file  Other Topics Concern  . Not on file  Social History Narrative  . Not on file    ALLERGIES:    Allergies  Allergen Reactions  . Zegerid [Omeprazole-Sodium Bicarbonate]     Irregular Heart Beat    CURRENT MEDICATIONS:    Current Outpatient Medications  Medication Sig Dispense Refill  . aspirin EC 81 MG EC tablet Take 1 tablet (81 mg total) by mouth daily. 30 tablet 3  . atorvastatin (LIPITOR) 80 MG tablet Take 1 tablet (80 mg total) by mouth daily at 6 PM. 90 tablet 3  . clopidogrel (PLAVIX) 75 MG tablet Take 300 mg ( 4 tablets) the first day, then take 75 mg ( 1 tablet) daily thereafter 94 tablet 3  . cyanocobalamin (,VITAMIN B-12,) 1000 MCG/ML injection Inject 1 mL into the muscle every 30 (thirty) days.    Marland Kitchen LORazepam (ATIVAN) 1 MG tablet Take 1 mg by mouth 2 (two) times daily as needed for anxiety.     . metoprolol  tartrate (LOPRESSOR) 25 MG tablet Take 0.5 tablets (12.5 mg total) by mouth 2 (two) times daily. 60 tablet 0  . NALTREXONE HCL PO Take 4.5 mg by mouth every morning.    . pantoprazole (PROTONIX) 40 MG tablet Take 1 tablet (40 mg total) by mouth daily. 30 tablet 1  . PROAIR HFA 108 (90 Base) MCG/ACT inhaler Inhale 1 puff into the lungs every 6 (six) hours as needed for wheezing or shortness of breath.     . SYMBICORT 160-4.5 MCG/ACT inhaler Inhale 1 puff into the lungs daily as needed (shortness of breath and wheezing).     . Thyroid (WP THYROID) 113.75 MG TABS Take 1 tab daily in AM. **PT NEEDS LAB APPT** 45 tablet 1   No current facility-administered medications for this visit.     REVIEW OF SYSTEMS:   [X]  denotes positive finding, [ ]  denotes negative finding Cardiac  Comments:  Chest pain or chest pressure:    Shortness of breath upon exertion:    Short of breath when lying flat:    Irregular heart rhythm:        Vascular    Pain in calf, thigh, or hip brought on by ambulation:    Pain in feet at night that wakes you up from your sleep:     Blood clot in your veins:    Leg swelling:         Pulmonary    Oxygen at home:    Productive cough:     Wheezing:         Neurologic    Sudden weakness in arms or legs:     Sudden numbness in arms or legs:     Sudden onset of difficulty speaking or slurred speech:    Temporary loss of vision in one eye:     Problems with dizziness:         Gastrointestinal    Blood in stool:      Vomited blood:         Genitourinary    Burning when urinating:     Blood in urine:        Psychiatric    Major depression:         Hematologic    Bleeding problems:    Problems with blood clotting too easily:        Skin  Rashes or ulcers:        Constitutional    Fever or chills:     PHYSICAL EXAM:   Vitals:   07/22/19 1503 07/22/19 1506  BP: 120/75 137/67  Pulse: 81   Resp: 20   Temp: (!) 97.4 F (36.3 C)   SpO2: 96%   Weight:  81.6 kg   Height: 5' 7.5" (1.715 m)     GENERAL: The patient is a well-nourished female, in no acute distress. The vital signs are documented above. CARDIAC: There is a regular rate and rhythm.  PULMONARY: Nonlabored respirations ABDOMEN: Soft and non-tender with normal pitched bowel sounds.  MUSCULOSKELETAL: There are no major deformities or cyanosis. NEUROLOGIC: No focal weakness or paresthesias are detected. SKIN: There are no ulcers or rashes noted. PSYCHIATRIC: The patient has a normal affect.  STUDIES:   I have reviewed the following :  Right Carotid: Velocities in the right ICA are consistent with a 80-99%                stenosis. Non-hemodynamically significant plaque <50% noted in                the CCA. The ECA appears >50% stenosed.  Left Carotid: Velocities in the left ICA are consistent with a 40-59% stenosis.               Non-hemodynamically significant plaque <50% noted in the CCA. The               ECA appears >50% stenosed.  Vertebrals:  Bilateral vertebral arteries demonstrate antegrade flow. Subclavians: Normal flow hemodynamics were seen in bilateral subclavian              arteries.  ASSESSMENT and PLAN   Asymptomatic right carotid stenosis: I discussed options of treatment which would be surgical endarterectomy versus TCAR.  Because of her recent NSTEMI and need for dual antiplatelet therapy, I would preferentially proceed with TCAR.  Therefore, I am awaiting a CT angiogram of her head and neck to see if she is a candidate.  If not, I think we could safely perform endarterectomy.  We will go over in greater detail the risks and benefits of the procedure when she returns next week.   Leia Alf, MD, FACS [facility] [phone] Pager [phone]

## 2019-07-23 ENCOUNTER — Other Ambulatory Visit: Payer: Self-pay | Admitting: Surgery

## 2019-07-23 DIAGNOSIS — I6523 Occlusion and stenosis of bilateral carotid arteries: Secondary | ICD-10-CM

## 2019-07-24 ENCOUNTER — Other Ambulatory Visit: Payer: Self-pay | Admitting: Surgery

## 2019-07-24 DIAGNOSIS — I6523 Occlusion and stenosis of bilateral carotid arteries: Secondary | ICD-10-CM

## 2019-07-25 ENCOUNTER — Ambulatory Visit (HOSPITAL_COMMUNITY)
Admission: RE | Admit: 2019-07-25 | Discharge: 2019-07-25 | Disposition: A | Discharge status: Home / Self Care | Payer: Medicare Other | Source: Ambulatory Visit | Attending: Surgery | Admitting: Surgery

## 2019-07-25 ENCOUNTER — Encounter (HOSPITAL_COMMUNITY): Payer: Self-pay

## 2019-07-25 DIAGNOSIS — I6523 Occlusion and stenosis of bilateral carotid arteries: Secondary | ICD-10-CM | POA: Diagnosis not present

## 2019-07-25 LAB — POCT I-STAT CREATININE: Creatinine, Ser: 0.7 mg/dL (ref 0.44–1.00)

## 2019-07-25 MED ORDER — IOHEXOL 350 MG/ML SOLN
100.0000 mL | Freq: Once | INTRAVENOUS | Status: AC | PRN
  Administered 2019-07-25: 100 mL via INTRAVENOUS

## 2019-07-25 MED ORDER — SODIUM CHLORIDE (PF) 0.9 % IJ SOLN
INTRAMUSCULAR | Status: AC
  Filled 2019-07-25: qty 50

## 2019-07-26 ENCOUNTER — Encounter (HOSPITAL_COMMUNITY): Payer: Medicare Other

## 2019-07-29 ENCOUNTER — Other Ambulatory Visit: Payer: Self-pay | Admitting: *Deleted

## 2019-07-29 ENCOUNTER — Encounter: Payer: Self-pay | Admitting: *Deleted

## 2019-07-29 ENCOUNTER — Ambulatory Visit (INDEPENDENT_AMBULATORY_CARE_PROVIDER_SITE_OTHER): Payer: Medicare Other | Admitting: Surgery

## 2019-07-29 ENCOUNTER — Encounter: Payer: Self-pay | Admitting: Surgery

## 2019-07-29 ENCOUNTER — Other Ambulatory Visit: Payer: Self-pay

## 2019-07-29 VITALS — BP 122/66 | HR 79 | Temp 97.3°F | Resp 20 | Ht 67.5 in | Wt 108.3 lb

## 2019-07-29 DIAGNOSIS — I6521 Occlusion and stenosis of right carotid artery: Secondary | ICD-10-CM

## 2019-07-29 NOTE — Progress Notes (Signed)
 Vascular and Vein Specialist of [place]  Patient name: Tabitha Schmidt MRN: [id number] DOB: 02/14/1956 Sex: female   REASON FOR VISIT:    Follow up carotid  HISOTRY OF PRESENT ILLNESS:    Tabitha Schmidt is back today to discuss the results of her CTA.   Her carotid stenosis  was initially detected at  Hospital over the summer.  She ended up having a NSTEMI and was treated with DES.  She is not having any neurologic symptoms.  Specifically she denies numbness or weakness in either extremity.  She denies slurred speech.  She denies amaurosis fugax.  She was started on Brilinta but did not tolerate it and so she was switched to aspirin and Plavix.  The patient does take a statin for hypercholesterolemia.  She has been diagnosed with diabetes but is not on medications.  She is medically managed for hypertension.  She is a current smoker.   PAST MEDICAL HISTORY:   Past Medical History:  Diagnosis Date  . Anxiety   . Cataracts, bilateral    Shapiro  . COPD (chronic obstructive pulmonary disease) (HCC)   . Coronary artery disease    a. s/p DES to mid-RCA in 05/2019  . DM (diabetes mellitus) (HCC)    "came off medicines with dr's permission"  . GERD (gastroesophageal reflux disease)   . Hyperlipidemia    stopped meds on her own  . Hypothyroid   . Shortness of breath      FAMILY HISTORY:   Family History  Problem Relation Age of Onset  . CAD Father   . Anesthesia problems Neg Hx   . Hypotension Neg Hx   . Malignant hyperthermia Neg Hx   . Pseudochol deficiency Neg Hx     SOCIAL HISTORY:   Social History   Tobacco Use  . Smoking status: Current Every Day Smoker    Packs/day: 0.50    Years: 40.00    Pack years: 20.00    Types: Cigarettes  . Smokeless tobacco: Never Used  Substance Use Topics  . Alcohol use: No     ALLERGIES:   Allergies  Allergen Reactions  . Zegerid [Omeprazole-Sodium Bicarbonate]     Irregular  Heart Beat     CURRENT MEDICATIONS:   Current Outpatient Medications  Medication Sig Dispense Refill  . aspirin EC 81 MG EC tablet Take 1 tablet (81 mg total) by mouth daily. 30 tablet 3  . atorvastatin (LIPITOR) 80 MG tablet Take 1 tablet (80 mg total) by mouth daily at 6 PM. 90 tablet 3  . clopidogrel (PLAVIX) 75 MG tablet Take 300 mg ( 4 tablets) the first day, then take 75 mg ( 1 tablet) daily thereafter 94 tablet 3  . cyanocobalamin (,VITAMIN B-12,) 1000 MCG/ML injection Inject 1 mL into the muscle every 30 (thirty) days.    . LORazepam (ATIVAN) 1 MG tablet Take 1 mg by mouth 2 (two) times daily as needed for anxiety.     . metoprolol tartrate (LOPRESSOR) 25 MG tablet Take 0.5 tablets (12.5 mg total) by mouth 2 (two) times daily. 60 tablet 0  . NALTREXONE HCL PO Take 4.5 mg by mouth every morning.    . pantoprazole (PROTONIX) 40 MG tablet Take 1 tablet (40 mg total) by mouth daily. 30 tablet 1  . PROAIR HFA 108 (90 Base) MCG/ACT inhaler Inhale 1 puff into the lungs every 6 (six) hours as needed for wheezing or shortness of breath.     . SYMBICORT   160-4.5 MCG/ACT inhaler Inhale 1 puff into the lungs daily as needed (shortness of breath and wheezing).     . Thyroid (WP THYROID) 113.75 MG TABS Take 1 tab daily in AM. **PT NEEDS LAB APPT** 45 tablet 1   No current facility-administered medications for this visit.     REVIEW OF SYSTEMS:   [X]  denotes positive finding, [ ]  denotes negative finding Cardiac  Comments:  Chest pain or chest pressure:    Shortness of breath upon exertion:    Short of breath when lying flat:    Irregular heart rhythm:        Vascular    Pain in calf, thigh, or hip brought on by ambulation:    Pain in feet at night that wakes you up from your sleep:     Blood clot in your veins:    Leg swelling:         Pulmonary    Oxygen at home:    Productive cough:     Wheezing:         Neurologic    Sudden weakness in arms or legs:     Sudden numbness in  arms or legs:     Sudden onset of difficulty speaking or slurred speech:    Temporary loss of vision in one eye:     Problems with dizziness:         Gastrointestinal    Blood in stool:     Vomited blood:         Genitourinary    Burning when urinating:     Blood in urine:        Psychiatric    Major depression:         Hematologic    Bleeding problems:    Problems with blood clotting too easily:        Skin    Rashes or ulcers:        Constitutional    Fever or chills:      PHYSICAL EXAM:   Vitals:   07/29/19 1210  BP: 122/66  Pulse: 79  Resp: 20  Temp: (!) 97.3 F (36.3 C)  SpO2: 97%  Weight: 108 lb 4.8 oz (49.1 kg)  Height: 5' 7.5" (1.715 m)    GENERAL: The patient is a well-nourished female, in no acute distress. The vital signs are documented above. CARDIAC: There is a regular rate and rhythm.  PULMONARY: Non-labored respirations MUSCULOSKELETAL: There are no major deformities or cyanosis. NEUROLOGIC: No focal weakness or paresthesias are detected. SKIN: There are no ulcers or rashes noted. PSYCHIATRIC: The patient has a normal affect.  STUDIES:   I have reviewed her CTA with the following findings: IMPRESSION: 1. 85% right and 50% left ICA origin stenoses. 2. Severe left ECA origin stenosis. 3. Widely patent vertebral arteries. 4. Mild intracranial atherosclerosis without major branch occlusion or significant proximal stenosis. 5. Unremarkable CT appearance of the brain for age. 6. Aortic Atherosclerosis (ICD10-I70.0) and Emphysema (ICD10-J43.9).    MEDICAL ISSUES:   Asymptomatic right carotid endarterectomy: The patient is a candidate for TCAR on the right side.  I discussed in detail the steps of the procedure.  We discussed the 0.6% risk of stroke, the risk of bleeding and cardiac issues.  I think she is best treated with TCAR given her recent cardiac issues.  She has been stay on aspirin Plavix and statin.  Her procedure scheduled for this  Friday, October 2    Annamarie Major, Dorothy Puffer, MD,  FACS [facility] [phone] Pager [phone]

## 2019-07-29 NOTE — H&P (View-Only) (Signed)
Vascular and Vein Specialist of [facility]  Patient name: Tabitha Schmidt MRN: [id number] DOB: Jan 31, 1956 Sex: female   REASON FOR VISIT:    Follow up carotid  HISOTRY OF PRESENT ILLNESS:    Tabitha Schmidt is back today to discuss the results of her CTA.   Her carotid stenosis  was initially detected at [facility] over the summer.  She ended up having a NSTEMI and was treated with DES.  She is not having any neurologic symptoms.  Specifically she denies numbness or weakness in either extremity.  She denies slurred speech.  She denies amaurosis fugax.  She was started on Brilinta but did not tolerate it and so she was switched to aspirin and Plavix.  The patient does take a statin for hypercholesterolemia.  She has been diagnosed with diabetes but is not on medications.  She is medically managed for hypertension.  She is a current smoker.   PAST MEDICAL HISTORY:   Past Medical History:  Diagnosis Date  . Anxiety   . Cataracts, bilateral    [name]  . COPD (chronic obstructive pulmonary disease) (HCC)   . Coronary artery disease    a. s/p DES to mid-RCA in 05/2019  . DM (diabetes mellitus) (HCC)    "came off medicines with dr's permission"  . GERD (gastroesophageal reflux disease)   . Hyperlipidemia    stopped meds on her own  . Hypothyroid   . Shortness of breath      FAMILY HISTORY:   Family History  Problem Relation Age of Onset  . CAD Father   . Anesthesia problems Neg Hx   . Hypotension Neg Hx   . Malignant hyperthermia Neg Hx   . Pseudochol deficiency Neg Hx     SOCIAL HISTORY:   Social History   Tobacco Use  . Smoking status: Current Every Day Smoker    Packs/day: 0.50    Years: 40.00    Pack years: 20.00    Types: Cigarettes  . Smokeless tobacco: Never Used  Substance Use Topics  . Alcohol use: No     ALLERGIES:   Allergies  Allergen Reactions  . Zegerid [Omeprazole-Sodium Bicarbonate]     Irregular  Heart Beat     CURRENT MEDICATIONS:   Current Outpatient Medications  Medication Sig Dispense Refill  . aspirin EC 81 MG EC tablet Take 1 tablet (81 mg total) by mouth daily. 30 tablet 3  . atorvastatin (LIPITOR) 80 MG tablet Take 1 tablet (80 mg total) by mouth daily at 6 PM. 90 tablet 3  . clopidogrel (PLAVIX) 75 MG tablet Take 300 mg ( 4 tablets) the first day, then take 75 mg ( 1 tablet) daily thereafter 94 tablet 3  . cyanocobalamin (,VITAMIN B-12,) 1000 MCG/ML injection Inject 1 mL into the muscle every 30 (thirty) days.    Marland Kitchen LORazepam (ATIVAN) 1 MG tablet Take 1 mg by mouth 2 (two) times daily as needed for anxiety.     . metoprolol tartrate (LOPRESSOR) 25 MG tablet Take 0.5 tablets (12.5 mg total) by mouth 2 (two) times daily. 60 tablet 0  . NALTREXONE HCL PO Take 4.5 mg by mouth every morning.    . pantoprazole (PROTONIX) 40 MG tablet Take 1 tablet (40 mg total) by mouth daily. 30 tablet 1  . PROAIR HFA 108 (90 Base) MCG/ACT inhaler Inhale 1 puff into the lungs every 6 (six) hours as needed for wheezing or shortness of breath.     [place]  160-4.5 MCG/ACT inhaler Inhale 1 puff into the lungs daily as needed (shortness of breath and wheezing).     . Thyroid (WP THYROID) 113.75 MG TABS Take 1 tab daily in AM. **PT NEEDS LAB APPT** 45 tablet 1   No current facility-administered medications for this visit.     REVIEW OF SYSTEMS:   [X]  denotes positive finding, [ ]  denotes negative finding Cardiac  Comments:  Chest pain or chest pressure:    Shortness of breath upon exertion:    Short of breath when lying flat:    Irregular heart rhythm:        Vascular    Pain in calf, thigh, or hip brought on by ambulation:    Pain in feet at night that wakes you up from your sleep:     Blood clot in your veins:    Leg swelling:         Pulmonary    Oxygen at home:    Productive cough:     Wheezing:         Neurologic    Sudden weakness in arms or legs:     Sudden numbness in  arms or legs:     Sudden onset of difficulty speaking or slurred speech:    Temporary loss of vision in one eye:     Problems with dizziness:         Gastrointestinal    Blood in stool:     Vomited blood:         Genitourinary    Burning when urinating:     Blood in urine:        Psychiatric    Major depression:         Hematologic    Bleeding problems:    Problems with blood clotting too easily:        Skin    Rashes or ulcers:        Constitutional    Fever or chills:      PHYSICAL EXAM:   Vitals:   07/29/19 1210  BP: 122/66  Pulse: 79  Resp: 20  Temp: (!) 97.3 F (36.3 C)  SpO2: 97%  Weight: 108 lb 4.8 oz (49.1 kg)  Height: 5' 7.5" (1.715 m)    GENERAL: The patient is a well-nourished female, in no acute distress. The vital signs are documented above. CARDIAC: There is a regular rate and rhythm.  PULMONARY: Non-labored respirations MUSCULOSKELETAL: There are no major deformities or cyanosis. NEUROLOGIC: No focal weakness or paresthesias are detected. SKIN: There are no ulcers or rashes noted. PSYCHIATRIC: The patient has a normal affect.  STUDIES:   I have reviewed her CTA with the following findings: IMPRESSION: 1. 85% right and 50% left ICA origin stenoses. 2. Severe left ECA origin stenosis. 3. Widely patent vertebral arteries. 4. Mild intracranial atherosclerosis without major branch occlusion or significant proximal stenosis. 5. Unremarkable CT appearance of the brain for age. 6. Aortic Atherosclerosis (ICD10-I70.0) and Emphysema (ICD10-J43.9).    MEDICAL ISSUES:   Asymptomatic right carotid endarterectomy: The patient is a candidate for TCAR on the right side.  I discussed in detail the steps of the procedure.  We discussed the 0.6% risk of stroke, the risk of bleeding and cardiac issues.  I think she is best treated with TCAR given her recent cardiac issues.  She has been stay on aspirin Plavix and statin.  Her procedure scheduled for this  Friday, October 2    Annamarie Major, Dorothy Puffer, MD,  FACS [facility] [phone] Pager [phone]

## 2019-07-29 NOTE — Progress Notes (Signed)
Call and instructed patient to be at [facility] admitting at 7:30 am or as directed by hospital preadmission department.

## 2019-08-01 ENCOUNTER — Other Ambulatory Visit (HOSPITAL_COMMUNITY)
Admission: RE | Admit: 2019-08-01 | Discharge: 2019-08-01 | Disposition: A | Discharge status: Home / Self Care | Payer: Medicare Other | Source: Ambulatory Visit | Attending: Surgery | Admitting: Surgery

## 2019-08-01 ENCOUNTER — Other Ambulatory Visit: Payer: Self-pay

## 2019-08-01 ENCOUNTER — Encounter (HOSPITAL_COMMUNITY)
Admission: RE | Admit: 2019-08-01 | Discharge: 2019-08-01 | Disposition: A | Discharge status: Home / Self Care | Payer: Medicare Other | Source: Ambulatory Visit | Attending: Surgery | Admitting: Surgery

## 2019-08-01 ENCOUNTER — Encounter (HOSPITAL_COMMUNITY): Payer: Self-pay

## 2019-08-01 HISTORY — DX: Unspecified osteoarthritis, unspecified site: M19.90

## 2019-08-01 HISTORY — DX: Dependence on supplemental oxygen: Z99.81

## 2019-08-01 HISTORY — DX: Unspecified asthma, uncomplicated: J45.909

## 2019-08-01 HISTORY — DX: Occlusion and stenosis of right carotid artery: I65.21

## 2019-08-01 HISTORY — DX: Dislocation of jaw, unspecified side, initial encounter: S03.00XA

## 2019-08-01 HISTORY — DX: Fatty (change of) liver, not elsewhere classified: K76.0

## 2019-08-01 HISTORY — DX: Family history of other specified conditions: Z84.89

## 2019-08-01 HISTORY — DX: Acute myocardial infarction, unspecified: I21.9

## 2019-08-01 HISTORY — DX: Pneumonia, unspecified organism: J18.9

## 2019-08-01 HISTORY — DX: Fibromyalgia: M79.7

## 2019-08-01 HISTORY — DX: Other complications of anesthesia, initial encounter: T88.59XA

## 2019-08-01 LAB — COMPREHENSIVE METABOLIC PANEL
ALT: 26 U/L (ref 0–44)
AST: 22 U/L (ref 15–41)
Albumin: 4 g/dL (ref 3.5–5.0)
Alkaline Phosphatase: 101 U/L (ref 38–126)
Anion gap: 9 (ref 5–15)
BUN: 9 mg/dL (ref 8–23)
CO2: 24 mmol/L (ref 22–32)
Calcium: 9.4 mg/dL (ref 8.9–10.3)
Chloride: 108 mmol/L (ref 98–111)
Creatinine, Ser: 0.63 mg/dL (ref 0.44–1.00)
GFR calc Af Amer: >60 mL/min (ref >60)
GFR calc non Af Amer: >60 mL/min (ref >60)
Glucose, Bld: 91 mg/dL (ref 70–99)
Potassium: 4.2 mmol/L (ref 3.5–5.1)
Sodium: 141 mmol/L (ref 135–145)
Total Bilirubin: 0.6 mg/dL (ref 0.3–1.2)
Total Protein: 7.2 g/dL (ref 6.5–8.1)

## 2019-08-01 LAB — URINALYSIS, ROUTINE W REFLEX MICROSCOPIC
Bilirubin Urine: NEGATIVE
Glucose, UA: NEGATIVE mg/dL
Hgb urine dipstick: NEGATIVE
Ketones, ur: NEGATIVE mg/dL
Leukocytes,Ua: NEGATIVE
Nitrite: NEGATIVE
Protein, ur: NEGATIVE mg/dL
Specific Gravity, Urine: 1.005 (ref 1.005–1.030)
pH: 6 (ref 5.0–8.0)

## 2019-08-01 LAB — PROTIME-INR
INR: 1 (ref 0.8–1.2)
Prothrombin Time: 13.4 seconds (ref 11.4–15.2)

## 2019-08-01 LAB — CBC
HCT: 44.1 % (ref 36.0–46.0)
Hemoglobin: 14.5 g/dL (ref 12.0–15.0)
MCH: 30.9 pg (ref 26.0–34.0)
MCHC: 32.9 g/dL (ref 30.0–36.0)
MCV: 93.8 fL (ref 80.0–100.0)
Platelets: 284 10*3/uL (ref 150–400)
RBC: 4.7 MIL/uL (ref 3.87–5.11)
RDW: 12.3 % (ref 11.5–15.5)
WBC: 6.3 10*3/uL (ref 4.0–10.5)
nRBC: 0 % (ref 0.0–0.2)

## 2019-08-01 LAB — SURGICAL PCR SCREEN
MRSA, PCR: NEGATIVE
Staphylococcus aureus: NEGATIVE

## 2019-08-01 LAB — TYPE AND SCREEN
ABO/RH(D): A POS
Antibody Screen: NEGATIVE

## 2019-08-01 LAB — APTT: aPTT: 32 seconds (ref 24–36)

## 2019-08-01 LAB — HEMOGLOBIN A1C
Hgb A1c MFr Bld: 5.1 % (ref 4.8–5.6)
Mean Plasma Glucose: 99.67 mg/dL

## 2019-08-01 LAB — ABO/RH: ABO/RH(D): A POS

## 2019-08-01 LAB — GLUCOSE, CAPILLARY: Glucose-Capillary: 94 mg/dL (ref 70–99)

## 2019-08-01 LAB — SARS CORONAVIRUS 2 (TAT 6-24 HRS): SARS Coronavirus 2: NEGATIVE

## 2019-08-01 NOTE — Pre-Procedure Instructions (Signed)
Tabitha Schmidt  08/01/2019     [facility] - [place], [place] [address] Phone: [phone] Fax: [phone]   Your procedure is scheduled on Friday, August 02, 2019  Report to [facility] Admitting at 7:55 A.M.  Call this number if you have problems the morning of surgery:  [phone]   Remember: Brush your teeth the morning of surgery with your regular toothpaste.   Do not eat or drink after midnight.    Take these medicines the morning of surgery with A SIP OF WATER : aspirin,  metoprolol tartrate (LOPRESSOR), Thyroid, clopidogrel (PLAVIX), atorvastatin (LIPITOR) If needed: LORazepam (ATIVAN) for anxiety   If needed : SYMBICORT or PROAIR inhaler for wheezing or shortness of breath.  ( bring inhalers in with you ON DAY OF SURGERY )  Stop taking vitamins, fish oil, NALTREXONE and herbal medications. Do not take any NSAIDs ie: Ibuprofen, Advil, Naproxen (Aleve), Motrin, BC and Goody Powder.     How to Manage Your Diabetes Before and After Surgery  Why is it important to control my blood sugar before and after surgery? . Improving blood sugar levels before and after surgery helps healing and can limit problems. . A way of improving blood sugar control is eating a healthy diet by: o  Eating less sugar and carbohydrates o  Increasing activity/exercise o  Talking with your doctor about reaching your blood sugar goals . High blood sugars (greater than 180 mg/dL) can raise your risk of infections and slow your recovery, so you will need to focus on controlling your diabetes during the weeks before surgery. . Make sure that the doctor who takes care of your diabetes knows about your planned surgery including the date and location.  How do I manage my blood sugar before surgery? . Check your blood sugar at least 4 times a day, starting 2 days before surgery, to make sure that the level is not too high or low. o Check  your blood sugar the morning of your surgery when you wake up and every 2 hours until you get to the Short Stay unit. . If your blood sugar is less than 70 mg/dL, you will need to treat for low blood sugar: o Treat a low blood sugar (less than 70 mg/dL) with  cup of clear juice (cranberry or apple), 4 glucose tablets, OR glucose gel. Recheck blood sugar in 15 minutes after treatment (to make sure it is greater than 70 mg/dL). If your blood sugar is not greater than 70 mg/dL on recheck, call [phone] o  for further instructions. . Report your blood sugar to the short stay nurse when you get to Short Stay.  . If you are admitted to the hospital after surgery: o Your blood sugar will be checked by the staff and you will probably be given insulin after surgery (instead of oral diabetes medicines) to make sure you have good blood sugar levels. o The goal for blood sugar control after surgery is 80-180 mg/dL.  WHAT DO I DO ABOUT MY DIABETES MEDICATION? N/A   . Do not take oral diabetes medicines (pills) the morning of surgery.  Reviewed and Endorsed by [facility] Patient Education Committee, August 2015   Do not wear jewelry, make-up or nail polish.  Do not wear lotions, powders, or perfumes, or deodorant.  Do not shave 48 hours prior to surgery.  Do not bring valuables to the hospital.  [facility] is not responsible for any belongings or valuables.  Contacts, dentures or bridgework may not be worn into surgery.  Leave your suitcase in the car.  After surgery it may be brought to your room.  Special instructions: See " [facility] Preparing For Surgery " sheet.  Please read over the following fact sheets that you were given. Pain Booklet, Coughing and Deep Breathing, MRSA Information and Surgical Site Infection Prevention

## 2019-08-01 NOTE — Progress Notes (Signed)
Pt denies any acute cardiopulmonary issues. Pt stated that she is under the care of Barbarann Ehlers, [place], Cardiology and Dr.  Sinda Du, PCP. Pt denies having recent labs. Pt verbalized understanding of all pre-op instructions. PA, Anesthesiology,  asked to review pt history.

## 2019-08-01 NOTE — Anesthesia Preprocedure Evaluation (Addendum)
Anesthesia Evaluation  Patient identified by MRN, date of birth, ID band Patient awake    Reviewed: Allergy & Precautions, NPO status , Patient's Chart, lab work & pertinent test results, reviewed documented beta blocker date and time   Airway Mallampati: II  TM Distance: >3 FB Neck ROM: Full    Dental  (+) Teeth Intact, Dental Advisory Given   Pulmonary shortness of breath, asthma , COPD,  COPD inhaler and oxygen dependent, Current SmokerPatient did not abstain from smoking.,    Pulmonary exam normal breath sounds clear to auscultation       Cardiovascular hypertension, Pt. on home beta blockers and Pt. on medications + CAD, + Past MI and + Cardiac Stents  Normal cardiovascular exam Rhythm:Regular Rate:Normal     Neuro/Psych PSYCHIATRIC DISORDERS Anxiety negative neurological ROS     GI/Hepatic Neg liver ROS, GERD  Medicated,  Endo/Other  diabetes, Type 2Hypothyroidism   Renal/GU negative Renal ROS     Musculoskeletal  (+) Arthritis , Fibromyalgia -  Abdominal   Peds  Hematology  (+) Blood dyscrasia (Plavix), ,   Anesthesia Other Findings Day of surgery medications reviewed with the patient.  Reproductive/Obstetrics                           Anesthesia Physical Anesthesia Plan  ASA: III  Anesthesia Plan: General   Post-op Pain Management:    Induction: Intravenous  PONV Risk Score and Plan: 2 and Dexamethasone and Ondansetron  Airway Management Planned: Oral ETT  Additional Equipment: Arterial line  Intra-op Plan:   Post-operative Plan: Extubation in OR  Informed Consent: I have reviewed the patients History and Physical, chart, labs and discussed the procedure including the risks, benefits and alternatives for the proposed anesthesia with the patient or authorized representative who has indicated his/her understanding and acceptance.     Dental advisory given  Plan  Discussed with: CRNA  Anesthesia Plan Comments: (Recent NSTEMI 05/17/2019, Cath showed 100% mid-RCA stenosis which was treated with DES placement. Seen in followup by cardiology 06/05/19 and was doing well. She did c/o some worsening of her chronic dyspnea and her Brilinta was subsequently changed to Plavix. Pt was also referred to [facility] for eval of carotid stenosis identified on recent dopplers. Further imaging revealed 85% R ICA stenosis and she was recommended to undergo TCAR. Per [facility] she is to continue plavix, ASA, and statin on morning of surgery.  Cath and PCI 05/18/19:  Mid RCA lesion is 100% stenosed.  A drug-eluting stent was successfully placed.  Post intervention, there is a 0% residual stenosis.  There is mild left ventricular systolic dysfunction.  LV end diastolic pressure is mildly elevated.  The left ventricular ejection fraction is 50-55% by visual estimate.   TTE 05/13/19:  1. The left ventricle has normal systolic function with an ejection fraction of 60-65%. The cavity size was normal. There is mild concentric left ventricular hypertrophy. Left ventricular diastolic Doppler parameters are consistent with impaired  relaxation. No evidence of left ventricular regional wall motion abnormalities.  2. The right ventricle has normal systolic function. The cavity was normal. There is no increase in right ventricular wall thickness.  3. The mitral valve is grossly normal. Mild thickening of the mitral valve leaflet.  4. The tricuspid valve is grossly normal.  5. The aortic valve is tricuspid.  6. The aortic root is normal in size and structure.     )  Anesthesia Quick Evaluation  

## 2019-08-02 ENCOUNTER — Inpatient Hospital Stay (HOSPITAL_COMMUNITY): Payer: Medicare Other | Admitting: Physician Assistant

## 2019-08-02 ENCOUNTER — Inpatient Hospital Stay (HOSPITAL_COMMUNITY)
Admission: RE | Admit: 2019-08-02 | Discharge: 2019-08-03 | DRG: 036 | Disposition: A | Discharge status: Home / Self Care | Payer: Medicare Other | Attending: Surgery | Admitting: Surgery

## 2019-08-02 ENCOUNTER — Inpatient Hospital Stay (HOSPITAL_COMMUNITY): Payer: Medicare Other | Admitting: Anesthesiology

## 2019-08-02 ENCOUNTER — Encounter (HOSPITAL_COMMUNITY): Payer: Self-pay | Admitting: *Deleted

## 2019-08-02 ENCOUNTER — Encounter (HOSPITAL_COMMUNITY)
Admission: RE | Disposition: A | Discharge status: Home / Self Care | Payer: Self-pay | Source: Home / Self Care | Attending: Surgery

## 2019-08-02 ENCOUNTER — Inpatient Hospital Stay (HOSPITAL_COMMUNITY): Payer: Medicare Other

## 2019-08-02 DIAGNOSIS — E7800 Pure hypercholesterolemia, unspecified: Secondary | ICD-10-CM | POA: Diagnosis present

## 2019-08-02 DIAGNOSIS — M797 Fibromyalgia: Secondary | ICD-10-CM | POA: Diagnosis present

## 2019-08-02 DIAGNOSIS — I672 Cerebral atherosclerosis: Secondary | ICD-10-CM | POA: Diagnosis present

## 2019-08-02 DIAGNOSIS — I10 Essential (primary) hypertension: Secondary | ICD-10-CM | POA: Diagnosis present

## 2019-08-02 DIAGNOSIS — Z20828 Contact with and (suspected) exposure to other viral communicable diseases: Secondary | ICD-10-CM | POA: Diagnosis present

## 2019-08-02 DIAGNOSIS — R001 Bradycardia, unspecified: Secondary | ICD-10-CM | POA: Diagnosis not present

## 2019-08-02 DIAGNOSIS — I2510 Atherosclerotic heart disease of native coronary artery without angina pectoris: Secondary | ICD-10-CM | POA: Diagnosis present

## 2019-08-02 DIAGNOSIS — Z7902 Long term (current) use of antithrombotics/antiplatelets: Secondary | ICD-10-CM | POA: Diagnosis not present

## 2019-08-02 DIAGNOSIS — Z79890 Hormone replacement therapy (postmenopausal): Secondary | ICD-10-CM | POA: Diagnosis not present

## 2019-08-02 DIAGNOSIS — E119 Type 2 diabetes mellitus without complications: Secondary | ICD-10-CM | POA: Diagnosis present

## 2019-08-02 DIAGNOSIS — I700 Atherosclerosis of aorta: Secondary | ICD-10-CM | POA: Diagnosis present

## 2019-08-02 DIAGNOSIS — I252 Old myocardial infarction: Secondary | ICD-10-CM

## 2019-08-02 DIAGNOSIS — D759 Disease of blood and blood-forming organs, unspecified: Secondary | ICD-10-CM | POA: Diagnosis present

## 2019-08-02 DIAGNOSIS — Z7982 Long term (current) use of aspirin: Secondary | ICD-10-CM | POA: Diagnosis not present

## 2019-08-02 DIAGNOSIS — I6521 Occlusion and stenosis of right carotid artery: Principal | ICD-10-CM | POA: Diagnosis present

## 2019-08-02 DIAGNOSIS — Z79899 Other long term (current) drug therapy: Secondary | ICD-10-CM

## 2019-08-02 DIAGNOSIS — F17210 Nicotine dependence, cigarettes, uncomplicated: Secondary | ICD-10-CM | POA: Diagnosis present

## 2019-08-02 DIAGNOSIS — E039 Hypothyroidism, unspecified: Secondary | ICD-10-CM | POA: Diagnosis present

## 2019-08-02 DIAGNOSIS — K219 Gastro-esophageal reflux disease without esophagitis: Secondary | ICD-10-CM | POA: Diagnosis present

## 2019-08-02 DIAGNOSIS — M1990 Unspecified osteoarthritis, unspecified site: Secondary | ICD-10-CM | POA: Diagnosis present

## 2019-08-02 DIAGNOSIS — Z7951 Long term (current) use of inhaled steroids: Secondary | ICD-10-CM

## 2019-08-02 DIAGNOSIS — Z955 Presence of coronary angioplasty implant and graft: Secondary | ICD-10-CM | POA: Diagnosis not present

## 2019-08-02 DIAGNOSIS — Z8249 Family history of ischemic heart disease and other diseases of the circulatory system: Secondary | ICD-10-CM

## 2019-08-02 DIAGNOSIS — I6529 Occlusion and stenosis of unspecified carotid artery: Secondary | ICD-10-CM | POA: Diagnosis present

## 2019-08-02 DIAGNOSIS — J439 Emphysema, unspecified: Secondary | ICD-10-CM | POA: Diagnosis present

## 2019-08-02 HISTORY — PX: TRANSCAROTID ARTERY REVASCULARIZATIONÂ: SHX6778

## 2019-08-02 LAB — GLUCOSE, CAPILLARY
Glucose-Capillary: 85 mg/dL (ref 70–99)
Glucose-Capillary: 107 mg/dL — ABNORMAL HIGH (ref 70–99)

## 2019-08-02 SURGERY — TRANSCAROTID ARTERY REVASCULARIZATION (TCAR)
Anesthesia: General | Laterality: Right

## 2019-08-02 MED ORDER — MORPHINE SULFATE (PF) 2 MG/ML IV SOLN: 2.0000 mg | INTRAVENOUS | Status: DC | PRN

## 2019-08-02 MED ORDER — LORAZEPAM 1 MG PO TABS
1.0000 mg | ORAL_TABLET | Freq: Two times a day (BID) | ORAL | Status: DC | PRN
  Administered 2019-08-02: 22:00:00 1 mg via ORAL
  Filled 2019-08-02: qty 1

## 2019-08-02 MED ORDER — DOCUSATE SODIUM 100 MG PO CAPS
100.0000 mg | ORAL_CAPSULE | Freq: Every day | ORAL | Status: DC
  Administered 2019-08-03: 10:00:00 100 mg via ORAL
  Filled 2019-08-02: qty 1

## 2019-08-02 MED ORDER — SODIUM CHLORIDE 0.9 % IV SOLN
INTRAVENOUS | Status: DC | PRN
  Administered 2019-08-02: 25 ug/min via INTRAVENOUS

## 2019-08-02 MED ORDER — SODIUM CHLORIDE 0.9 % IV SOLN: INTRAVENOUS | Status: DC

## 2019-08-02 MED ORDER — PROPOFOL 10 MG/ML IV BOLUS
INTRAVENOUS | Status: AC
  Filled 2019-08-02: qty 20

## 2019-08-02 MED ORDER — GLYCOPYRROLATE 0.2 MG/ML IJ SOLN
INTRAMUSCULAR | Status: DC | PRN
  Administered 2019-08-02: 0.2 mg via INTRAVENOUS

## 2019-08-02 MED ORDER — LACTATED RINGERS IV SOLN
INTRAVENOUS | Status: DC
  Administered 2019-08-02: 08:00:00 via INTRAVENOUS

## 2019-08-02 MED ORDER — CHLORHEXIDINE GLUCONATE 4 % EX LIQD: 60.0000 mL | Freq: Once | CUTANEOUS | Status: DC

## 2019-08-02 MED ORDER — ALUM & MAG HYDROXIDE-SIMETH 200-200-20 MG/5ML PO SUSP: 15.0000 mL | ORAL | Status: DC | PRN

## 2019-08-02 MED ORDER — ASPIRIN EC 81 MG PO TBEC
81.0000 mg | DELAYED_RELEASE_TABLET | Freq: Every day | ORAL | Status: DC
  Administered 2019-08-03: 81 mg via ORAL
  Filled 2019-08-02 (×2): qty 1

## 2019-08-02 MED ORDER — OXYCODONE-ACETAMINOPHEN 5-325 MG PO TABS: 1.0000 | ORAL_TABLET | ORAL | Status: DC | PRN

## 2019-08-02 MED ORDER — MEPERIDINE HCL 25 MG/ML IJ SOLN
INTRAMUSCULAR | Status: AC
  Filled 2019-08-02: qty 1

## 2019-08-02 MED ORDER — PHENOL 1.4 % MT LIQD: 1.0000 | OROMUCOSAL | Status: DC | PRN

## 2019-08-02 MED ORDER — CLOPIDOGREL BISULFATE 75 MG PO TABS
75.0000 mg | ORAL_TABLET | Freq: Every day | ORAL | Status: DC
  Administered 2019-08-03: 10:00:00 75 mg via ORAL
  Filled 2019-08-02 (×2): qty 1

## 2019-08-02 MED ORDER — 0.9 % SODIUM CHLORIDE (POUR BTL) OPTIME
TOPICAL | Status: DC | PRN
  Administered 2019-08-02: 1000 mL

## 2019-08-02 MED ORDER — METOPROLOL TARTRATE 12.5 MG HALF TABLET
12.5000 mg | ORAL_TABLET | Freq: Two times a day (BID) | ORAL | Status: DC
  Administered 2019-08-03: 10:00:00 12.5 mg via ORAL
  Filled 2019-08-02 (×2): qty 1

## 2019-08-02 MED ORDER — ONDANSETRON HCL 4 MG/2ML IJ SOLN
INTRAMUSCULAR | Status: DC | PRN
  Administered 2019-08-02: 4 mg via INTRAVENOUS

## 2019-08-02 MED ORDER — ATORVASTATIN CALCIUM 80 MG PO TABS
80.0000 mg | ORAL_TABLET | Freq: Every day | ORAL | Status: DC
  Administered 2019-08-03: 80 mg via ORAL
  Filled 2019-08-02 (×2): qty 1

## 2019-08-02 MED ORDER — ACETAMINOPHEN 325 MG PO TABS
325.0000 mg | ORAL_TABLET | ORAL | Status: DC | PRN
  Administered 2019-08-02 – 2019-08-03 (×3): 650 mg via ORAL
  Filled 2019-08-02 (×3): qty 2

## 2019-08-02 MED ORDER — CEFAZOLIN SODIUM-DEXTROSE 2-4 GM/100ML-% IV SOLN
2.0000 g | INTRAVENOUS | Status: AC
  Administered 2019-08-02: 2 g via INTRAVENOUS
  Filled 2019-08-02: qty 100

## 2019-08-02 MED ORDER — LIDOCAINE HCL (PF) 1 % IJ SOLN
INTRAMUSCULAR | Status: AC
  Filled 2019-08-02: qty 30

## 2019-08-02 MED ORDER — FENTANYL CITRATE (PF) 250 MCG/5ML IJ SOLN
INTRAMUSCULAR | Status: AC
  Filled 2019-08-02: qty 5

## 2019-08-02 MED ORDER — ACETAMINOPHEN 325 MG RE SUPP: 325.0000 mg | RECTAL | Status: DC | PRN

## 2019-08-02 MED ORDER — SODIUM CHLORIDE 0.9 % IV SOLN: 500.0000 mL | Freq: Once | INTRAVENOUS | Status: DC | PRN

## 2019-08-02 MED ORDER — METOPROLOL TARTRATE 5 MG/5ML IV SOLN: 2.0000 mg | INTRAVENOUS | Status: DC | PRN

## 2019-08-02 MED ORDER — PHENYLEPHRINE 40 MCG/ML (10ML) SYRINGE FOR IV PUSH (FOR BLOOD PRESSURE SUPPORT)
PREFILLED_SYRINGE | INTRAVENOUS | Status: DC | PRN
  Administered 2019-08-02: 40 ug via INTRAVENOUS
  Administered 2019-08-02: 60 ug via INTRAVENOUS
  Administered 2019-08-02: 40 ug via INTRAVENOUS

## 2019-08-02 MED ORDER — PROPOFOL 10 MG/ML IV BOLUS
INTRAVENOUS | Status: DC | PRN
  Administered 2019-08-02: 180 mg via INTRAVENOUS

## 2019-08-02 MED ORDER — IODIXANOL 320 MG/ML IV SOLN
INTRAVENOUS | Status: DC | PRN
  Administered 2019-08-02: 30 mL

## 2019-08-02 MED ORDER — SODIUM CHLORIDE 0.9 % IV SOLN
INTRAVENOUS | Status: DC | PRN
  Administered 2019-08-02: 500 mL

## 2019-08-02 MED ORDER — ONDANSETRON HCL 4 MG/2ML IJ SOLN: 4.0000 mg | Freq: Four times a day (QID) | INTRAMUSCULAR | Status: DC | PRN

## 2019-08-02 MED ORDER — POTASSIUM CHLORIDE CRYS ER 20 MEQ PO TBCR: 20.0000 meq | EXTENDED_RELEASE_TABLET | Freq: Every day | ORAL | Status: DC | PRN

## 2019-08-02 MED ORDER — THYROID 60 MG PO TABS
97.5000 mg | ORAL_TABLET | ORAL | Status: DC
  Filled 2019-08-02: qty 2

## 2019-08-02 MED ORDER — CEFAZOLIN SODIUM-DEXTROSE 2-4 GM/100ML-% IV SOLN
2.0000 g | Freq: Three times a day (TID) | INTRAVENOUS | Status: AC
  Administered 2019-08-02 – 2019-08-03 (×2): 2 g via INTRAVENOUS
  Filled 2019-08-02 (×2): qty 100

## 2019-08-02 MED ORDER — ATROPINE SULFATE 0.4 MG/ML IJ SOLN
INTRAMUSCULAR | Status: DC | PRN
  Administered 2019-08-02: 0.4 mg via INTRAVENOUS

## 2019-08-02 MED ORDER — SUGAMMADEX SODIUM 200 MG/2ML IV SOLN
INTRAVENOUS | Status: DC | PRN
  Administered 2019-08-02: 200 mg via INTRAVENOUS

## 2019-08-02 MED ORDER — LABETALOL HCL 5 MG/ML IV SOLN: 10.0000 mg | INTRAVENOUS | Status: DC | PRN

## 2019-08-02 MED ORDER — FENTANYL CITRATE (PF) 100 MCG/2ML IJ SOLN
25.0000 ug | INTRAMUSCULAR | Status: DC | PRN
  Administered 2019-08-02 (×2): 25 ug via INTRAVENOUS

## 2019-08-02 MED ORDER — PROTAMINE SULFATE 10 MG/ML IV SOLN
INTRAVENOUS | Status: DC | PRN
  Administered 2019-08-02: 50 mg via INTRAVENOUS
  Administered 2019-08-02: 25 mg via INTRAVENOUS

## 2019-08-02 MED ORDER — LACTATED RINGERS IV SOLN
INTRAVENOUS | Status: DC | PRN
  Administered 2019-08-02 (×2): via INTRAVENOUS

## 2019-08-02 MED ORDER — MAGNESIUM SULFATE 2 GM/50ML IV SOLN: 2.0000 g | Freq: Every day | INTRAVENOUS | Status: DC | PRN

## 2019-08-02 MED ORDER — SODIUM CHLORIDE 0.9 % IV SOLN
INTRAVENOUS | Status: AC
  Filled 2019-08-02: qty 1.2

## 2019-08-02 MED ORDER — FENTANYL CITRATE (PF) 250 MCG/5ML IJ SOLN
INTRAMUSCULAR | Status: DC | PRN
  Administered 2019-08-02: 100 ug via INTRAVENOUS
  Administered 2019-08-02: 50 ug via INTRAVENOUS

## 2019-08-02 MED ORDER — FENTANYL CITRATE (PF) 100 MCG/2ML IJ SOLN
INTRAMUSCULAR | Status: AC
  Filled 2019-08-02: qty 2

## 2019-08-02 MED ORDER — DEXAMETHASONE SODIUM PHOSPHATE 10 MG/ML IJ SOLN
INTRAMUSCULAR | Status: DC | PRN
  Administered 2019-08-02: 5 mg via INTRAVENOUS

## 2019-08-02 MED ORDER — HEMOSTATIC AGENTS (NO CHARGE) OPTIME
TOPICAL | Status: DC | PRN
  Administered 2019-08-02: 1 via TOPICAL

## 2019-08-02 MED ORDER — HYDRALAZINE HCL 20 MG/ML IJ SOLN: 5.0000 mg | INTRAMUSCULAR | Status: DC | PRN

## 2019-08-02 MED ORDER — ACETAMINOPHEN 500 MG PO TABS
1000.0000 mg | ORAL_TABLET | Freq: Once | ORAL | Status: AC
  Administered 2019-08-02: 08:00:00 1000 mg via ORAL
  Filled 2019-08-02: qty 2

## 2019-08-02 MED ORDER — GUAIFENESIN-DM 100-10 MG/5ML PO SYRP: 15.0000 mL | ORAL_SOLUTION | ORAL | Status: DC | PRN

## 2019-08-02 MED ORDER — LIDOCAINE 2% (20 MG/ML) 5 ML SYRINGE
INTRAMUSCULAR | Status: DC | PRN
  Administered 2019-08-02: 80 mg via INTRAVENOUS

## 2019-08-02 MED ORDER — HEPARIN SODIUM (PORCINE) 1000 UNIT/ML IJ SOLN
INTRAMUSCULAR | Status: DC | PRN
  Administered 2019-08-02: 8000 [IU] via INTRAVENOUS

## 2019-08-02 MED ORDER — ONDANSETRON HCL 4 MG/2ML IJ SOLN: 4.0000 mg | Freq: Once | INTRAMUSCULAR | Status: DC | PRN

## 2019-08-02 MED ORDER — ROCURONIUM BROMIDE 10 MG/ML (PF) SYRINGE
PREFILLED_SYRINGE | INTRAVENOUS | Status: DC | PRN
  Administered 2019-08-02: 50 mg via INTRAVENOUS

## 2019-08-02 MED ORDER — MEPERIDINE HCL 25 MG/ML IJ SOLN
6.2500 mg | INTRAMUSCULAR | Status: DC | PRN
  Administered 2019-08-02: 12:00:00 6.25 mg via INTRAVENOUS

## 2019-08-02 SURGICAL SUPPLY — 73 items
BAG BANDED W/RUBBER/TAPE 36X54 (MISCELLANEOUS) ×3 IMPLANT
BAG DECANTER FOR FLEXI CONT (MISCELLANEOUS) ×2 IMPLANT
BALLN STERLING RX 5X30X80 (BALLOONS) ×3
BALLOON STERLING RX 5X30X80 (BALLOONS) IMPLANT
CANISTER SUCT 3000ML PPV (MISCELLANEOUS) ×3 IMPLANT
CATH ROBINSON RED A/P 18FR (CATHETERS) IMPLANT
CATH SUCT 10FR WHISTLE TIP (CATHETERS) ×1 IMPLANT
CLIP VESOCCLUDE MED 6/CT (CLIP) ×3 IMPLANT
CLIP VESOCCLUDE SM WIDE 6/CT (CLIP) ×3 IMPLANT
COVER DOME SNAP 22 D (MISCELLANEOUS) ×3 IMPLANT
COVER PROBE W GEL 5X96 (DRAPES) ×3 IMPLANT
COVER WAND RF STERILE (DRAPES) ×1 IMPLANT
DERMABOND ADVANCED (GAUZE/BANDAGES/DRESSINGS) ×4
DERMABOND ADVANCED .7 DNX12 (GAUZE/BANDAGES/DRESSINGS) ×1 IMPLANT
DRAPE INCISE IOBAN 66X45 STRL (DRAPES) ×4 IMPLANT
ELECT REM PT RETURN 9FT ADLT (ELECTROSURGICAL) ×3
ELECTRODE REM PT RTRN 9FT ADLT (ELECTROSURGICAL) ×1 IMPLANT
GLOVE BIO SURGEON STRL SZ7.5 (GLOVE) ×2 IMPLANT
GLOVE BIOGEL PI IND STRL 6.5 (GLOVE) IMPLANT
GLOVE BIOGEL PI IND STRL 7.5 (GLOVE) ×1 IMPLANT
GLOVE BIOGEL PI IND STRL 8 (GLOVE) IMPLANT
GLOVE BIOGEL PI INDICATOR 6.5 (GLOVE) ×2
GLOVE BIOGEL PI INDICATOR 7.5 (GLOVE) ×2
GLOVE BIOGEL PI INDICATOR 8 (GLOVE) ×4
GLOVE SS BIOGEL STRL SZ 6.5 (GLOVE) IMPLANT
GLOVE SUPERSENSE BIOGEL SZ 6.5 (GLOVE) ×2
GLOVE SURG SS PI 7.5 STRL IVOR (GLOVE) ×3 IMPLANT
GOWN STRL REUS W/ TWL LRG LVL3 (GOWN DISPOSABLE) ×2 IMPLANT
GOWN STRL REUS W/ TWL XL LVL3 (GOWN DISPOSABLE) ×1 IMPLANT
GOWN STRL REUS W/TWL LRG LVL3 (GOWN DISPOSABLE) ×4
GOWN STRL REUS W/TWL XL LVL3 (GOWN DISPOSABLE) ×2
GUIDEWIRE ENROUTE 0.014 (WIRE) ×5 IMPLANT
HEMOSTAT SNOW SURGICEL 2X4 (HEMOSTASIS) ×2 IMPLANT
INSERT FOGARTY SM (MISCELLANEOUS) IMPLANT
INTRODUCER KIT GALT 7CM (INTRODUCER) ×6
IV ADAPTER SYR DOUBLE MALE LL (MISCELLANEOUS) IMPLANT
KIT BASIN OR (CUSTOM PROCEDURE TRAY) ×3 IMPLANT
KIT ENCORE 26 ADVANTAGE (KITS) ×3 IMPLANT
KIT INTRODUCER GALT 7 (INTRODUCER) ×1 IMPLANT
KIT TURNOVER KIT B (KITS) ×3 IMPLANT
NDL HYPO 25GX1X1/2 BEV (NEEDLE) IMPLANT
NDL PERC 18GX7CM (NEEDLE) ×1 IMPLANT
NEEDLE HYPO 25GX1X1/2 BEV (NEEDLE) IMPLANT
NEEDLE PERC 18GX7CM (NEEDLE) ×3 IMPLANT
PACK CAROTID (CUSTOM PROCEDURE TRAY) ×3 IMPLANT
PACK UNIVERSAL I (CUSTOM PROCEDURE TRAY) ×1 IMPLANT
PAD ARMBOARD 7.5X6 YLW CONV (MISCELLANEOUS) ×6 IMPLANT
POSITIONER HEAD DONUT 9IN (MISCELLANEOUS) ×3 IMPLANT
SET MICROPUNCTURE 5F STIFF (MISCELLANEOUS) ×3 IMPLANT
SHEATH PINNACLE 5F 10CM (SHEATH) IMPLANT
SHUNT CAROTID BYPASS 12FRX15.5 (VASCULAR PRODUCTS) IMPLANT
STENT TRANSCAROTID SYS 10X30 (Permanent Stent) ×2 IMPLANT
STOPCOCK 4 WAY LG BORE MALE ST (IV SETS) ×3 IMPLANT
SUT ETHILON 3 0 PS 1 (SUTURE) IMPLANT
SUT MNCRL AB 4-0 PS2 18 (SUTURE) ×1 IMPLANT
SUT PROLENE 5 0 C 1 24 (SUTURE) ×4 IMPLANT
SUT PROLENE 6 0 BV (SUTURE) ×6 IMPLANT
SUT PROLENE 7 0 BV 1 (SUTURE) IMPLANT
SUT SILK 2 0 PERMA HAND 18 BK (SUTURE) ×3 IMPLANT
SUT SILK 2 0 SH CR/8 (SUTURE) ×1 IMPLANT
SUT SILK 3 0 (SUTURE)
SUT SILK 3-0 18XBRD TIE 12 (SUTURE) IMPLANT
SUT VIC AB 3-0 SH 27 (SUTURE) ×2
SUT VIC AB 3-0 SH 27X BRD (SUTURE) ×2 IMPLANT
SYR 10ML LL (SYRINGE) ×9 IMPLANT
SYR 20ML LL LF (SYRINGE) ×5 IMPLANT
SYR CONTROL 10ML LL (SYRINGE) IMPLANT
SYSTEM TRANSCAROTID NEUROPRTCT (MISCELLANEOUS) ×1 IMPLANT
TOWEL GREEN STERILE (TOWEL DISPOSABLE) ×3 IMPLANT
TRANSCAROTID NEUROPROTECT SYS (MISCELLANEOUS) ×3
WATER STERILE IRR 1000ML POUR (IV SOLUTION) ×3 IMPLANT
WIRE AMPLATZ SS-J .035X180CM (WIRE) IMPLANT
WIRE BENTSON .035X145CM (WIRE) ×3 IMPLANT

## 2019-08-02 NOTE — Transfer of Care (Signed)
Immediate Anesthesia Transfer of Care Note  Patient: Barb Merino  Procedure(s) Performed: TRANSCAROTID ARTERY REVASCULARIZATION RIGHT (Right )  Patient Location: PACU  Anesthesia Type:General  Level of Consciousness: awake, alert , oriented and drowsy  Airway & Oxygen Therapy: Patient Spontanous Breathing and Patient connected to nasal cannula oxygen  Post-op Assessment: Report given to RN, Post -op Vital signs reviewed and stable and Patient moving all extremities X 4  Post vital signs: Reviewed and stable  Last Vitals:  Vitals Value Taken Time  BP    Temp    Pulse    Resp 22 08/02/19 1119  SpO2    Vitals shown include unvalidated device data.  Last Pain:  Vitals:   08/02/19 0828  TempSrc:   PainSc: 0-No pain         Complications: No apparent anesthesia complications

## 2019-08-02 NOTE — Progress Notes (Signed)
patient arrived the unit from PACU on a hospital bed , assessment completed see flow sheet, placed on tele ccmd notified, patient oriented to room and staff, bed in lowest position call bell within reach will continue to monitor.

## 2019-08-02 NOTE — Progress Notes (Signed)
Status post right trans-carotid artery stenting with flow reversal  Neurologically stable in the PACU, moving all 4 extremities to command.  Incisions are without hematoma.  Stable to transfer to the floor.  Annamarie Major

## 2019-08-02 NOTE — Interval H&P Note (Signed)
History and Physical Interval Note:  08/02/2019 8:48 AM  Tabitha Schmidt  has presented today for surgery, with the diagnosis of RIGHT CAROTID ARTERY STENOSIS.  The various methods of treatment have been discussed with the patient and family. After consideration of risks, benefits and other options for treatment, the patient has consented to  Procedure(s): TRANSCAROTID ARTERY REVASCULARIZATION RIGHT (Right) as a surgical intervention.  The patient's history has been reviewed, patient examined, no change in status, stable for surgery.  I have reviewed the patient's chart and labs.  Questions were answered to the patient's satisfaction.     Annamarie Major

## 2019-08-02 NOTE — Anesthesia Procedure Notes (Signed)
Procedure Name: Intubation Date/Time: 08/02/2019 9:33 AM Performed by: Gaylene Brooks, CRNA Pre-anesthesia Checklist: Patient identified, Emergency Drugs available, Suction available and Patient being monitored Patient Re-evaluated:Patient Re-evaluated prior to induction Oxygen Delivery Method: Circle System Utilized Preoxygenation: Pre-oxygenation with 100% oxygen Induction Type: IV induction Ventilation: Mask ventilation without difficulty Laryngoscope Size: Miller and 2 Grade View: Grade II Tube type: Oral Number of attempts: 1 Airway Equipment and Method: Stylet and Oral airway Placement Confirmation: ETT inserted through vocal cords under direct vision,  positive ETCO2 and breath sounds checked- equal and bilateral Secured at: 22 cm Tube secured with: Tape Dental Injury: Teeth and Oropharynx as per pre-operative assessment

## 2019-08-02 NOTE — Op Note (Signed)
Patient name: Tabitha Schmidt MRN: [id number] DOB: [birth year] Sex: female  08/02/2019 Pre-operative Diagnosis: Asymptomatic right carotid stenosis Post-operative diagnosis:  Same Surgeon:  Durene Cal Assistants:  Clotilde Dieter Procedure:   #1:  Right TCAR (trans-carotid revascularization with flow reversal)   #2:  U/s guided access, left femoral vein Anesthesia:  General Blood Loss:  minimal Specimens:  none  Findings:  90% right carotid stenosis, < %10 post stenting  Stent: 10 x 30 Predilation balloon: 5 x 30 Contrast: 15 cc Radiation dose: 611.85 (Gy/cm3) Procedure time: 45 minutes Fluoroscopy time: 2.3 minutes  Indications: The patient has an asymptomatic right carotid stenosis.  She has recently undergone PCI for a MI which makes her high risk therefore stenting was recommended.  Procedure:  The patient was identified in the holding area and taken to [facility] OR ROOM 16  The patient was then placed supine on the table. general anesthesia was administered.  The patient was prepped and draped in the usual sterile fashion.  A time out was called and antibiotics were administered.   Ultrasound was used to evaluate the left common femoral vein which is widely patent and easily compressible.  The common femoral vein on the left was cannulated with an 18-gauge needle.  An 035 wire was advanced without resistance.  The TCAR sheath was inserted.  It flushed easily.  Ultrasound was used to locate the carotid artery at the level of the clavicle.  A transverse incision was made 1 fingerbreadth above the clavicle.  Cautery was used to divide subcutaneous tissue as well as the platysma muscle.  The avascular plane between the 2 heads of the sternocleidomastoid muscle was identified.  Sharp dissection was then used to expose the carotid artery.  The internal jugular vein was reflected laterally and the vagus nerve was visualized and protected.  On umbilical tape and a vessel loop were placed proximally on  the common carotid artery.  The carotid artery was disease-free at this level.  The patient was fully heparinized.  ACT was confirmed to be greater than 200.  Each ear stitch with a 5-0 Prolene was placed in the common carotid artery and the artery was then cannulated with a micropuncture needle.  The 018 wire was then inserted and the micropuncture sheath was placed to 3 cm.  Contrast injections were performed locating the lesion.  Multiple views were obtained to make sure there is no dissection at the cannulation site.  The carotid Amplatz wire was then inserted to the level of the lesion.  The TCAR sheath was then placed and sutured into position.  2 views of the lesion were then obtained making sure there was no dissection of the cannulation site.  A 5 x 30 balloon and 018 wire were inserted into the sheath.  At this point a TCAR timeout was performed.  Heart rate was greater than 70 and systolic blood pressure was greater than 140.  Next, the flow reversal system was checked to make sure that it was functioning properly.  The proximal carotid artery was then occluded by tightening the vessel loop and active flow reversal on high flow was begun after confirming that it was successfully functioning.  Using the 018 wire, the stenosis within the carotid bifurcation was crossed without difficulty and the wire was advanced into the distal internal carotid artery.  The lesion was 90%.  Next the 5 x 30 balloon was positioned across the stenosis and predilation was performed.  The patient did become  temporarily bradycardic and was given half a milligram of atropine.  The balloon was removed.  The hemodynamics were stable.  A 10 x 30 stent was selected and then deployed across the lesion in the internal carotid artery extending proximally into the common carotid artery.  We waited approximately 2 minutes and then performed completion imaging which showed resolution of the stenosis, now less than 10%.  The wire was then  removed.  The flow reversal system was discontinued and the blood in the system was returned to the patient.  The sheath was removed and the U stitch was used to close the arteriotomy site.  50 mg of protamine was then administered.  After the protamine had circulated, the femoral sheath was removed and manual pressure was held for hemostasis.  Next, hemostasis was obtained in the carotid incision.  Once this was done, the heads of the sternocleidomastoid were reapproximated together with 3-0 Vicryl.  The platysma muscle was closed with 3-0 Vicryl and the skin was closed with 3-0 Vicryl followed by Dermabond.  The patient was successfully extubated and taken to recovery room, moving all 4 extremities and responding appropriately to commands.   Disposition: To PACU stable   V. Annamarie Major, M.D., [facility] [place] Office: [phone] Pager:  [phone]

## 2019-08-03 LAB — CBC
HCT: 34 % — ABNORMAL LOW (ref 36.0–46.0)
Hemoglobin: 11.8 g/dL — ABNORMAL LOW (ref 12.0–15.0)
MCH: 31.9 pg (ref 26.0–34.0)
MCHC: 34.7 g/dL (ref 30.0–36.0)
MCV: 91.9 fL (ref 80.0–100.0)
Platelets: 208 10*3/uL (ref 150–400)
RBC: 3.7 MIL/uL — ABNORMAL LOW (ref 3.87–5.11)
RDW: 12.4 % (ref 11.5–15.5)
WBC: 8.9 10*3/uL (ref 4.0–10.5)
nRBC: 0 % (ref 0.0–0.2)

## 2019-08-03 LAB — BASIC METABOLIC PANEL
Anion gap: 8 (ref 5–15)
BUN: 11 mg/dL (ref 8–23)
CO2: 23 mmol/L (ref 22–32)
Calcium: 8.7 mg/dL — ABNORMAL LOW (ref 8.9–10.3)
Chloride: 108 mmol/L (ref 98–111)
Creatinine, Ser: 0.69 mg/dL (ref 0.44–1.00)
GFR calc Af Amer: >60 mL/min (ref >60)
GFR calc non Af Amer: >60 mL/min (ref >60)
Glucose, Bld: 126 mg/dL — ABNORMAL HIGH (ref 70–99)
Potassium: 3.9 mmol/L (ref 3.5–5.1)
Sodium: 139 mmol/L (ref 135–145)

## 2019-08-03 LAB — POCT ACTIVATED CLOTTING TIME: Activated Clotting Time: 323 seconds

## 2019-08-03 MED ORDER — OXYCODONE-ACETAMINOPHEN 5-325 MG PO TABS: 1.0000 | ORAL_TABLET | Freq: Four times a day (QID) | ORAL | 0 refills | Status: DC | PRN

## 2019-08-03 NOTE — Progress Notes (Signed)
Mrs. Tabitha Schmidt was extremely impatient about leaving.  Continually called nurse into room and on phone questioning when she could leave.  Patient also was in the hall demanding a time to leave.  Explained that this nurse would do her best to get her out of the door at 10:30, which she acknowledged.

## 2019-08-03 NOTE — Plan of Care (Signed)

## 2019-08-03 NOTE — Anesthesia Postprocedure Evaluation (Signed)
Anesthesia Post Note  Patient: Tabitha Schmidt  Procedure(s) Performed: TRANSCAROTID ARTERY REVASCULARIZATION RIGHT (Right )     Patient location during evaluation: PACU Anesthesia Type: General Level of consciousness: awake and alert Pain management: pain level controlled Vital Signs Assessment: post-procedure vital signs reviewed and stable Respiratory status: spontaneous breathing, nonlabored ventilation, respiratory function stable and patient connected to nasal cannula oxygen Cardiovascular status: blood pressure returned to baseline and stable Postop Assessment: no apparent nausea or vomiting Anesthetic complications: no    Last Vitals:  Vitals:   08/03/19 0326 08/03/19 0750  BP: (!) 105/50 (!) 94/51  Pulse: 64   Resp: 18 20  Temp: 36.5 C 36.8 C  SpO2: 97% 95%    Last Pain:  Vitals:   08/03/19 0800  TempSrc:   PainSc: 0-No pain                 Catalina Gravel

## 2019-08-03 NOTE — Discharge Instructions (Signed)
   [facility] [place]  Discharge Instructions   Carotid Endarterectomy (CEA)  Please refer to the following instructions for your post-procedure care. Your surgeon or physician assistant will discuss any changes with you.  Activity  You are encouraged to walk as much as you can. You can slowly return to normal activities but must avoid strenuous activity and heavy lifting until your doctor tell you it's OK. Avoid activities such as vacuuming or swinging a golf club. You can drive after one week if you are comfortable and you are no longer taking prescription pain medications. It is normal to feel tired for serval weeks after your surgery. It is also normal to have difficulty with sleep habits, eating, and bowel movements after surgery. These will go away with time.  Bathing/Showering  You may shower after you come home. Do not soak in a bathtub, hot tub, or swim until the incision heals completely.  Incision Care  Shower every day. Clean your incision with mild soap and water. Pat the area dry with a clean towel. You do not need a bandage unless otherwise instructed. Do not apply any ointments or creams to your incision. You may have skin glue on your incision. Do not peel it off. It will come off on its own in about one week. Your incision may feel thickened and raised for several weeks after your surgery. This is normal and the skin will soften over time. For Men Only: It's OK to shave around the incision but do not shave the incision itself for 2 weeks. It is common to have numbness under your chin that could last for several months.  Diet  Resume your normal diet. There are no special food restrictions following this procedure. A low fat/low cholesterol diet is recommended for all patients with vascular disease. In order to heal from your surgery, it is CRITICAL to get adequate nutrition. Your body requires vitamins, minerals, and protein. Vegetables are the best  source of vitamins and minerals. Vegetables also provide the perfect balance of protein. Processed food has little nutritional value, so try to avoid this.        Medications  Resume taking all of your medications unless your doctor or physician assistant tells you not to. If your incision is causing pain, you may take over-the- counter pain relievers such as acetaminophen (Tylenol). If you were prescribed a stronger pain medication, please be aware these medications can cause nausea and constipation. Prevent nausea by taking the medication with a snack or meal. Avoid constipation by drinking plenty of fluids and eating foods with a high amount of fiber, such as fruits, vegetables, and grains. Do not take Tylenol if you are taking prescription pain medications.  Follow Up  Our office will schedule a follow up appointment 2-3 weeks following discharge.  Please call us immediately for any of the following conditions  Increased pain, redness, drainage (pus) from your incision site. Fever of 101 degrees or higher. If you should develop stroke (slurred speech, difficulty swallowing, weakness on one side of your body, loss of vision) you should call 911 and go to the nearest emergency room.  Reduce your risk of vascular disease:  Stop smoking. If you would like help call [facility] at 1-800-QUIT-NOW (1-800-784-8669) or [place] at 336-586-4000. Manage your cholesterol Maintain a desired weight Control your diabetes Keep your blood pressure down  If you have any questions, please call the office at [phone].   

## 2019-08-03 NOTE — Progress Notes (Addendum)
  Progress Note    08/03/2019 8:32 AM 1 Day Post-Op  Subjective:  Denies stroke like symptoms.  Wants to go home   Vitals:   08/03/19 0326 08/03/19 0750  BP: (!) 105/50 (!) 94/51  Pulse: 64   Resp: 18 20  Temp: 97.7 F (36.5 C) 98.3 F (36.8 C)  SpO2: 97% 95%   Physical Exam: Lungs:  Non labored Incisions:  R neck incision c/d/i; L vein cath site without hematoma Neurologic: CN grossly intact  CBC    Component Value Date/Time   WBC 8.9 08/03/2019 0353   RBC 3.70 (L) 08/03/2019 0353   HGB 11.8 (L) 08/03/2019 0353   HCT 34.0 (L) 08/03/2019 0353   PLT 208 08/03/2019 0353   MCV 91.9 08/03/2019 0353   MCH 31.9 08/03/2019 0353   MCHC 34.7 08/03/2019 0353   RDW 12.4 08/03/2019 0353   LYMPHSABS 2.0 05/17/2019 2337   MONOABS 0.5 05/17/2019 2337   EOSABS 0.1 05/17/2019 2337   BASOSABS 0.0 05/17/2019 2337    BMET    Component Value Date/Time   NA 139 08/03/2019 0353   K 3.9 08/03/2019 0353   CL 108 08/03/2019 0353   CO2 23 08/03/2019 0353   GLUCOSE 126 (H) 08/03/2019 0353   BUN 11 08/03/2019 0353   CREATININE 0.69 08/03/2019 0353   CALCIUM 8.7 (L) 08/03/2019 0353   GFRNONAA >60 08/03/2019 0353   GFRAA >60 08/03/2019 0353    INR    Component Value Date/Time   INR 1.0 08/01/2019 1032     Intake/Output Summary (Last 24 hours) at 08/03/2019 4431 Last data filed at 08/02/2019 1119 Gross per 24 hour  Intake 1000 ml  Output 100 ml  Net 900 ml     Assessment/Plan:  63 y.o. female is s/p R TCAR 1 Day Post-Op   Neuro exam at baseline [facility] for d/c home today Carotid duplex in 4 weeks with follow up office visit   Dagoberto Ligas, PA-C [facility] [phone] 08/03/2019 8:32 AM  I have seen and evaluated the patient. I agree with the PA note as documented above. POD#1 s/p TCAR.  Neuro intact.  Neck incision and groin incision looks good.  Plan for d/c home today.  Continue aspirin and Plavix.  F/U 4 weeks with carotid duplex.   Marty Heck, MD [facility] [place]: [phone] Pager: [phone]

## 2019-08-03 NOTE — Progress Notes (Signed)
Discharge instructions given.  Discussed signs and symptoms to watch for and when to contact the physician.  Discussed follow up appointments.  Discussed activities and lifting restrictions.  Discussed new medications, medications changes, and side effects, verbalized understanding.

## 2019-08-03 NOTE — Plan of Care (Signed)
  Problem: Education: Goal: Knowledge of General Education information will improve Description: Including pain rating scale, medication(s)/side effects and non-pharmacologic comfort measures 08/03/2019 1029 by Glenard Haring, RN Outcome: Adequate for Discharge 08/03/2019 0939 by Glenard Haring, RN Outcome: Progressing   Problem: Health Behavior/Discharge Planning: Goal: Ability to manage health-related needs will improve 08/03/2019 1029 by Glenard Haring, RN Outcome: Adequate for Discharge 08/03/2019 0939 by Glenard Haring, RN Outcome: Progressing   Problem: Clinical Measurements: Goal: Ability to maintain clinical measurements within normal limits will improve 08/03/2019 1029 by Glenard Haring, RN Outcome: Adequate for Discharge 08/03/2019 0939 by Glenard Haring, RN Outcome: Progressing Goal: Will remain free from infection 08/03/2019 1029 by Glenard Haring, RN Outcome: Adequate for Discharge 08/03/2019 0939 by Glenard Haring, RN Outcome: Progressing Goal: Diagnostic test results will improve 08/03/2019 1029 by Glenard Haring, RN Outcome: Adequate for Discharge 08/03/2019 0939 by Glenard Haring, RN Outcome: Progressing Goal: Respiratory complications will improve 08/03/2019 1029 by Glenard Haring, RN Outcome: Adequate for Discharge 08/03/2019 0939 by Glenard Haring, RN Outcome: Progressing Goal: Cardiovascular complication will be avoided 08/03/2019 1029 by Glenard Haring, RN Outcome: Adequate for Discharge 08/03/2019 0939 by Glenard Haring, RN Outcome: Progressing   Problem: Activity: Goal: Risk for activity intolerance will decrease 08/03/2019 1029 by Glenard Haring, RN Outcome: Adequate for Discharge 08/03/2019 0939 by Glenard Haring, RN Outcome: Progressing   Problem: Nutrition: Goal: Adequate nutrition will be maintained 08/03/2019 1029 by Glenard Haring, RN Outcome: Adequate for Discharge 08/03/2019 0939 by Glenard Haring, RN Outcome: Progressing   Problem: Coping: Goal: Level of anxiety will decrease 08/03/2019 1029 by Glenard Haring, RN Outcome: Adequate for Discharge 08/03/2019 0939 by Glenard Haring, RN Outcome: Progressing   Problem: Elimination: Goal: Will not experience complications related to bowel motility 08/03/2019 1029 by Glenard Haring, RN Outcome: Adequate for Discharge 08/03/2019 0939 by Glenard Haring, RN Outcome: Progressing Goal: Will not experience complications related to urinary retention 08/03/2019 1029 by Glenard Haring, RN Outcome: Adequate for Discharge 08/03/2019 0939 by Glenard Haring, RN Outcome: Progressing   Problem: Pain Managment: Goal: General experience of comfort will improve 08/03/2019 1029 by Glenard Haring, RN Outcome: Adequate for Discharge 08/03/2019 0939 by Glenard Haring, RN Outcome: Progressing   Problem: Safety: Goal: Ability to remain free from injury will improve 08/03/2019 1029 by Glenard Haring, RN Outcome: Adequate for Discharge 08/03/2019 0939 by Glenard Haring, RN Outcome: Progressing   Problem: Skin Integrity: Goal: Risk for impaired skin integrity will decrease 08/03/2019 1029 by Glenard Haring, RN Outcome: Adequate for Discharge 08/03/2019 0939 by Glenard Haring, RN Outcome: Progressing

## 2019-08-03 NOTE — Discharge Summary (Signed)
Discharge Summary     Tabitha Schmidt [birth year] 63 y.o. female  [id number]  Admission Date: 08/02/2019  Discharge Date: 08/03/19  Physician: Nada Libman, MD  Admission Diagnosis: RIGHT CAROTID ARTERY STENOSIS  Discharge Day services:    see progress note 08/03/19 Physical Exam: Vitals:   08/03/19 0326 08/03/19 0750  BP: (!) 105/50 (!) 94/51  Pulse: 64   Resp: 18 20  Temp: 97.7 F (36.5 C) 98.3 F (36.8 C)  SpO2: 97% 95%    Hospital Course:  The patient was admitted to the hospital and taken to the operating room on 08/02/2019 and underwent right transcarotid revascularization.  The pt tolerated the procedure well and was transported to the PACU in good condition.   By POD 1, the pt neuro status remained at baseline.  The remainder of the hospital course consisted of increasing mobilization and increasing intake of solids without difficulty.   Recent Labs    08/01/19 1032 08/03/19 0353  NA 141 139  K 4.2 3.9  CL 108 108  CO2 24 23  GLUCOSE 91 126*  BUN 9 11  CALCIUM 9.4 8.7*   Recent Labs    08/01/19 1032 08/03/19 0353  WBC 6.3 8.9  HGB 14.5 11.8*  HCT 44.1 34.0*  PLT 284 208   Recent Labs    08/01/19 1032  INR 1.0       Discharge Diagnosis:  RIGHT CAROTID ARTERY STENOSIS  Secondary Diagnosis: Patient Active Problem List   Diagnosis Date Noted  . Carotid artery stenosis 08/02/2019  . Chest pain 05/18/2019  . MI (myocardial infarction) (HCC) 05/18/2019  . Non-ST elevation (NSTEMI) myocardial infarction (HCC)   . Elevated troponin   . Hyperlipidemia   . Chronic obstructive pulmonary disease (HCC)   . Chest pain in adult 05/13/2019  . Hyperglycemia 07/10/2015  . Vitamin B12 deficiency 05/06/2015  . Vitamin D deficiency 05/06/2015  . Chronic fatigue 12/16/2014  . Hypothyroidism 12/16/2014  . GERD (gastroesophageal reflux disease) 01/03/2012  . Plantar fasciitis 05/26/2011  . PLANTAR FACIITIS 11/24/2010   Past Medical History:   Diagnosis Date  . Anxiety   . Arthritis   . Asthma   . Cataracts, bilateral    [name]  . Complication of anesthesia    Note: pt has hx of TMJ " every now and then my jaw will lock "  . COPD (chronic obstructive pulmonary disease) (HCC)   . Coronary artery disease    a. s/p DES to mid-RCA in 05/2019  . DM (diabetes mellitus) (HCC)    "came off medicines with dr's permission"  . Family history of adverse reaction to anesthesia    " my son woke up crying "  . Fatty liver   . Fibromyalgia   . GERD (gastroesophageal reflux disease)   . Hyperlipidemia    stopped meds on her own  . Hypothyroid   . Myocardial infarction (HCC)   . On supplemental oxygen therapy    at night and PRN  . Pneumonia   . Shortness of breath   . Stenosis of right carotid artery   . TMJ (dislocation of temporomandibular joint)     Allergies as of 08/03/2019      Reactions   Zegerid [omeprazole-sodium Bicarbonate]    Irregular Heart Beat      Medication List    TAKE these medications   aspirin 81 MG EC tablet Take 1 tablet (81 mg total) by mouth daily.   atorvastatin 80 MG tablet Commonly known  as: LIPITOR Take 1 tablet (80 mg total) by mouth daily at 6 PM. What changed: when to take this   clopidogrel 75 MG tablet Commonly known as: PLAVIX Take 300 mg ( 4 tablets) the first day, then take 75 mg ( 1 tablet) daily thereafter   cyanocobalamin 1000 MCG/ML injection Commonly known as: (VITAMIN B-12) Inject 1 mL into the muscle every 30 (thirty) days.   LORazepam 1 MG tablet Commonly known as: ATIVAN Take 1 mg by mouth 2 (two) times daily as needed for anxiety.   metoprolol tartrate 25 MG tablet Commonly known as: LOPRESSOR Take 0.5 tablets (12.5 mg total) by mouth 2 (two) times daily.   NALTREXONE HCL PO Take 4.5 mg by mouth every morning.   oxyCODONE-acetaminophen 5-325 MG tablet Commonly known as: PERCOCET/ROXICET Take 1 tablet by mouth every 6 (six) hours as needed for moderate pain.    pantoprazole 40 MG tablet Commonly known as: PROTONIX Take 1 tablet (40 mg total) by mouth daily.   ProAir HFA 108 (90 Base) MCG/ACT inhaler Generic drug: albuterol Inhale 1 puff into the lungs every 6 (six) hours as needed for wheezing or shortness of breath.   Symbicort 160-4.5 MCG/ACT inhaler Generic drug: budesonide-formoterol Inhale 1 puff into the lungs daily as needed (shortness of breath and wheezing).   Thyroid 97.5 MG Tabs Take 97.5 mg by mouth every morning.        Discharge Instructions:   [facility] Discharge Instructions Carotid Endarterectomy (CEA)  Please refer to the following instructions for your post-procedure care. Your surgeon or physician assistant will discuss any changes with you.  Activity  You are encouraged to walk as much as you can. You can slowly return to normal activities but must avoid strenuous activity and heavy lifting until your doctor tell you it's OK. Avoid activities such as vacuuming or swinging a golf club. You can drive after one week if you are comfortable and you are no longer taking prescription pain medications. It is normal to feel tired for serval weeks after your surgery. It is also normal to have difficulty with sleep habits, eating, and bowel movements after surgery. These will go away with time.  Bathing/Showering  You may shower after you come home. Do not soak in a bathtub, hot tub, or swim until the incision heals completely.  Incision Care  Shower every day. Clean your incision with mild soap and water. Pat the area dry with a clean towel. You do not need a bandage unless otherwise instructed. Do not apply any ointments or creams to your incision. You may have skin glue on your incision. Do not peel it off. It will come off on its own in about one week. Your incision may feel thickened and raised for several weeks after your surgery. This is normal and the skin will soften over time.  For Men Only: It's OK to shave around the incision but do not shave the incision itself for 2 weeks. It is common to have numbness under your chin that could last for several months.  Diet  Resume your normal diet. There are no special food restrictions following this procedure. A low fat/low cholesterol diet is recommended for all patients with vascular disease. In order to heal from your surgery, it is CRITICAL to get adequate nutrition. Your body requires vitamins, minerals, and protein. Vegetables are the best source of vitamins and minerals. Vegetables also provide the perfect balance of protein. Processed food has little  nutritional value, so try to avoid this.  Medications  Resume taking all of your medications unless your doctor or physician assistant tells you not to.  If your incision is causing pain, you may take over-the- counter pain relievers such as acetaminophen (Tylenol). If you were prescribed a stronger pain medication, please be aware these medications can cause nausea and constipation.  Prevent nausea by taking the medication with a snack or meal. Avoid constipation by drinking plenty of fluids and eating foods with a high amount of fiber, such as fruits, vegetables, and grains. Do not take Tylenol if you are taking prescription pain medications.  Follow Up  Our office will schedule a follow up appointment 2-3 weeks following discharge.  Please call us immediately for any of the following conditions  Increased pain, redness, drainage (pus) from your incision site. Fever of 101 degrees or higher. If you should develop stroke (slurred speech, difficulty swallowing, weakness on one side of your body, loss of vision) you should call 911 and go to the nearest emergency room.  Reduce your risk of vascular disease:  Stop smoking. If you would like help call [facility] at 1-800-QUIT-NOW (518 152 1899) or [place] at (912)794-2043. Manage your cholesterol Maintain a desired  weight Control your diabetes Keep your blood pressure down  If you have any questions, please call the office at [phone].  Disposition: home  Patient's condition: is Good  Follow up: 1. Dr. Myra Gianotti in 4 weeks.   Emilie Rutter, PA-C [facility] [phone]   --- For [facility] Registry use ---   Modified Rankin score at D/C (0-6): 0  IV medication needed for:  1. Hypertension: No 2. Hypotension: No  Post-op Complications: No  1. Post-op CVA or TIA: No  If yes: Event classification (right eye, left eye, right cortical, left cortical, verterobasilar, other):   If yes: Timing of event (intra-op, <6 hrs post-op, >=6 hrs post-op, unknown):   2. CN injury: No  If yes: CN  injuried   3. Myocardial infarction: No  If yes: Dx by (EKG or clinical, Troponin):   4.  CHF: No  5.  Dysrhythmia (new): No  6. Wound infection: No  7. Reperfusion symptoms: No  8. Return to OR: No  If yes: return to OR for (bleeding, neurologic, other CEA incision, other):   Discharge medications: Statin use:  Yes ASA use:  Yes   Beta blocker use:  Yes ACE-Inhibitor use:  No  ARB use:  No CCB use: No P2Y12 Antagonist use: Yes, [x ] Plavix, [ ]  Plasugrel, [ ]  Ticlopinine, [ ]  Ticagrelor, [ ]  Other, [ ]  No for medical reason, [ ]  Non-compliant, [ ]  Not-indicated Anti-coagulant use:  No, [ ]  Warfarin, [ ]  Rivaroxaban, [ ]  Dabigatran,

## 2019-08-05 ENCOUNTER — Encounter (HOSPITAL_COMMUNITY): Payer: Self-pay | Admitting: Surgery

## 2019-08-19 ENCOUNTER — Encounter (HOSPITAL_COMMUNITY): Payer: Medicare Other

## 2019-08-22 ENCOUNTER — Other Ambulatory Visit: Payer: Self-pay

## 2019-08-22 ENCOUNTER — Encounter (HOSPITAL_COMMUNITY)
Admission: RE | Admit: 2019-08-22 | Discharge: 2019-08-22 | Disposition: A | Discharge status: Home / Self Care | Payer: Medicare Other | Source: Ambulatory Visit | Attending: Cardiovascular Disease | Admitting: Cardiovascular Disease

## 2019-08-22 VITALS — BP 122/84 | HR 73 | Temp 99.8°F | Ht 67.0 in | Wt 177.8 lb

## 2019-08-22 DIAGNOSIS — R002 Palpitations: Secondary | ICD-10-CM | POA: Insufficient documentation

## 2019-08-22 DIAGNOSIS — I214 Non-ST elevation (NSTEMI) myocardial infarction: Secondary | ICD-10-CM | POA: Diagnosis not present

## 2019-08-22 NOTE — Progress Notes (Signed)
Cardiac/Pulmonary Rehab Medication Review by a Pharmacist  Does the patient  feel that his/her medications are working for him/her?  yes  Has the patient been experiencing any side effects to the medications prescribed?  no  Does the patient measure his/her own blood pressure or blood glucose at home?  no   Does the patient have any problems obtaining medications due to transportation or finances?   no  Understanding of regimen: good Understanding of indications: good Potential of compliance: good  Questions asked to Determine Patient Understanding of Medication Regimen:  1. What is the name of the medication?  2. What is the medication used for?  3. When should it be taken?  4. How much should be taken?  5. How will you take it?  6. What side effects should you report?  Understanding Defined as: Excellent: All questions above are correct Good: Questions 1-4 are correct Fair: Questions 1-2 are correct  Poor: 1 or none of the above questions are correct   Pharmacist comments: Patient presents today for cardiac rehab. We reviewed her medications. She is tolerating and compliant with current regimen. The Patient would like a prescription for Chantix to help stop smoking. She has restarted smoking after years of not smoking. Her only complaint was that she is often still short of breath. She wants to " build up her lung strength".She can't catch her breath sometimes. Albuterol IH does help some. Patient is not monitoring her BP or HR. Suggested she should monitor periodically. Continue current regimen and ask MD for Chantix prescription.  Thanks for the opportunity to participate in the care of this patient,  Isac Sarna, BS [place], [place] Pharmacist Pager [phone] 08/22/2019 2:06 PM

## 2019-08-22 NOTE — Progress Notes (Signed)
Daily Session Note  Patient Details  Name: Tabitha Schmidt MRN: [id number] Date of Birth: 1956/05/11 Referring Provider:     CARDIAC REHAB PHASE II ORIENTATION from 08/22/2019 in [place]  Referring Provider  Bronson Ing       Encounter Date: 08/22/2019  Check In: Session Check In - 08/22/19 1452      Check-In   Supervising physician immediately available to respond to emergencies  See telemetry face sheet for immediately available MD    Location  [facility]    Staff Present  Benay Pike, Exercise Physiologist;Jesscia Imm, MS, EP, CHC, Exercise Physiologist;Debra Wynetta Emery, RN, BSN    Virtual Visit  No    Medication changes reported      No    Fall or balance concerns reported     No    Tobacco Cessation  Use Decreased   Still smoking 4 cigs/day.   Resistance Training Performed  Yes    VAD Patient?  No    PAD/SET Patient?  No      Pain Assessment   Currently in Pain?  No/denies    Pain Score  0-No pain    Multiple Pain Sites  No       Capillary Blood Glucose: No results found for this or any previous visit (from the past 24 hour(s)).  Exercise Prescription Changes - 08/22/19 1400      Response to Exercise   Blood Pressure (Admit)  122/84    Blood Pressure (Exercise)  156/82    Blood Pressure (Exit)  136/82    Heart Rate (Admit)  73 bpm    Heart Rate (Exercise)  99 bpm    Heart Rate (Exit)  76 bpm    Oxygen Saturation (Admit)  95 %    Oxygen Saturation (Exercise)  95 %    Oxygen Saturation (Exit)  96 %    Rating of Perceived Exertion (Exercise)  10    Perceived Dyspnea (Exercise)  13    Symptoms  SOB    Comments  6MWT    Duration  Progress to 30 minutes of  aerobic without signs/symptoms of physical distress    Intensity  THRR New   [phone]     Home Exercise Plan   Plans to continue exercise at  Home (comment)   walking    Frequency  Add 2 additional days to program exercise sessions.    Initial Home Exercises  Provided  08/22/19       Social History   Tobacco Use  Smoking Status Current Every Day Smoker  . Packs/day: 0.50  . Years: 40.00  . Pack years: 20.00  . Types: Cigarettes  Smokeless Tobacco Never Used    Goals Met:  Independence with exercise equipment Exercise tolerated well Personal goals reviewed No report of cardiac concerns or symptoms Strength training completed today  Goals Unmet:  Not Applicable  Comments: Check out 1430   Dr. Kate Sable is Medical Director for [facility] and [facility] Re[facility].

## 2019-08-22 NOTE — Progress Notes (Signed)
Cardiac Individual Treatment Plan  Patient Details  Name: CHESSIE NEUHARTH MRN: [id number] Date of Birth: 04/28/1956 Referring Provider:     CARDIAC REHAB PHASE II ORIENTATION from 08/22/2019 in [facility] CARDIAC REHABILITATION  Referring Provider  [name]       Initial Encounter Date:    CARDIAC REHAB PHASE II ORIENTATION from 08/22/2019 in [facility] CARDIAC REHABILITATION  Date  08/22/19      Visit Diagnosis: NSTEMI (non-ST elevated myocardial infarction) [facility])  Patient's Home Medications on Admission:  Current Outpatient Medications:  .  aspirin EC 81 MG EC tablet, Take 1 tablet (81 mg total) by mouth daily., Disp: 30 tablet, Rfl: 3 .  atorvastatin (LIPITOR) 80 MG tablet, Take 1 tablet (80 mg total) by mouth daily at 6 PM. (Patient taking differently: Take 80 mg by mouth daily. ), Disp: 90 tablet, Rfl: 3 .  clopidogrel (PLAVIX) 75 MG tablet, Take 300 mg ( 4 tablets) the first day, then take 75 mg ( 1 tablet) daily thereafter, Disp: 94 tablet, Rfl: 3 .  cyanocobalamin (,VITAMIN B-12,) 1000 MCG/ML injection, Inject 1 mL into the muscle every 30 (thirty) days., Disp: , Rfl:  .  LORazepam (ATIVAN) 1 MG tablet, Take 1 mg by mouth 2 (two) times daily as needed for anxiety. , Disp: , Rfl:  .  metoprolol tartrate (LOPRESSOR) 25 MG tablet, Take 0.5 tablets (12.5 mg total) by mouth 2 (two) times daily., Disp: 60 tablet, Rfl: 0 .  NALTREXONE HCL PO, Take 4.5 mg by mouth every morning., Disp: , Rfl:  .  NP THYROID 15 MG tablet, Take 15 mg by mouth daily., Disp: , Rfl:  .  NP THYROID 90 MG tablet, Take 1 tablet by mouth daily before breakfast., Disp: , Rfl:  .  oxyCODONE-acetaminophen (PERCOCET/ROXICET) 5-325 MG tablet, Take 1 tablet by mouth every 6 (six) hours as needed for moderate pain., Disp: 15 tablet, Rfl: 0 .  pantoprazole (PROTONIX) 40 MG tablet, Take 1 tablet (40 mg total) by mouth daily. (Patient not taking: Reported on 07/30/2019), Disp: 30 tablet, Rfl: 1 .  PROAIR HFA 108 (90 Base)  MCG/ACT inhaler, Inhale 1 puff into the lungs every 6 (six) hours as needed for wheezing or shortness of breath. , Disp: , Rfl:  .  SYMBICORT 160-4.5 MCG/ACT inhaler, Inhale 1 puff into the lungs daily as needed (shortness of breath and wheezing). , Disp: , Rfl:  .  Thyroid 97.5 MG TABS, Take 97.5 mg by mouth every morning., Disp: , Rfl:   Past Medical History: Past Medical History:  Diagnosis Date  . Anxiety   . Arthritis   . Asthma   . Cataracts, bilateral    [name]  . Complication of anesthesia    Note: pt has hx of TMJ " every now and then my jaw will lock "  . COPD (chronic obstructive pulmonary disease) (HCC)   . Coronary artery disease    a. s/p DES to mid-RCA in 05/2019  . DM (diabetes mellitus) (HCC)    "came off medicines with dr's permission"  . Family history of adverse reaction to anesthesia    " my son woke up crying "  . Fatty liver   . Fibromyalgia   . GERD (gastroesophageal reflux disease)   . Hyperlipidemia    stopped meds on her own  . Hypothyroid   . Myocardial infarction (HCC)   . On supplemental oxygen therapy    at night and PRN  . Pneumonia   . Shortness  of breath   . Stenosis of right carotid artery   . TMJ (dislocation of temporomandibular joint)     Tobacco Use: Social History   Tobacco Use  Smoking Status Current Every Day Smoker  . Packs/day: 0.50  . Years: 40.00  . Pack years: 20.00  . Types: Cigarettes  Smokeless Tobacco Never Used    Labs: Recent Review Flowsheet Data    Labs for ITP Cardiac and Pulmonary Rehab Latest Ref Rng & Units 07/07/2015 01/05/2016 05/13/2019 05/14/2019 08/01/2019   Cholestrol 0 - 200 mg/dL - - - 161 -   LDLCALC 0 - 99 mg/dL - - - 096(E) -   HDL >45 mg/dL - - - 40(J) -   Trlycerides <150 mg/dL - - - 811(B) -   Hemoglobin A1c 4.8 - 5.6 % 5.5 5.6 5.3 - 5.1      Capillary Blood Glucose: Lab Results  Component Value Date   GLUCAP 107 (H) 08/02/2019   GLUCAP 85 08/02/2019   GLUCAP 94 08/01/2019   GLUCAP 102  (H) 05/18/2019   GLUCAP 105 (H) 01/26/2012     Exercise Target Goals: Exercise Program Goal: Individual exercise prescription set using results from initial 6 min walk test and THRR while considering  patient's activity barriers and safety.   Exercise Prescription Goal: Starting with aerobic activity 30 plus minutes a day, 3 days per week for initial exercise prescription. Provide home exercise prescription and guidelines that participant acknowledges understanding prior to discharge.  Activity Barriers & Risk Stratification: Activity Barriers & Cardiac Risk Stratification - 08/22/19 1400      Activity Barriers & Cardiac Risk Stratification   Activity Barriers  Back Problems   every once in a while strains her back when twisting or moving wrong   Cardiac Risk Stratification  High       6 Minute Walk: 6 Minute Walk    Row Name 08/22/19 1359         6 Minute Walk   Phase  Initial     Distance  1400 feet     Walk Time  6 minutes     # of Rest Breaks  0     MPH  2.65     METS  3.03     RPE  10     Perceived Dyspnea   13     VO2 Peak  11.69     Symptoms  Yes (comment)     Comments  SOB     Resting HR  73 bpm     Resting BP  122/84     Resting Oxygen Saturation   95 %     Exercise Oxygen Saturation  during 6 min walk  95 %     Max Ex. HR  99 bpm     Max Ex. BP  156/82     2 Minute Post BP  136/82        Oxygen Initial Assessment:   Oxygen Re-Evaluation:   Oxygen Discharge (Final Oxygen Re-Evaluation):   Initial Exercise Prescription: Initial Exercise Prescription - 08/22/19 1400      Date of Initial Exercise RX and Referring Provider   Date  08/22/19    Referring Provider  [facility]     Expected Discharge Date  11/22/19      Treadmill   MPH  1.3    Grade  0    Minutes  17    METs  1.99      NuStep   Level  1    SPM  59    Minutes  17    METs  1.8      Recumbant Elliptical   Level  1    RPM  43    Watts  44    Minutes  17    METs  2.5       Prescription Details   Frequency (times per week)  3    Duration  Progress to 30 minutes of continuous aerobic without signs/symptoms of physical distress      Intensity   THRR 40-80% of Max Heartrate  [phone]    Ratings of Perceived Exertion  11-13    Perceived Dyspnea  0-4      Progression   Progression  Continue progressive overload as per policy without signs/symptoms or physical distress.      Resistance Training   Training Prescription  Yes    Weight  1    Reps  10-15       Perform Capillary Blood Glucose checks as needed.  Exercise Prescription Changes:  Exercise Prescription Changes    Row Name 08/22/19 1400             Response to Exercise   Blood Pressure (Admit)  122/84       Blood Pressure (Exercise)  156/82       Blood Pressure (Exit)  136/82       Heart Rate (Admit)  73 bpm       Heart Rate (Exercise)  99 bpm       Heart Rate (Exit)  76 bpm       Oxygen Saturation (Admit)  95 %       Oxygen Saturation (Exercise)  95 %       Oxygen Saturation (Exit)  96 %       Rating of Perceived Exertion (Exercise)  10       Perceived Dyspnea (Exercise)  13       Symptoms  SOB       Comments  6MWT       Duration  Progress to 30 minutes of  aerobic without signs/symptoms of physical distress       Intensity  THRR New 105-122-138         Home Exercise Plan   Plans to continue exercise at  Home (comment) walking        Frequency  Add 2 additional days to program exercise sessions.       Initial Home Exercises Provided  08/22/19          Exercise Comments:   Exercise Goals and Review:  Exercise Goals    Row Name 08/22/19 1404             Exercise Goals   Increase Physical Activity  Yes       Intervention  Provide advice, education, support and counseling about physical activity/exercise needs.;Develop an individualized exercise prescription for aerobic and resistive training based on initial evaluation findings, risk stratification, comorbidities  and participant's personal goals.       Expected Outcomes  Short Term: Attend rehab on a regular basis to increase amount of physical activity.;Long Term: Add in home exercise to make exercise part of routine and to increase amount of physical activity.;Long Term: Exercising regularly at least 3-5 days a week.       Increase Strength and Stamina  Yes       Intervention  Provide advice, education, support and counseling about physical activity/exercise  needs.;Develop an individualized exercise prescription for aerobic and resistive training based on initial evaluation findings, risk stratification, comorbidities and participant's personal goals.       Expected Outcomes  Short Term: Increase workloads from initial exercise prescription for resistance, speed, and METs.;Short Term: Perform resistance training exercises routinely during rehab and add in resistance training at home;Long Term: Improve cardiorespiratory fitness, muscular endurance and strength as measured by increased METs and functional capacity (6MWT)       Able to understand and use rate of perceived exertion (RPE) scale  Yes       Intervention  Provide education and explanation on how to use RPE scale       Expected Outcomes  Short Term: Able to use RPE daily in rehab to express subjective intensity level;Long Term:  Able to use RPE to guide intensity level when exercising independently       Able to understand and use Dyspnea scale  Yes       Intervention  Provide education and explanation on how to use Dyspnea scale       Expected Outcomes  Short Term: Able to use Dyspnea scale daily in rehab to express subjective sense of shortness of breath during exertion;Long Term: Able to use Dyspnea scale to guide intensity level when exercising independently       Knowledge and understanding of Target Heart Rate Range (THRR)  Yes       Intervention  Provide education and explanation of THRR including how the numbers were predicted and where they are  located for reference       Expected Outcomes  Short Term: Able to state/look up THRR;Long Term: Able to use THRR to govern intensity when exercising independently;Short Term: Able to use daily as guideline for intensity in rehab       Able to check pulse independently  Yes       Intervention  Provide education and demonstration on how to check pulse in carotid and radial arteries.;Review the importance of being able to check your own pulse for safety during independent exercise       Expected Outcomes  Short Term: Able to explain why pulse checking is important during independent exercise;Long Term: Able to check pulse independently and accurately       Understanding of Exercise Prescription  Yes       Intervention  Provide education, explanation, and written materials on patient's individual exercise prescription       Expected Outcomes  Short Term: Able to explain program exercise prescription;Long Term: Able to explain home exercise prescription to exercise independently          Exercise Goals Re-Evaluation :    Discharge Exercise Prescription (Final Exercise Prescription Changes): Exercise Prescription Changes - 08/22/19 1400      Response to Exercise   Blood Pressure (Admit)  122/84    Blood Pressure (Exercise)  156/82    Blood Pressure (Exit)  136/82    Heart Rate (Admit)  73 bpm    Heart Rate (Exercise)  99 bpm    Heart Rate (Exit)  76 bpm    Oxygen Saturation (Admit)  95 %    Oxygen Saturation (Exercise)  95 %    Oxygen Saturation (Exit)  96 %    Rating of Perceived Exertion (Exercise)  10    Perceived Dyspnea (Exercise)  13    Symptoms  SOB    Comments  6MWT    Duration  Progress to 30 minutes of  aerobic without signs/symptoms of physical distress    Intensity  THRR New   105-122-138     Home Exercise Plan   Plans to continue exercise at  Home (comment)   walking    Frequency  Add 2 additional days to program exercise sessions.    Initial Home Exercises Provided   08/22/19       Nutrition:  Target Goals: Understanding of nutrition guidelines, daily intake of sodium 1500mg , cholesterol 200mg , calories 30% from fat and 7% or less from saturated fats, daily to have 5 or more servings of fruits and vegetables.  Biometrics: Pre Biometrics - 08/22/19 1405      Pre Biometrics   Height   (1.702 m)    Waist Circumference  39.5 inches    Hip Circumference  41 inches    Waist to Hip Ratio  0.96 %    Triceps Skinfold  23 mm    % Body Fat  39 %    Grip Strength  2.3 kg    Single Leg Stand  30.99 seconds        Nutrition Therapy Plan and Nutrition Goals: Nutrition Therapy & Goals - 08/22/19 1505      Nutrition Therapy   RD appointment deferred  Yes      Personal Nutrition Goals   Personal Goal #2  Patient is working at eating heart healthy. Handouts discussing heart healthy food choices was given.    Additional Goals?  No      Intervention Plan   Intervention  Nutrition handout(s) given to patient.       Nutrition Assessments: Nutrition Assessments - 08/22/19 1506      MEDFICTS Scores   Pre Score  55       Nutrition Goals Re-Evaluation:   Nutrition Goals Discharge (Final Nutrition Goals Re-Evaluation):   Psychosocial: Target Goals: Acknowledge presence or absence of significant depression and/or stress, maximize coping skills, provide positive support system. Participant is able to verbalize types and ability to use techniques and skills needed for reducing stress and depression.  Initial Review & Psychosocial Screening: Initial Psych Review & Screening - 08/22/19 1503      Initial Review   Current issues with  Current Anxiety/Panic      Family Dynamics   Good Support System?  Yes      Barriers   Psychosocial barriers to participate in program  The patient should benefit from training in stress management and relaxation.      Screening Interventions   Interventions  Encouraged to exercise    Expected Outcomes   Short Term goal: Identification and review with participant of any Quality of Life or Depression concerns found by scoring the questionnaire.;Long Term goal: The participant improves quality of Life and PHQ9 Scores as seen by post scores and/or verbalization of changes       Quality of Life Scores: Quality of Life - 08/22/19 1444      Quality of Life   Select  Quality of Life      Quality of Life Scores   Health/Function Pre  20.93 %    Socioeconomic Pre  24.3 %    Psych/Spiritual Pre  23.79 %    Family Pre  21 %    GLOBAL Pre  22.17 %      Scores of 19 and below usually indicate a poorer quality of life in these areas.  A difference of  2-3 points is a clinically meaningful difference.  A difference of  2-3 points in the total score of the Quality of Life Index has been associated with significant improvement in overall quality of life, self-image, physical symptoms, and general health in studies assessing change in quality of life.  PHQ-9: Recent Review Flowsheet Data    Depression screen [facility] 2/9 08/22/2019   Decreased Interest 0   Down, Depressed, Hopeless 0   PHQ - 2 Score 0   Altered sleeping 3   Tired, decreased energy 2   Change in appetite 0   Feeling bad or failure about yourself  1   Trouble concentrating 0   Moving slowly or fidgety/restless 0   Suicidal thoughts 0   PHQ-9 Score 6   Difficult doing work/chores Not difficult at all     Interpretation of Total Score  Total Score Depression Severity:  1-4 = Minimal depression, 5-9 = Mild depression, 10-14 = Moderate depression, 15-19 = Moderately severe depression, 20-27 = Severe depression   Psychosocial Evaluation and Intervention: Psychosocial Evaluation - 08/22/19 1504      Psychosocial Evaluation & Interventions   Interventions  Encouraged to exercise with the program and follow exercise prescription    Continue Psychosocial Services   Follow up required by staff       Psychosocial  Re-Evaluation:   Psychosocial Discharge (Final Psychosocial Re-Evaluation):   Vocational Rehabilitation: Provide vocational rehab assistance to qualifying candidates.   Vocational Rehab Evaluation & Intervention: Vocational Rehab - 08/22/19 1507      Initial Vocational Rehab Evaluation & Intervention   Assessment shows need for Vocational Rehabilitation  No       Education: Education Goals: Education classes will be provided on a weekly basis, covering required topics. Participant will state understanding/return demonstration of topics presented.  Learning Barriers/Preferences: Learning Barriers/Preferences - 08/22/19 1506      Learning Barriers/Preferences   Learning Barriers  None    Learning Preferences  Pictoral;Written Material;Video;Computer/Internet       Education Topics: Hypertension, Hypertension Reduction -Define heart disease and high blood pressure. Discus how high blood pressure affects the body and ways to reduce high blood pressure.   Exercise and Your Heart -Discuss why it is important to exercise, the FITT principles of exercise, normal and abnormal responses to exercise, and how to exercise safely.   Angina -Discuss definition of angina, causes of angina, treatment of angina, and how to decrease risk of having angina.   Cardiac Medications -Review what the following cardiac medications are used for, how they affect the body, and side effects that may occur when taking the medications.  Medications include Aspirin, Beta blockers, calcium channel blockers, ACE Inhibitors, angiotensin receptor blockers, diuretics, digoxin, and antihyperlipidemics.   Congestive Heart Failure -Discuss the definition of CHF, how to live with CHF, the signs and symptoms of CHF, and how keep track of weight and sodium intake.   Heart Disease and Intimacy -Discus the effect sexual activity has on the heart, how changes occur during intimacy as we age, and safety during  sexual activity.   Smoking Cessation / COPD -Discuss different methods to quit smoking, the health benefits of quitting smoking, and the definition of COPD.   Nutrition I: Fats -Discuss the types of cholesterol, what cholesterol does to the heart, and how cholesterol levels can be controlled.   Nutrition II: Labels -Discuss the different components of food labels and how to read food label   Heart Parts/Heart Disease and PAD -Discuss the anatomy of the heart, the pathway of blood circulation through the heart,  and these are affected by heart disease.   Stress I: Signs and Symptoms -Discuss the causes of stress, how stress may lead to anxiety and depression, and ways to limit stress.   Stress II: Relaxation -Discuss different types of relaxation techniques to limit stress.   Warning Signs of Stroke / TIA -Discuss definition of a stroke, what the signs and symptoms are of a stroke, and how to identify when someone is having stroke.   Knowledge Questionnaire Score: Knowledge Questionnaire Score - 08/22/19 1507      Knowledge Questionnaire Score   Pre Score  28/28       Core Components/Risk Factors/Patient Goals at Admission: Personal Goals and Risk Factors at Admission - 08/22/19 1507      Core Components/Risk Factors/Patient Goals on Admission    Weight Management  Weight Maintenance    Personal Goal Other  Yes    Personal Goal  Be in better shape physically, increase stamina, and improve breathing.    Intervention  attend CR program 3 x week and [facility] or home 2 x week.    Expected Outcomes  reach expected goals.       Core Components/Risk Factors/Patient Goals Review:    Core Components/Risk Factors/Patient Goals at Discharge (Final Review):    ITP Comments: ITP Comments    Row Name 08/22/19 1453           ITP Comments  Patient is wanting to quit smoking. she needs to have her Chantix prescription renewed. We will speak to  her heart doctor for him to renew her prescription.          Comments: Patient arrived for 1st visit/orientation/education at 1230. Patient was referred to CR by Dr. Purvis SheffieldKoneswaran due to NSTEMI (I21.4). During orientation advised patient on arrival and appointment times what to wear, what to do before, during and after exercise. Reviewed attendance and class policy. Talked about inclement weather and class consultation policy. Pt is scheduled to return Cardiac Rehab on 08/26/19 at 0930. Pt was advised to come to class 15 minutes before class starts. Patient was also given instructions on meeting with the dietician and attending the Family Structure classes. Discussed RPE/Dpysnea scales. Discussed initial THR and how to find their radial and/or carotid pulse. Discussed the initial exercise prescription and how this effects their progress. Pt is eager to get started. Patient participated in warm up stretches followed by light weights and resistance bands. Patient was able to complete 6 minute walk test. Patient did not c/o pain. Patient was measured for the equipment. Discussed equipment safety with patient. Took patient pre-anthropometric measurements. Patient finished visit at 1430.

## 2019-08-26 ENCOUNTER — Encounter (HOSPITAL_COMMUNITY)
Admission: RE | Admit: 2019-08-26 | Discharge: 2019-08-26 | Disposition: A | Discharge status: Home / Self Care | Payer: Medicare Other | Source: Ambulatory Visit | Attending: Cardiovascular Disease | Admitting: Cardiovascular Disease

## 2019-08-26 ENCOUNTER — Other Ambulatory Visit: Payer: Self-pay

## 2019-08-26 DIAGNOSIS — I214 Non-ST elevation (NSTEMI) myocardial infarction: Secondary | ICD-10-CM

## 2019-08-26 DIAGNOSIS — R002 Palpitations: Secondary | ICD-10-CM

## 2019-08-26 NOTE — Progress Notes (Signed)
Daily Session Note  Patient Details  Name: Tabitha Schmidt MRN: [id number] Date of Birth: [birth year] Referring Provider:     CARDIAC REHAB PHASE II ORIENTATION from 08/22/2019 in [place]  Referring Provider  Bronson Ing       Encounter Date: 08/26/2019  Check In: Session Check In - 08/26/19 0930      Check-In   Supervising physician immediately available to respond to emergencies  See telemetry face sheet for immediately available MD    Location  [facility]    Staff Present  Benay Pike, Exercise Physiologist;Diane Coad, MS, EP, [facility], Exercise Physiologist;Dalayna Lauter Wynetta Emery, RN, BSN    Virtual Visit  No    Medication changes reported      No    Fall or balance concerns reported     No    Tobacco Cessation  No Change    Warm-up and Cool-down  Performed as group-led instruction    Resistance Training Performed  Yes    VAD Patient?  No    PAD/SET Patient?  No      Pain Assessment   Currently in Pain?  No/denies    Pain Score  0-No pain    Multiple Pain Sites  No       Capillary Blood Glucose: No results found for this or any previous visit (from the past 24 hour(s)).    Social History   Tobacco Use  Smoking Status Current Every Day Smoker  . Packs/day: 0.50  . Years: 40.00  . Pack years: 20.00  . Types: Cigarettes  Smokeless Tobacco Never Used    Goals Met:  Independence with exercise equipment Exercise tolerated well No report of cardiac concerns or symptoms Strength training completed today  Goals Unmet:  Not Applicable  Comments: Pt able to follow exercise prescription today without complaint.  Will continue to monitor for progression. Check out 1030.   Dr. Kate Sable is Medical Director for [facility] Cardiac and Pulmonary Rehab.

## 2019-08-28 ENCOUNTER — Other Ambulatory Visit: Payer: Self-pay

## 2019-08-28 ENCOUNTER — Encounter (HOSPITAL_COMMUNITY)
Admission: RE | Admit: 2019-08-28 | Discharge: 2019-08-28 | Disposition: A | Discharge status: Home / Self Care | Payer: Medicare Other | Source: Ambulatory Visit | Attending: Cardiovascular Disease | Admitting: Cardiovascular Disease

## 2019-08-28 DIAGNOSIS — I6523 Occlusion and stenosis of bilateral carotid arteries: Secondary | ICD-10-CM

## 2019-08-28 DIAGNOSIS — I214 Non-ST elevation (NSTEMI) myocardial infarction: Secondary | ICD-10-CM

## 2019-08-28 DIAGNOSIS — R002 Palpitations: Secondary | ICD-10-CM

## 2019-08-28 DIAGNOSIS — I6521 Occlusion and stenosis of right carotid artery: Secondary | ICD-10-CM

## 2019-08-28 NOTE — Progress Notes (Signed)
Daily Session Note  Patient Details  Name: Tabitha Schmidt MRN: [id number] Date of Birth: 03-25-1956 Referring Provider:     CARDIAC REHAB PHASE II ORIENTATION from 08/22/2019 in [place]  Referring Provider  Bronson Ing       Encounter Date: 08/28/2019  Check In: Session Check In - 08/28/19 0930      Check-In   Supervising physician immediately available to respond to emergencies  See telemetry face sheet for immediately available MD    Location  [facility]    Staff Present  Benay Pike, Exercise Physiologist;Diane Coad, MS, EP, [facility], Exercise Physiologist;Fidencio Duddy Wynetta Emery, RN, BSN    Virtual Visit  No    Medication changes reported      No    Fall or balance concerns reported     No    Tobacco Cessation  No Change    Warm-up and Cool-down  Performed as group-led instruction    Resistance Training Performed  Yes    VAD Patient?  No    PAD/SET Patient?  No      Pain Assessment   Currently in Pain?  No/denies    Pain Score  0-No pain    Multiple Pain Sites  No       Capillary Blood Glucose: No results found for this or any previous visit (from the past 24 hour(s)).    Social History   Tobacco Use  Smoking Status Current Every Day Smoker  . Packs/day: 0.50  . Years: 40.00  . Pack years: 20.00  . Types: Cigarettes  Smokeless Tobacco Never Used    Goals Met:  Independence with exercise equipment Exercise tolerated well No report of cardiac concerns or symptoms Strength training completed today  Goals Unmet:  Not Applicable  Comments: Pt able to follow exercise prescription today without complaint.  Will continue to monitor for progression. Check out 1030.   Dr. Kate Sable is Medical Director for [facility] Cardiac and Pulmonary Rehab.

## 2019-08-30 ENCOUNTER — Encounter (HOSPITAL_COMMUNITY)
Admission: RE | Admit: 2019-08-30 | Discharge: 2019-08-30 | Disposition: A | Discharge status: Home / Self Care | Payer: Medicare Other | Source: Ambulatory Visit | Attending: Cardiovascular Disease | Admitting: Cardiovascular Disease

## 2019-08-30 ENCOUNTER — Telehealth (HOSPITAL_COMMUNITY): Payer: Self-pay

## 2019-08-30 ENCOUNTER — Other Ambulatory Visit: Payer: Self-pay

## 2019-08-30 DIAGNOSIS — I214 Non-ST elevation (NSTEMI) myocardial infarction: Secondary | ICD-10-CM

## 2019-08-30 NOTE — Progress Notes (Signed)
Daily Session Note  Patient Details  Name: Tabitha Schmidt MRN: [id number] Date of Birth: Feb 20, 1956 Referring Provider:     CARDIAC REHAB PHASE II ORIENTATION from 08/22/2019 in [place]  Referring Provider  Bronson Ing       Encounter Date: 08/30/2019  Check In: Session Check In - 08/30/19 1514      Check-In   Supervising physician immediately available to respond to emergencies  See telemetry face sheet for immediately available MD    Location  [facility]    Staff Present  Russella Dar, MS, EP, [facility], Exercise Physiologist;Debra Wynetta Emery, RN, BSN    Virtual Visit  No    Medication changes reported      No    Fall or balance concerns reported     No    Tobacco Cessation  No Change    Warm-up and Cool-down  Performed as group-led instruction    Resistance Training Performed  Yes    VAD Patient?  No    PAD/SET Patient?  No      Pain Assessment   Currently in Pain?  No/denies    Pain Score  0-No pain    Multiple Pain Sites  No       Capillary Blood Glucose: No results found for this or any previous visit (from the past 24 hour(s)).    Social History   Tobacco Use  Smoking Status Current Every Day Smoker  . Packs/day: 0.50  . Years: 40.00  . Pack years: 20.00  . Types: Cigarettes  Smokeless Tobacco Never Used    Goals Met:  Independence with exercise equipment Exercise tolerated well Personal goals reviewed No report of cardiac concerns or symptoms Strength training completed today  Goals Unmet:  Not Applicable  Comments: Check out: 69   Dr. Kate Sable is Medical Director for [place] [facility].

## 2019-08-30 NOTE — Telephone Encounter (Signed)

## 2019-09-02 ENCOUNTER — Ambulatory Visit (INDEPENDENT_AMBULATORY_CARE_PROVIDER_SITE_OTHER): Payer: Self-pay | Admitting: Surgery

## 2019-09-02 ENCOUNTER — Ambulatory Visit (HOSPITAL_COMMUNITY)
Admission: RE | Admit: 2019-09-02 | Discharge: 2019-09-02 | Disposition: A | Discharge status: Home / Self Care | Payer: Medicare Other | Source: Ambulatory Visit | Attending: Surgery | Admitting: Surgery

## 2019-09-02 ENCOUNTER — Encounter (HOSPITAL_COMMUNITY)
Admission: RE | Admit: 2019-09-02 | Discharge: 2019-09-02 | Disposition: A | Discharge status: Home / Self Care | Payer: Medicare Other | Source: Ambulatory Visit | Attending: Cardiovascular Disease | Admitting: Cardiovascular Disease

## 2019-09-02 ENCOUNTER — Other Ambulatory Visit: Payer: Self-pay

## 2019-09-02 ENCOUNTER — Encounter: Payer: Self-pay | Admitting: Surgery

## 2019-09-02 VITALS — BP 146/80 | HR 97 | Temp 97.6°F | Resp 20 | Ht 67.0 in | Wt 176.7 lb

## 2019-09-02 DIAGNOSIS — I214 Non-ST elevation (NSTEMI) myocardial infarction: Secondary | ICD-10-CM | POA: Insufficient documentation

## 2019-09-02 DIAGNOSIS — I6523 Occlusion and stenosis of bilateral carotid arteries: Secondary | ICD-10-CM

## 2019-09-02 DIAGNOSIS — R002 Palpitations: Secondary | ICD-10-CM

## 2019-09-02 NOTE — Progress Notes (Signed)
Patient name: Tabitha Schmidt MRN: [id number] DOB: [birth year] Sex: female  REASON FOR VISIT:     post op  HISTORY OF PRESENT ILLNESS:   Tabitha Schmidt is a 63 y.o. female with asymptomatic right carotid stenosis.  She underwent TCAR on October 2 without incident.  She was found to have a 90% lesion.  Her postoperative course was uncomplicated.  She was a high risk candidate because of recent NSTEMI with DES treatment.  She has no complaints today  The patient does take a statin for hypercholesterolemia. She has been diagnosed with diabetes but is not on medications. She is medically managed for hypertension. She is a current smoker. CURRENT MEDICATIONS:    Current Outpatient Medications  Medication Sig Dispense Refill  . aspirin EC 81 MG EC tablet Take 1 tablet (81 mg total) by mouth daily. 30 tablet 3  . atorvastatin (LIPITOR) 80 MG tablet Take 1 tablet (80 mg total) by mouth daily at 6 PM. (Patient taking differently: Take 80 mg by mouth daily. ) 90 tablet 3  . clopidogrel (PLAVIX) 75 MG tablet Take 300 mg ( 4 tablets) the first day, then take 75 mg ( 1 tablet) daily thereafter 94 tablet 3  . cyanocobalamin (,VITAMIN B-12,) 1000 MCG/ML injection Inject 1 mL into the muscle every 30 (thirty) days.    Marland Kitchen LORazepam (ATIVAN) 1 MG tablet Take 1 mg by mouth 2 (two) times daily as needed for anxiety.     . metoprolol tartrate (LOPRESSOR) 25 MG tablet Take 0.5 tablets (12.5 mg total) by mouth 2 (two) times daily. 60 tablet 0  . NALTREXONE HCL PO Take 4.5 mg by mouth every morning.    . NP THYROID 15 MG tablet Take 15 mg by mouth daily.    . NP THYROID 90 MG tablet Take 1 tablet by mouth daily before breakfast.    . PROAIR HFA 108 (90 Base) MCG/ACT inhaler Inhale 1 puff into the lungs every 6 (six) hours as needed for wheezing or shortness of breath.     . SYMBICORT 160-4.5 MCG/ACT inhaler Inhale 1 puff into the lungs daily as needed (shortness of breath and  wheezing).      No current facility-administered medications for this visit.     REVIEW OF SYSTEMS:   [X]  denotes positive finding, [ ]  denotes negative finding Cardiac  Comments:  Chest pain or chest pressure:    Shortness of breath upon exertion:    Short of breath when lying flat:    Irregular heart rhythm:    Constitutional    Fever or chills:      PHYSICAL EXAM:   Vitals:   09/02/19 1443 09/02/19 1446  BP: (!) 143/82 (!) 146/80  Pulse: 97   Resp: 20   Temp: 97.6 F (36.4 C)   SpO2: 95%   Weight: 176 lb 11.2 oz (80.2 kg)   Height: 5\' 7"  (1.702 m)     GENERAL: The patient is a well-nourished female, in no acute distress. The vital signs are documented above. CARDIOVASCULAR: There is a regular rate and rhythm. PULMONARY: Non-labored respirations Neck incision has healed.  She is neurologically intact  STUDIES:   Right Carotid: Velocities in the right ICA are consistent with a 1-39% stenosis.                The ECA appears >50% stenosed.  Left Carotid: Velocities in the left ICA are consistent with a 40-59% stenosis.  The ECA appears >50% stenosed.  Vertebrals:  Bilateral vertebral arteries demonstrate antegrade flow. Subclavians: Normal flow hemodynamics were seen in bilateral subclavian              arteries.    MEDICAL ISSUES:   Status post right TCAR: Carotid duplex shows a widely patent stent.  She is asymptomatic.  Her incisions have healed nicely.  She will continue her aspirin Plavix and statin.  She will follow-up in 6 months with repeat carotid duplex.  Charlena Cross, MD, FACS [facility] [phone] Pager [phone]

## 2019-09-02 NOTE — Progress Notes (Signed)
Daily Session Note  Patient Details  Name: Tabitha Schmidt MRN: [id number] Date of Birth: 04/06/1956 Referring Provider:     CARDIAC REHAB PHASE II ORIENTATION from 08/22/2019 in [place] [facility]  Referring Provider  Koneswaran       Encounter Date: 09/02/2019  Check In:   Capillary Blood Glucose: No results found for this or any previous visit (from the past 24 hour(s)).    Social History   Tobacco Use  Smoking Status Current Every Day Smoker  . Packs/day: 0.50  . Years: 40.00  . Pack years: 20.00  . Types: Cigarettes  Smokeless Tobacco Never Used    Goals Met:  Independence with exercise equipment Exercise tolerated well Personal goals reviewed No report of cardiac concerns or symptoms Strength training completed today  Goals Unmet:  Not Applicable  Comments: Check out at 10:30am.   Dr. Suresh Koneswaran is Medical Director for [facility]. 

## 2019-09-04 ENCOUNTER — Encounter (HOSPITAL_COMMUNITY): Payer: Medicare Other

## 2019-09-06 ENCOUNTER — Encounter (HOSPITAL_COMMUNITY): Payer: Medicare Other

## 2019-09-09 ENCOUNTER — Encounter (HOSPITAL_COMMUNITY): Payer: Medicare Other

## 2019-09-10 ENCOUNTER — Telehealth: Payer: Self-pay

## 2019-09-10 ENCOUNTER — Other Ambulatory Visit: Payer: Self-pay | Admitting: Student

## 2019-09-10 MED ORDER — METOPROLOL TARTRATE 25 MG PO TABS: 12.5000 mg | ORAL_TABLET | Freq: Two times a day (BID) | ORAL | 6 refills | Status: DC

## 2019-09-10 NOTE — Telephone Encounter (Signed)
New message     *STAT* If patient is at the pharmacy, call can be transferred to refill team.   1. Which medications need to be refilled? (please list name of each medication and dose if known)  metoprolol tartrate (LOPRESSOR) 25 MG tablet Take 0.5 tablets (12.5 mg total) by mouth 2 (two) times daily.   protonix 40 mg 1 daily - was given at the hospital and patient wants to continue taking this   2. Which pharmacy/location (including street and city if local pharmacy) is medication to be sent to? [facility]   3. Do they need a 30 day or 90 day supply? [place]

## 2019-09-11 ENCOUNTER — Encounter (HOSPITAL_COMMUNITY): Payer: Medicare Other

## 2019-09-11 NOTE — Progress Notes (Signed)
Discharge Progress Report  Patient Details  Name: Tabitha Schmidt MRN: [id number] Date of Birth: 05/14/1956 Referring Provider:     CARDIAC REHAB PHASE II ORIENTATION from 08/22/2019 in [place] [facility]  Referring Provider  Koneswaran        Number of Visits: 5  Reason for Discharge:  Early Exit:  Patient started a new treatment for hypothyroidism and she did not feel up to completing the program at this time.   Smoking History:  Social History   Tobacco Use  Smoking Status Current Every Day Smoker  . Packs/day: 0.50  . Years: 40.00  . Pack years: 20.00  . Types: Cigarettes  Smokeless Tobacco Never Used    Diagnosis:  NSTEMI (non-ST elevated myocardial infarction) (HCC)  Palpitation  ADL UCSD:   Initial Exercise Prescription: Initial Exercise Prescription - 08/22/19 1400      Date of Initial Exercise RX and Referring Provider   Date  08/22/19    Referring Provider  Koneswaran     Expected Discharge Date  11/22/19      Treadmill   MPH  1.3    Grade  0    Minutes  17    METs  1.99      NuStep   Level  1    SPM  59    Minutes  17    METs  1.8      Recumbant Elliptical   Level  1    RPM  43    Watts  44    Minutes  17    METs  2.5      Prescription Details   Frequency (times per week)  3    Duration  Progress to 30 minutes of continuous aerobic without signs/symptoms of physical distress      Intensity   THRR 40-80% of Max Heartrate  105-122-138    Ratings of Perceived Exertion  11-13    Perceived Dyspnea  0-4      Progression   Progression  Continue progressive overload as per policy without signs/symptoms or physical distress.      Resistance Training   Training Prescription  Yes    Weight  1    Reps  10-15       Discharge Exercise Prescription (Final Exercise Prescription Changes): Exercise Prescription Changes - 08/22/19 1400      Response to Exercise   Blood Pressure (Admit)  122/84    Blood Pressure (Exercise)   156/82    Blood Pressure (Exit)  136/82    Heart Rate (Admit)  73 bpm    Heart Rate (Exercise)  99 bpm    Heart Rate (Exit)  76 bpm    Oxygen Saturation (Admit)  95 %    Oxygen Saturation (Exercise)  95 %    Oxygen Saturation (Exit)  96 %    Rating of Perceived Exertion (Exercise)  10    Perceived Dyspnea (Exercise)  13    Symptoms  SOB    Comments  <MEASUREMESFirsthealth Richmond Memorial Hospitalt.(7842[facility] CeSherril Croont<MEASUREAlbany Medical Center - South Clinical CampusE5842Oregon Eye Surgery CenterSherril CroonI<MEASURKadlec Regional Medical CenterEM(6042[facility] JoSherril Croonl<MEASUREMEUhhs Memorial Hospital Of GenevaNT7742Alamarcon HoldingSherril CroonL<MEASURESurgery Center Of Northern [facility] Dba Eye Center Of [facility] Surgery CenterEN42Niobrara Health And Life CeSherril Croont<MEASUREMENAlameda HospitalTS42James [facility] Medical CeSherril Croont<MEASURESt Luke'S Hospital Anderson CampusEN7842Uw Medicine Valley Medical CeSherril Croont<MEASUREMENTNorth AcomiCordova Community Medical Centerta42Liberty Eye Surgical CenterSherril CroonL<MEASUREMENTHosp Psiquiatrico CorreccionalHo4442Mercy Hospital BerryvSherril Croonl<MEASUREMEBoston Medical Center - Menino CampusNT2142Johnston Medical Center - SmithfSherril Croone<MEASURELowcountry Outpatient Surgery Center LLCE5542Knoxville Orthopaedic Surgery CenterSherril CroonL<MEASUREMENAdvanced Specialty Hospital Of ToledoTE(5042W[facility] Dba Con[facility] Health Moses LakeSherril CroonA<MEASUREMENTMarGrace Hospitalio8542Patient’S Choice Medical Center Of Humphreys CoSherril Croonn<MEASUREUrology Surgery Center Of Savannah LlLPE(91542M[facility] MichSherril Croong<MEASUREMENTAvon-Providence Medical Centerby(50842[facility] Tambi Mc - San PSherril Croond<MEASURETexas Health Presbyterian Hospital RockwallEN8242Centerstone Of FloSherril Crooni<MEASUREMENTCapital Health Medical Center - HopewellHo42Depoo HospSherril Croont<MEASUREBaylor Scott White Surgicare PlanoEN4842Color[facility] Plains Medical CeSherril Croont<MEASUREMENTNortheastern Nevada Regional HospitalBl42Va Medical Center - CheySherril Croonn<MEASUREOtay Lakes Surgery Center LLCEN42Healthcare Enterprises LLC Dba The Surgery CeSherril Croont<MEASUREMEAstra Sunnyside Community HospitalNT42Texas Health Presbyterian Hospital KauSherril Croonm<MEASURENorthwest Community HospitalE9142St. Francis Medical CeSherril Croont<MEASURESurgery Center Of Pottsville LPE(2742Tallgrass Surgical CenterSherril CroonL<MEASUREMENTThLimestone Medical Centerre(83242May[facility] Methodist CaSherril Croonp<MEASUREMEMount Sinai HospitalN42Hosp Psiquiatrico Dr Ramon Fernandez MaSherril Cro<MEASUREBristol Regional Medical CenterEN(9542Piedmont Fayette HospSherril CroontYMalissa Hippol  Progress to 30 minutes of  aerobic without signs/symptoms of physical distress    Intensity  THRR New   105-122-138     Home Exercise Plan   Plans to continue exercise at  Home (comment)   walking    Frequency  Add 2 additional days to program exercise sessions.    Initial Home Exercises Provided  08/22/19       Functional Capacity: 6 Minute Walk  Row Name 08/22/19 1359         6 Minute Walk   Phase  Initial     Distance  1400 feet     Walk Time  6 minutes     # of Rest Breaks  0     MPH  2.65     METS  3.03     RPE  10     Perceived Dyspnea   13     VO2 Peak  11.69     Symptoms  Yes (comment)     Comments  SOB     Resting HR  73 bpm     Resting BP  122/84     Resting Oxygen Saturation   95 %     Exercise Oxygen Saturation  during 6 min walk  95 %     Max Ex. HR  99 bpm     Max Ex. BP  156/82     2 Minute Post BP  136/82        Psychological, QOL, Others - Outcomes: PHQ 2/9: Depression screen PHQ 2/9 08/22/2019  Decreased Interest 0  Down, Depressed, Hopeless 0  PHQ - 2 Score 0  Altered sleeping 3  Tired, decreased energy 2  Change in appetite 0  Feeling bad or failure about yourself  1  Trouble concentrating 0  Moving slowly or fidgety/restless 0  Suicidal thoughts 0  PHQ-9 Score 6  Difficult doing work/chores Not difficult at all    Quality of Life: Quality of Life - 08/22/19 1444      Quality of Life   Select  Quality of Life      Quality of Life Scores   Health/Function Pre  20.93 %    Socioeconomic Pre  24.3 %    Psych/Spiritual Pre  23.79 %    Family Pre  21 %     GLOBAL Pre  22.17 %       Personal Goals: Goals established at orientation with interventions provided to work toward goal. Personal Goals and Risk Factors at Admission - 08/22/19 1507      Core Components/Risk Factors/Patient Goals on Admission    Weight Management  Weight Maintenance    Personal Goal Other  Yes    Personal Goal  Be in better shape physically, increase stamina, and improve breathing.    Intervention  attend CR program 3 x week and supplement with exercise at the [facility] or home 2 x week.    Expected Outcomes  reach expected goals.        Personal Goals Discharge:   Exercise Goals and Review: Exercise Goals    Row Name 08/22/19 1404             Exercise Goals   Increase Physical Activity  Yes       Intervention  Provide advice, education, support and counseling about physical activity/exercise needs.;Develop an individualized exercise prescription for aerobic and resistive training based on initial evaluation findings, risk stratification, comorbidities and participant's personal goals.       Expected Outcomes  Short Term: Attend rehab on a regular basis to increase amount of physical activity.;Long Term: Add in home exercise to make exercise part of routine and to increase amount of physical activity.;Long Term: Exercising regularly at least 3-5 days a week.       Increase Strength and Stamina  Yes       Intervention  Provide advice, education, support and counseling about physical activity/exercise needs.;Develop  an individualized exercise prescription for aerobic and resistive training based on initial evaluation findings, risk stratification, comorbidities and participant's personal goals.       Expected Outcomes  Short Term: Increase workloads from initial exercise prescription for resistance, speed, and METs.;Short Term: Perform resistance training exercises routinely during rehab and add in resistance training at home;Long Term: Improve cardiorespiratory  fitness, muscular endurance and strength as measured by increased METs and functional capacity ( )       Able to understand and use rate of perceived exertion (RPE) scale  Yes       Intervention  Provide education and explanation on how to use RPE scale       Expected Outcomes  Short Term: Able to use RPE daily in rehab to express subjective intensity level;Long Term:  Able to use RPE to guide intensity level when exercising independently       Able to understand and use Dyspnea scale  Yes       Intervention  Provide education and explanation on how to use Dyspnea scale       Expected Outcomes  Short Term: Able to use Dyspnea scale daily in rehab to express subjective sense of shortness of breath during exertion;Long Term: Able to use Dyspnea scale to guide intensity level when exercising independently       Knowledge and understanding of Target Heart Rate Range (THRR)  Yes       Intervention  Provide education and explanation of THRR including how the numbers were predicted and where they are located for reference       Expected Outcomes  Short Term: Able to state/look up THRR;Long Term: Able to use THRR to govern intensity when exercising independently;Short Term: Able to use daily as guideline for intensity in rehab       Able to check pulse independently  Yes       Intervention  Provide education and demonstration on how to check pulse in carotid and radial arteries.;Review the importance of being able to check your own pulse for safety during independent exercise       Expected Outcomes  Short Term: Able to explain why pulse checking is important during independent exercise;Long Term: Able to check pulse independently and accurately       Understanding of Exercise Prescription  Yes       Intervention  Provide education, explanation, and written materials on patient's individual exercise prescription       Expected Outcomes  Short Term: Able to explain program exercise prescription;Long Term:  Able to explain home exercise prescription to exercise independently          Exercise Goals Re-Evaluation:   Nutrition & Weight - Outcomes: Pre Biometrics - 08/22/19 1405      Pre Biometrics   Height  5\' 7"  (1.702 m)    Waist Circumference  39.5 inches    Hip Circumference  41 inches    Waist to Hip Ratio  0.96 %    Triceps Skinfold  23 mm    % Body Fat  39 %    Grip Strength  2.3 kg    Single Leg Stand  30.99 seconds        Nutrition: Nutrition Therapy & Goals - 08/22/19 1505      Nutrition Therapy   RD appointment deferred  Yes      Personal Nutrition Goals   Personal Goal #2  Patient is working at eating heart healthy. Handouts discussing heart healthy food choices  was given.    Additional Goals?  No      Intervention Plan   Intervention  Nutrition handout(s) given to patient.       Nutrition Discharge: Nutrition Assessments - 08/22/19 1506      MEDFICTS Scores   Pre Score  55       Education Questionnaire Score: Knowledge Questionnaire Score - 08/22/19 1507      Knowledge Questionnaire Score   Pre Score  28/28

## 2019-09-11 NOTE — Progress Notes (Signed)
Cardiac Individual Treatment Plan  Patient Details  Name: Tabitha Schmidt MRN: [id number] Date of Birth: May 28, 1956 Referring Provider:     CARDIAC REHAB PHASE II ORIENTATION from 08/22/2019 in [facility] CARDIAC REHABILITATION  Referring Provider  Tabitha Schmidt       Initial Encounter Date:    CARDIAC REHAB PHASE II ORIENTATION from 08/22/2019 in [facility] CARDIAC REHABILITATION  Date  08/22/19      Visit Diagnosis: NSTEMI (non-ST elevated myocardial infarction) (HCC)  Palpitation  Patient's Home Medications on Admission:  Current Outpatient Medications:  .  aspirin EC 81 MG EC tablet, Take 1 tablet (81 mg total) by mouth daily., Disp: 30 tablet, Rfl: 3 .  atorvastatin (LIPITOR) 80 MG tablet, Take 1 tablet (80 mg total) by mouth daily at 6 PM. (Patient taking differently: Take 80 mg by mouth daily. ), Disp: 90 tablet, Rfl: 3 .  clopidogrel (PLAVIX) 75 MG tablet, Take 300 mg ( 4 tablets) the first day, then take 75 mg ( 1 tablet) daily thereafter, Disp: 94 tablet, Rfl: 3 .  cyanocobalamin (,VITAMIN B-12,) 1000 MCG/ML injection, Inject 1 mL into the muscle every 30 (thirty) days., Disp: , Rfl:  .  LORazepam (ATIVAN) 1 MG tablet, Take 1 mg by mouth 2 (two) times daily as needed for anxiety. , Disp: , Rfl:  .  metoprolol tartrate (LOPRESSOR) 25 MG tablet, Take 0.5 tablets (12.5 mg total) by mouth 2 (two) times daily., Disp: 30 tablet, Rfl: 6 .  NALTREXONE HCL PO, Take 4.5 mg by mouth every morning., Disp: , Rfl:  .  NP THYROID 15 MG tablet, Take 15 mg by mouth daily., Disp: , Rfl:  .  NP THYROID 90 MG tablet, Take 1 tablet by mouth daily before breakfast., Disp: , Rfl:  .  PROAIR HFA 108 (90 Base) MCG/ACT inhaler, Inhale 1 puff into the lungs every 6 (six) hours as needed for wheezing or shortness of breath. , Disp: , Rfl:  .  SYMBICORT 160-4.5 MCG/ACT inhaler, Inhale 1 puff into the lungs daily as needed (shortness of breath and wheezing). , Disp: , Rfl:   Past Medical History: Past  Medical History:  Diagnosis Date  . Anxiety   . Arthritis   . Asthma   . Cataracts, bilateral    Tabitha Schmidt  . Complication of anesthesia    Note: pt has hx of TMJ " every now and then my jaw will lock "  . COPD (chronic obstructive pulmonary disease) (HCC)   . Coronary artery disease    a. s/p DES to mid-RCA in 05/2019  . DM (diabetes mellitus) (HCC)    "came off medicines with dr's permission"  . Family history of adverse reaction to anesthesia    " my son woke up crying "  . Fatty liver   . Fibromyalgia   . GERD (gastroesophageal reflux disease)   . Hyperlipidemia    stopped meds on her own  . Hypothyroid   . Myocardial infarction (HCC)   . On supplemental oxygen therapy    at night and PRN  . Pneumonia   . Shortness of breath   . Stenosis of right carotid artery   . TMJ (dislocation of temporomandibular joint)     Tobacco Use: Social History   Tobacco Use  Smoking Status Current Every Day Smoker  . Packs/day: 0.50  . Years: 40.00  . Pack years: 20.00  . Types: Cigarettes  Smokeless Tobacco Never Used    Labs: Recent Review Flowsheet Data  Labs for ITP Cardiac and Pulmonary Rehab Latest Ref Rng & Units 07/07/2015 01/05/2016 05/13/2019 05/14/2019 08/01/2019   Cholestrol 0 - 200 mg/dL - - - 948 -   LDLCALC 0 - 99 mg/dL - - - 016(P) -   HDL >53 mg/dL - - - 74(M) -   Trlycerides <150 mg/dL - - - 270(B) -   Hemoglobin A1c 4.8 - 5.6 % 5.5 5.6 5.3 - 5.1      Capillary Blood Glucose: Lab Results  Component Value Date   GLUCAP 107 (H) 08/02/2019   GLUCAP 85 08/02/2019   GLUCAP 94 08/01/2019   GLUCAP 102 (H) 05/18/2019   GLUCAP 105 (H) 01/26/2012     Exercise Target Goals: Exercise Program Goal: Individual exercise prescription set using results from initial 6 min walk test and THRR while considering  patient's activity barriers and safety.   Exercise Prescription Goal: Starting with aerobic activity 30 plus minutes a day, 3 days per week for initial exercise  prescription. Provide home exercise prescription and guidelines that participant acknowledges understanding prior to discharge.  Activity Barriers & Risk Stratification: Activity Barriers & Cardiac Risk Stratification - 08/22/19 1400      Activity Barriers & Cardiac Risk Stratification   Activity Barriers  Back Problems   every once in a while strains her back when twisting or moving wrong   Cardiac Risk Stratification  High       6 Minute Walk: 6 Minute Walk    Row Name 08/22/19 1359         6 Minute Walk   Phase  Initial     Distance  1400 feet     Walk Time  6 minutes     # of Rest Breaks  0     MPH  2.65     METS  3.03     RPE  10     Perceived Dyspnea   13     VO2 Peak  11.69     Symptoms  Yes (comment)     Comments  SOB     Resting HR  73 bpm     Resting BP  122/84     Resting Oxygen Saturation   95 %     Exercise Oxygen Saturation  during 6 min walk  95 %     Max Ex. HR  99 bpm     Max Ex. BP  156/82     2 Minute Post BP  136/82        Oxygen Initial Assessment:   Oxygen Re-Evaluation:   Oxygen Discharge (Final Oxygen Re-Evaluation):   Initial Exercise Prescription: Initial Exercise Prescription - 08/22/19 1400      Date of Initial Exercise RX and Referring Provider   Date  08/22/19    Referring Provider  [facility]     Expected Discharge Date  11/22/19      Treadmill   MPH  1.3    Grade  0    Minutes  17    METs  1.99      NuStep   Level  1    SPM  59    Minutes  17    METs  1.8      Recumbant Elliptical   Level  1    RPM  43    Watts  44    Minutes  17    METs  2.5      Prescription Details   Frequency (times per week)  3    Duration  Progress to 30 minutes of continuous aerobic without signs/symptoms of physical distress      Intensity   THRR 40-80% of Max Heartrate  [phone]    Ratings of Perceived Exertion  11-13    Perceived Dyspnea  0-4      Progression   Progression  Continue progressive overload as per policy  without signs/symptoms or physical distress.      Resistance Training   Training Prescription  Yes    Weight  1    Reps  10-15       Perform Capillary Blood Glucose checks as needed.  Exercise Prescription Changes:  Exercise Prescription Changes    Row Name 08/22/19 1400             Response to Exercise   Blood Pressure (Admit)  122/84       Blood Pressure (Exercise)  156/82       Blood Pressure (Exit)  136/82       Heart Rate (Admit)  73 bpm       Heart Rate (Exercise)  99 bpm       Heart Rate (Exit)  76 bpm       Oxygen Saturation (Admit)  95 %       Oxygen Saturation (Exercise)  95 %       Oxygen Saturation (Exit)  96 %       Rating of Perceived Exertion (Exercise)  10       Perceived Dyspnea (Exercise)  13       Symptoms  SOB       Comments        Duration  Progress to 30 minutes of  aerobic without signs/symptoms of physical distress       Intensity  THRR New [phone]         Home Exercise Plan   Plans to continue exercise at  Home (comment) walking        Frequency  Add 2 additional days to program exercise sessions.       Initial Home Exercises Provided  08/22/19          Exercise Comments:   Exercise Goals and Review:  Exercise Goals    Row Name 08/22/19 1404             Exercise Goals   Increase Physical Activity  Yes       Intervention  Provide advice, education, support and counseling about physical activity/exercise needs.;Develop an individualized exercise prescription for aerobic and resistive training based on initial evaluation findings, risk stratification, comorbidities and participant's personal goals.       Expected Outcomes  Short Term: Attend rehab on a regular basis to increase amount of physical activity.;Long Term: Add in home exercise to make exercise part of routine and to increase amount of physical activity.;Long Term: Exercising regularly at least 3-5 days a week.       Increase Strength and Stamina  Yes        Intervention  Provide advice, education, support and counseling about physical activity/exercise needs.;Develop an individualized exercise prescription for aerobic and resistive training based on initial evaluation findings, risk stratification, comorbidities and participant's personal goals.       Expected Outcomes  Short Term: Increase workloads from initial exercise prescription for resistance, speed, and METs.;Short Term: Perform resistance training exercises routinely during rehab and add in resistance training at home;Long Term: Improve cardiorespiratory fitness, muscular endurance and strength as measured  by increased METs and functional capacity (6MWT)       Able to understand and use rate of perceived exertion (RPE) scale  Yes       Intervention  Provide education and explanation on how to use RPE scale       Expected Outcomes  Short Term: Able to use RPE daily in rehab to express subjective intensity level;Long Term:  Able to use RPE to guide intensity level when exercising independently       Able to understand and use Dyspnea scale  Yes       Intervention  Provide education and explanation on how to use Dyspnea scale       Expected Outcomes  Short Term: Able to use Dyspnea scale daily in rehab to express subjective sense of shortness of breath during exertion;Long Term: Able to use Dyspnea scale to guide intensity level when exercising independently       Knowledge and understanding of Target Heart Rate Range (THRR)  Yes       Intervention  Provide education and explanation of THRR including how the numbers were predicted and where they are located for reference       Expected Outcomes  Short Term: Able to state/look up THRR;Long Term: Able to use THRR to govern intensity when exercising independently;Short Term: Able to use daily as guideline for intensity in rehab       Able to check pulse independently  Yes       Intervention  Provide education and demonstration on how to check pulse in  carotid and radial arteries.;Review the importance of being able to check your own pulse for safety during independent exercise       Expected Outcomes  Short Term: Able to explain why pulse checking is important during independent exercise;Long Term: Able to check pulse independently and accurately       Understanding of Exercise Prescription  Yes       Intervention  Provide education, explanation, and written materials on patient's individual exercise prescription       Expected Outcomes  Short Term: Able to explain program exercise prescription;Long Term: Able to explain home exercise prescription to exercise independently          Exercise Goals Re-Evaluation :    Discharge Exercise Prescription (Final Exercise Prescription Changes): Exercise Prescription Changes - 08/22/19 1400      Response to Exercise   Blood Pressure (Admit)  122/84    Blood Pressure (Exercise)  156/82    Blood Pressure (Exit)  136/82    Heart Rate (Admit)  73 bpm    Heart Rate (Exercise)  99 bpm    Heart Rate (Exit)  76 bpm    Oxygen Saturation (Admit)  95 %    Oxygen Saturation (Exercise)  95 %    Oxygen Saturation (Exit)  96 %    Rating of Perceived Exertion (Exercise)  10    Perceived Dyspnea (Exercise)  13    Symptoms  SOB    Comments  6MWT    Duration  Progress to 30 minutes of  aerobic without signs/symptoms of physical distress    Intensity  THRR New   [id number]     Home Exercise Plan   Plans to continue exercise at  Home (comment)   walking    Frequency  Add 2 additional days to program exercise sessions.    Initial Home Exercises Provided  08/22/19       Nutrition:  Target Goals: Understanding  of nutrition guidelines, daily intake of sodium 1500mg , cholesterol 200mg , calories 30% from fat and 7% or less from saturated fats, daily to have 5 or more servings of fruits and vegetables.  Biometrics: Pre Biometrics - 08/22/19 1405      Pre Biometrics   Height  5\' 7"  (1.702 m)    Waist  Circumference  39.5 inches    Hip Circumference  41 inches    Waist to Hip Ratio  0.96 %    Triceps Skinfold  23 mm    % Body Fat  39 %    Grip Strength  2.3 kg    Single Leg Stand  30.99 seconds        Nutrition Therapy Plan and Nutrition Goals: Nutrition Therapy & Goals - 08/22/19 1505      Nutrition Therapy   RD appointment deferred  Yes      Personal Nutrition Goals   Personal Goal #2  Patient is working at eating heart healthy. Handouts discussing heart healthy food choices was given.    Additional Goals?  No      Intervention Plan   Intervention  Nutrition handout(s) given to patient.       Nutrition Assessments: Nutrition Assessments - 08/22/19 1506      MEDFICTS Scores   Pre Score  55       Nutrition Goals Re-Evaluation:   Nutrition Goals Discharge (Final Nutrition Goals Re-Evaluation):   Psychosocial: Target Goals: Acknowledge presence or absence of significant depression and/or stress, maximize coping skills, provide positive support system. Participant is able to verbalize types and ability to use techniques and skills needed for reducing stress and depression.  Initial Review & Psychosocial Screening: Initial Psych Review & Screening - 08/22/19 1503      Initial Review   Current issues with  Current Anxiety/Panic      Family Dynamics   Good Support System?  Yes      Barriers   Psychosocial barriers to participate in program  The patient should benefit from training in stress management and relaxation.      Screening Interventions   Interventions  Encouraged to exercise    Expected Outcomes  Short Term goal: Identification and review with participant of any Quality of Life or Depression concerns found by scoring the questionnaire.;Long Term goal: The participant improves quality of Life and PHQ9 Scores as seen by post scores and/or verbalization of changes       Quality of Life Scores: Quality of Life - 08/22/19 1444      Quality of Life    Select  Quality of Life      Quality of Life Scores   Health/Function Pre  20.93 %    Socioeconomic Pre  24.3 %    Psych/Spiritual Pre  23.79 %    Family Pre  21 %    GLOBAL Pre  22.17 %      Scores of 19 and below usually indicate a poorer quality of life in these areas.  A difference of  2-3 points is a clinically meaningful difference.  A difference of 2-3 points in the total score of the Quality of Life Index has been associated with significant improvement in overall quality of life, self-image, physical symptoms, and general health in studies assessing change in quality of life.  PHQ-9: Recent Review Flowsheet Data    Depression screen [facility] 2/9 08/22/2019   Decreased Interest 0   Down, Depressed, Hopeless 0   PHQ - 2 Score 0  Altered sleeping 3   Tired, decreased energy 2   Change in appetite 0   Feeling bad or failure about yourself  1   Trouble concentrating 0   Moving slowly or fidgety/restless 0   Suicidal thoughts 0   PHQ-9 Score 6   Difficult doing work/chores Not difficult at all     Interpretation of Total Score  Total Score Depression Severity:  1-4 = Minimal depression, 5-9 = Mild depression, 10-14 = Moderate depression, 15-19 = Moderately severe depression, 20-27 = Severe depression   Psychosocial Evaluation and Intervention: Psychosocial Evaluation - 08/22/19 1504      Psychosocial Evaluation & Interventions   Interventions  Encouraged to exercise with the program and follow exercise prescription    Continue Psychosocial Services   Follow up required by staff       Psychosocial Re-Evaluation:   Psychosocial Discharge (Final Psychosocial Re-Evaluation):   Vocational Rehabilitation: Provide vocational rehab assistance to qualifying candidates.   Vocational Rehab Evaluation & Intervention: Vocational Rehab - 08/22/19 1507      Initial Vocational Rehab Evaluation & Intervention   Assessment shows need for Vocational Rehabilitation  No        Education: Education Goals: Education classes will be provided on a weekly basis, covering required topics. Participant will state understanding/return demonstration of topics presented.  Learning Barriers/Preferences: Learning Barriers/Preferences - 08/22/19 1506      Learning Barriers/Preferences   Learning Barriers  None    Learning Preferences  Pictoral;Written Material;Video;Computer/Internet       Education Topics: Hypertension, Hypertension Reduction -Define heart disease and high blood pressure. Discus how high blood pressure affects the body and ways to reduce high blood pressure.   Exercise and Your Heart -Discuss why it is important to exercise, the FITT principles of exercise, normal and abnormal responses to exercise, and how to exercise safely.   Angina -Discuss definition of angina, causes of angina, treatment of angina, and how to decrease risk of having angina.   Cardiac Medications -Review what the following cardiac medications are used for, how they affect the body, and side effects that may occur when taking the medications.  Medications include Aspirin, Beta blockers, calcium channel blockers, ACE Inhibitors, angiotensin receptor blockers, diuretics, digoxin, and antihyperlipidemics.   CARDIAC REHAB PHASE II EXERCISE from 08/28/2019 in [facility]  Date  08/28/19  Educator  Timoteo Ex[employer]. Elisabet Gutzmer  Instruction Review Code  2- Demonstrated Understanding      Congestive Heart Failure -Discuss the definition of CHF, how to live with CHF, the signs and symptoms of CHF, and how keep track of weight and sodium intake.   Heart Disease and Intimacy -Discus the effect sexual activity has on the heart, how changes occur during intimacy as we age, and safety during sexual activity.   Smoking Cessation / COPD -Discuss different methods to quit smoking, the health benefits of quitting smoking, and the definition of COPD.   Nutrition I:  Fats -Discuss the types of cholesterol, what cholesterol does to the heart, and how cholesterol levels can be controlled.   Nutrition II: Labels -Discuss the different components of food labels and how to read food label   Heart Parts/Heart Disease and PAD -Discuss the anatomy of the heart, the pathway of blood circulation through the heart, and these are affected by heart disease.   Stress I: Signs and Symptoms -Discuss the causes of stress, how stress may lead to anxiety and depression, and ways to limit stress.   Stress II: Relaxation -  Discuss different types of relaxation techniques to limit stress.   Warning Signs of Stroke / TIA -Discuss definition of a stroke, what the signs and symptoms are of a stroke, and how to identify when someone is having stroke.   Knowledge Questionnaire Score: Knowledge Questionnaire Score - 08/22/19 1507      Knowledge Questionnaire Score   Pre Score  28/28       Core Components/Risk Factors/Patient Goals at Admission: Personal Goals and Risk Factors at Admission - 08/22/19 1507      Core Components/Risk Factors/Patient Goals on Admission    Weight Management  Weight Maintenance    Personal Goal Other  Yes    Personal Goal  Be in better shape physically, increase stamina, and improve breathing.    Intervention  attend CR program 3 x week and supplement with exercise at the [facility] or home 2 x week.    Expected Outcomes  reach expected goals.       Core Components/Risk Factors/Patient Goals Review:    Core Components/Risk Factors/Patient Goals at Discharge (Final Review):    ITP Comments: ITP Comments    Row Name 08/22/19 1453 09/11/19 1143         ITP Comments  Patient is wanting to quit smoking. she needs to have her Chantix prescription renewed. We will speak to her heart doctor for him to renew her prescription.  Patient stopped attending the program.  She completed 5 sessions. She started a new treatment for hypothyroidism  and she said she doesn't feel like she can complete the program. MD will be notified.         Comments: Patient stopped coming to Cardiac Rehab on 09/02/19. She completed 5 sessions. She said she started a new treatment for hypothyroidism and she did not feel like she could complete the program at this time. Doctor will be informed.

## 2019-09-13 ENCOUNTER — Encounter (HOSPITAL_COMMUNITY): Payer: Medicare Other

## 2019-09-16 ENCOUNTER — Encounter (HOSPITAL_COMMUNITY): Payer: Medicare Other

## 2019-09-18 ENCOUNTER — Encounter (HOSPITAL_COMMUNITY): Payer: Medicare Other

## 2019-09-20 ENCOUNTER — Encounter (HOSPITAL_COMMUNITY): Payer: Medicare Other

## 2019-09-23 ENCOUNTER — Encounter (HOSPITAL_COMMUNITY): Payer: Medicare Other

## 2019-09-25 ENCOUNTER — Ambulatory Visit: Payer: Medicare Other | Admitting: Cardiovascular Disease

## 2019-09-25 ENCOUNTER — Encounter: Payer: Self-pay | Admitting: Cardiovascular Disease

## 2019-09-25 ENCOUNTER — Encounter (HOSPITAL_COMMUNITY): Payer: Medicare Other

## 2019-09-25 ENCOUNTER — Other Ambulatory Visit: Payer: Self-pay

## 2019-09-25 VITALS — BP 125/62 | HR 97 | Temp 95.9°F | Ht 67.0 in | Wt 174.0 lb

## 2019-09-25 DIAGNOSIS — I25118 Atherosclerotic heart disease of native coronary artery with other forms of angina pectoris: Secondary | ICD-10-CM | POA: Diagnosis not present

## 2019-09-25 DIAGNOSIS — E785 Hyperlipidemia, unspecified: Secondary | ICD-10-CM | POA: Diagnosis not present

## 2019-09-25 DIAGNOSIS — I6523 Occlusion and stenosis of bilateral carotid arteries: Secondary | ICD-10-CM | POA: Diagnosis not present

## 2019-09-25 DIAGNOSIS — Z720 Tobacco use: Secondary | ICD-10-CM

## 2019-09-25 MED ORDER — METOPROLOL TARTRATE 25 MG PO TABS: 12.5000 mg | ORAL_TABLET | Freq: Two times a day (BID) | ORAL | 6 refills | Status: DC

## 2019-09-25 MED ORDER — CLOPIDOGREL BISULFATE 75 MG PO TABS: ORAL_TABLET | ORAL | 3 refills | Status: DC

## 2019-09-25 MED ORDER — ATORVASTATIN CALCIUM 80 MG PO TABS: 80.0000 mg | ORAL_TABLET | Freq: Every day | ORAL | 3 refills | Status: DC

## 2019-09-25 MED ORDER — VARENICLINE TARTRATE 1 MG PO TABS: 1.0000 mg | ORAL_TABLET | Freq: Two times a day (BID) | ORAL | 3 refills | Status: DC

## 2019-09-25 NOTE — Progress Notes (Signed)
SUBJECTIVE: The patient presents for routine follow-up.  She underwent right transcarotid revascularization/stenting on 08/02/2019.  She sustained a non-STEMI in July 2020 and was found to have 100% mid RCA stenosis treated with a drug-eluting stent.  She is doing well overall.  She has a burning sensation in her chest if she walks outside in the cold.  She otherwise denies exertional chest pain.  Shortness of breath did not improve with discontinuation of Brilinta and she thinks it is more related to her lungs given her long history of tobacco use.  The amount of cigarettes she smokes varies with levels of stress at home.  Her 65 year old adopted daughter lives with her and she has bipolar disorder and PTSD.     Review of Systems: As per "subjective", otherwise negative.  Allergies  Allergen Reactions  . Zegerid [Omeprazole-Sodium Bicarbonate]     Irregular Heart Beat    Current Outpatient Medications  Medication Sig Dispense Refill  . aspirin EC 81 MG EC tablet Take 1 tablet (81 mg total) by mouth daily. 30 tablet 3  . atorvastatin (LIPITOR) 80 MG tablet Take 1 tablet (80 mg total) by mouth daily at 6 PM. (Patient taking differently: Take 80 mg by mouth daily. ) 90 tablet 3  . clopidogrel (PLAVIX) 75 MG tablet Take 300 mg ( 4 tablets) the first day, then take 75 mg ( 1 tablet) daily thereafter 94 tablet 3  . cyanocobalamin (,VITAMIN B-12,) 1000 MCG/ML injection Inject 1 mL into the muscle every 30 (thirty) days.    Marland Kitchen LORazepam (ATIVAN) 1 MG tablet Take 1 mg by mouth 2 (two) times daily as needed for anxiety.     . metoprolol tartrate (LOPRESSOR) 25 MG tablet Take 0.5 tablets (12.5 mg total) by mouth 2 (two) times daily. 30 tablet 6  . NALTREXONE HCL PO Take 4.5 mg by mouth every morning.    . NP THYROID 15 MG tablet Take 15 mg by mouth daily.    . NP THYROID 90 MG tablet Take 1 tablet by mouth daily before breakfast.    . PROAIR HFA 108 (90 Base) MCG/ACT inhaler Inhale 1  puff into the lungs every 6 (six) hours as needed for wheezing or shortness of breath.     . SYMBICORT 160-4.5 MCG/ACT inhaler Inhale 1 puff into the lungs daily as needed (shortness of breath and wheezing).      No current facility-administered medications for this visit.     Past Medical History:  Diagnosis Date  . Anxiety   . Arthritis   . Asthma   . Cataracts, bilateral    Tabitha Schmidt  . Complication of anesthesia    Note: pt has hx of TMJ " every now and then my jaw will lock "  . COPD (chronic obstructive pulmonary disease) (HCC)   . Coronary artery disease    a. s/p DES to mid-RCA in 05/2019  . DM (diabetes mellitus) (HCC)    "came off medicines with dr's permission"  . Family history of adverse reaction to anesthesia    " my son woke up crying "  . Fatty liver   . Fibromyalgia   . GERD (gastroesophageal reflux disease)   . Hyperlipidemia    stopped meds on her own  . Hypothyroid   . Myocardial infarction (HCC)   . On supplemental oxygen therapy    at night and PRN  . Pneumonia   . Shortness of breath   . Stenosis of right carotid  artery   . TMJ (dislocation of temporomandibular joint)     Past Surgical History:  Procedure Laterality Date  . ABDOMINAL HYSTERECTOMY    . BREAST SURGERY     removal of bilateral benign tumors  . CARDIAC CATHETERIZATION     2010  . CHOLECYSTECTOMY    . CORONARY STENT INTERVENTION N/A 05/18/2019   Procedure: CORONARY STENT INTERVENTION;  Surgeon: Lorretta Harp, MD;  Location: [facility];  Service: Cardiovascular;  Laterality: N/A;  . DILATION AND CURETTAGE OF UTERUS    . ESOPHAGOGASTRODUODENOSCOPY  01/26/2012   Procedure: ESOPHAGOGASTRODUODENOSCOPY (EGD);  Surgeon: Rogene Houston, MD;  [facility]: [facility];  Service: Endoscopy;  Laterality: N/A;  730  . LEFT HEART CATH AND CORONARY ANGIOGRAPHY N/A 05/18/2019   Procedure: LEFT HEART CATH AND CORONARY ANGIOGRAPHY;  Surgeon: Lorretta Harp, MD;  Location: [facility];  Service: Cardiovascular;  Laterality: N/A;  . TRANSCAROTID ARTERY REVASCULARIZATION Right 08/02/2019   Procedure: TRANSCAROTID ARTERY REVASCULARIZATION RIGHT;  Surgeon: Serafina Mitchell, MD;  Location: [facility];  Service: Vascular;  Laterality: Right;  . TUBAL LIGATION      Social History   Socioeconomic History  . Marital status: Single    Spouse name: Not on file  . Number of children: Not on file  . Years of education: Not on file  . Highest education level: Not on file  Occupational History  . Not on file  Social Needs  . Financial resource strain: Not on file  . Food insecurity    Worry: Not on file    Inability: Not on file  . Transportation needs    Medical: Not on file    Non-medical: Not on file  Tobacco Use  . Smoking status: Current Every Day Smoker    Packs/day: 0.50    Years: 40.00    Pack years: 20.00    Types: Cigarettes  . Smokeless tobacco: Never Used  Substance and Sexual Activity  . Alcohol use: No  . Drug use: No    Types: Cocaine  . Sexual activity: Yes    Birth control/protection: Surgical  Lifestyle  . Physical activity    Days per week: Not on file    Minutes per session: Not on file  . Stress: Not on file  Relationships  . Social Herbalist on phone: Not on file    Gets together: Not on file    Attends religious service: Not on file    Active member of club or organization: Not on file    Attends meetings of clubs or organizations: Not on file    Relationship status: Not on file  . Intimate partner violence    Fear of current or ex partner: Not on file    Emotionally abused: Not on file    Physically abused: Not on file    Forced sexual activity: Not on file  Other Topics Concern  . Not on file  Social History Narrative  . Not on file     Vitals:   09/25/19 0822  Height: 5\' 7"  (1.702 m)    Wt Readings from Last 3 Encounters:  09/02/19 176 lb 11.2 oz (80.2 kg)  08/22/19 177 lb 12.8 oz (80.6 kg)  08/02/19 180  lb (81.6 kg)     PHYSICAL EXAM General: NAD HEENT: Normal. Neck: No JVD, no thyromegaly. Lungs: Clear to auscultation bilaterally with normal respiratory effort. CV: Regular rate and rhythm, normal S1/S2, no S3/S4,  no murmur. No pretibial or periankle edema.  No carotid bruit.   Abdomen: Soft, nontender, no distention.  Neurologic: Alert and oriented.  Psych: Normal affect. Skin: Normal. Musculoskeletal: No gross deformities.      Labs: Lab Results  Component Value Date/Time   K 3.9 08/03/2019 03:53 AM   BUN 11 08/03/2019 03:53 AM   CREATININE 0.69 08/03/2019 03:53 AM   ALT 26 08/01/2019 10:32 AM   TSH 0.152 (L) 05/13/2019 11:46 AM   TSH 2.00 11/04/2015 09:08 AM   HGB 11.8 (L) 08/03/2019 03:53 AM     Lipids: Lab Results  Component Value Date/Time   LDLCALC 113 (H) 05/14/2019 01:29 AM   CHOL 181 05/14/2019 01:29 AM   TRIG 167 (H) 05/14/2019 01:29 AM   HDL 35 (L) 05/14/2019 01:29 AM       Additional studies/ records that were reviewed today include:   Echocardiogram: 05/13/2019 IMPRESSIONS   1. The left ventricle has normal systolic function with an ejection fraction of 60-65%. The cavity size was normal. There is mild concentric left ventricular hypertrophy. Left ventricular diastolic Doppler parameters are consistent with impaired  relaxation. No evidence of left ventricular regional wall motion abnormalities. 2. The right ventricle has normal systolic function. The cavity was normal. There is no increase in right ventricular wall thickness. 3. The mitral valve is grossly normal. Mild thickening of the mitral valve leaflet. 4. The tricuspid valve is grossly normal. 5. The aortic valve is tricuspid. 6. The aortic root is normal in size and structure.  NST: 05/14/2019  This is a low risk study.  The left ventricular ejection fraction is hyperdynamic (>65%).  Small inferior wall defect from apex to base. No ischemia. Normal functional images with  EF 72% No RWMA. Most likely related to motion artifact and diaphragmatic attenuation Low risk study  Cardiac Catheterization: 05/18/2019  Mid RCA lesion is 100% stenosed.  A drug-eluting stent was successfully placed.  Post intervention, there is a 0% residual stenosis.  There is mild left ventricular systolic dysfunction.  LV end diastolic pressure is mildly elevated.  The left ventricular ejection fraction is 50-55% by visual estimate.  IMPRESSION:Successful PCI and drug-eluting stent of a dominant mid RCA occlusion with grade 1 left-to-right collaterals in the setting of non-STEMI and ongoing chest pain despite IV heparin and nitroglycerin. Her left system was free of disease. Her EF was preserved in the 50% range with moderate inferior hypokinesia. She will need uninterrupted dual antiplatelet therapy for 12 months. Cardiac risk factor modification including smoking cessation, high-dose statin therapy. The patient can most likely be discharged home tomorrow.   ASSESSMENT AND PLAN: 1.  Coronary artery disease: Status post drug-eluting stent placement for 100% mid RCA stenosis in the context of non-STEMI in July 2020.  Symptomatically stable.  Continue aspirin, clopidogrel, statin and beta-blocker.  2.  Hyperlipidemia: Continue atorvastatin.  LDL 113 on 05/14/2019.  3.  Tobacco use: Previously prescribed Chantix.  I will refill.  4.  Bilateral internal carotid artery stenosis: She underwent right transcarotid revascularization/stenting on 08/02/2019.    Disposition: Follow up 6 months   Prentice Docker, M.D., F.A.C.C.

## 2019-09-25 NOTE — Patient Instructions (Signed)

## 2019-09-27 ENCOUNTER — Encounter (HOSPITAL_COMMUNITY): Payer: Medicare Other

## 2019-09-30 ENCOUNTER — Encounter (HOSPITAL_COMMUNITY): Payer: Medicare Other

## 2019-10-02 ENCOUNTER — Encounter (HOSPITAL_COMMUNITY): Payer: Medicare Other

## 2019-10-04 ENCOUNTER — Encounter (HOSPITAL_COMMUNITY): Payer: Medicare Other

## 2019-10-07 ENCOUNTER — Encounter (HOSPITAL_COMMUNITY): Payer: Medicare Other

## 2019-10-09 ENCOUNTER — Encounter (HOSPITAL_COMMUNITY): Payer: Medicare Other

## 2019-10-11 ENCOUNTER — Encounter (HOSPITAL_COMMUNITY): Payer: Medicare Other

## 2019-10-14 ENCOUNTER — Encounter (HOSPITAL_COMMUNITY): Payer: Medicare Other

## 2019-10-16 ENCOUNTER — Encounter (HOSPITAL_COMMUNITY): Payer: Medicare Other

## 2019-10-18 ENCOUNTER — Encounter (HOSPITAL_COMMUNITY): Payer: Medicare Other

## 2019-10-21 ENCOUNTER — Encounter (HOSPITAL_COMMUNITY): Payer: Medicare Other

## 2019-10-23 ENCOUNTER — Encounter (HOSPITAL_COMMUNITY): Payer: Medicare Other

## 2019-10-25 ENCOUNTER — Encounter (HOSPITAL_COMMUNITY): Payer: Medicare Other

## 2019-10-28 ENCOUNTER — Encounter (HOSPITAL_COMMUNITY): Payer: Medicare Other

## 2019-10-30 ENCOUNTER — Encounter (HOSPITAL_COMMUNITY): Payer: Medicare Other

## 2019-11-01 ENCOUNTER — Encounter (HOSPITAL_COMMUNITY): Payer: Medicare Other

## 2019-11-04 ENCOUNTER — Encounter (HOSPITAL_COMMUNITY): Payer: Medicare Other

## 2019-11-06 ENCOUNTER — Encounter (HOSPITAL_COMMUNITY): Payer: Medicare Other

## 2019-11-08 ENCOUNTER — Encounter (HOSPITAL_COMMUNITY): Payer: Medicare Other

## 2019-11-11 ENCOUNTER — Encounter (HOSPITAL_COMMUNITY): Payer: Medicare Other

## 2019-11-13 ENCOUNTER — Encounter (HOSPITAL_COMMUNITY): Payer: Medicare Other

## 2019-11-15 ENCOUNTER — Encounter (HOSPITAL_COMMUNITY): Payer: Medicare Other

## 2019-12-04 ENCOUNTER — Ambulatory Visit: Payer: Medicare Other | Admitting: Family Medicine

## 2019-12-11 DIAGNOSIS — I10 Essential (primary) hypertension: Secondary | ICD-10-CM | POA: Diagnosis not present

## 2019-12-11 DIAGNOSIS — Z1329 Encounter for screening for other suspected endocrine disorder: Secondary | ICD-10-CM | POA: Diagnosis not present

## 2019-12-11 DIAGNOSIS — E039 Hypothyroidism, unspecified: Secondary | ICD-10-CM | POA: Diagnosis not present

## 2019-12-11 DIAGNOSIS — Z1211 Encounter for screening for malignant neoplasm of colon: Secondary | ICD-10-CM | POA: Diagnosis not present

## 2019-12-11 DIAGNOSIS — E785 Hyperlipidemia, unspecified: Secondary | ICD-10-CM | POA: Diagnosis not present

## 2019-12-16 ENCOUNTER — Telehealth: Payer: Self-pay | Admitting: Student

## 2019-12-16 DIAGNOSIS — I10 Essential (primary) hypertension: Secondary | ICD-10-CM | POA: Diagnosis not present

## 2019-12-16 DIAGNOSIS — E785 Hyperlipidemia, unspecified: Secondary | ICD-10-CM | POA: Diagnosis not present

## 2019-12-16 DIAGNOSIS — K921 Melena: Secondary | ICD-10-CM | POA: Diagnosis not present

## 2019-12-16 DIAGNOSIS — I25119 Atherosclerotic heart disease of native coronary artery with unspecified angina pectoris: Secondary | ICD-10-CM | POA: Diagnosis not present

## 2019-12-16 DIAGNOSIS — J449 Chronic obstructive pulmonary disease, unspecified: Secondary | ICD-10-CM | POA: Diagnosis not present

## 2019-12-16 NOTE — Telephone Encounter (Signed)
Per phone call from pt-- she's been w/o her clopidogrel (PLAVIX) 75 MG tablet [[id number]]  For 5 days because of a mix up at the drug store-- please let pt know what she should do  [phone]

## 2019-12-16 NOTE — Telephone Encounter (Signed)
Patient has restarted plavix as it was not filled by [facility] over the [employer]

## 2019-12-24 DIAGNOSIS — J449 Chronic obstructive pulmonary disease, unspecified: Secondary | ICD-10-CM | POA: Diagnosis not present

## 2020-01-21 DIAGNOSIS — J449 Chronic obstructive pulmonary disease, unspecified: Secondary | ICD-10-CM | POA: Diagnosis not present

## 2020-02-17 DIAGNOSIS — H8110 Benign paroxysmal vertigo, unspecified ear: Secondary | ICD-10-CM | POA: Diagnosis not present

## 2020-02-21 DIAGNOSIS — J449 Chronic obstructive pulmonary disease, unspecified: Secondary | ICD-10-CM | POA: Diagnosis not present

## 2020-03-18 DIAGNOSIS — I10 Essential (primary) hypertension: Secondary | ICD-10-CM | POA: Diagnosis not present

## 2020-03-18 DIAGNOSIS — E039 Hypothyroidism, unspecified: Secondary | ICD-10-CM | POA: Diagnosis not present

## 2020-03-18 DIAGNOSIS — Z1211 Encounter for screening for malignant neoplasm of colon: Secondary | ICD-10-CM | POA: Diagnosis not present

## 2020-03-22 DIAGNOSIS — J449 Chronic obstructive pulmonary disease, unspecified: Secondary | ICD-10-CM | POA: Diagnosis not present

## 2020-03-23 DIAGNOSIS — I10 Essential (primary) hypertension: Secondary | ICD-10-CM | POA: Diagnosis not present

## 2020-03-23 DIAGNOSIS — I25119 Atherosclerotic heart disease of native coronary artery with unspecified angina pectoris: Secondary | ICD-10-CM | POA: Diagnosis not present

## 2020-03-23 DIAGNOSIS — E785 Hyperlipidemia, unspecified: Secondary | ICD-10-CM | POA: Diagnosis not present

## 2020-03-23 DIAGNOSIS — J449 Chronic obstructive pulmonary disease, unspecified: Secondary | ICD-10-CM | POA: Diagnosis not present

## 2020-03-24 ENCOUNTER — Ambulatory Visit (INDEPENDENT_AMBULATORY_CARE_PROVIDER_SITE_OTHER): Payer: Medicare Other | Admitting: Cardiovascular Disease

## 2020-03-24 ENCOUNTER — Other Ambulatory Visit: Payer: Self-pay

## 2020-03-24 ENCOUNTER — Encounter: Payer: Self-pay | Admitting: Cardiovascular Disease

## 2020-03-24 VITALS — BP 126/56 | HR 78 | Ht 66.5 in | Wt 175.4 lb

## 2020-03-24 DIAGNOSIS — Z95828 Presence of other vascular implants and grafts: Secondary | ICD-10-CM

## 2020-03-24 DIAGNOSIS — E785 Hyperlipidemia, unspecified: Secondary | ICD-10-CM | POA: Diagnosis not present

## 2020-03-24 DIAGNOSIS — Z9889 Other specified postprocedural states: Secondary | ICD-10-CM

## 2020-03-24 DIAGNOSIS — Z720 Tobacco use: Secondary | ICD-10-CM

## 2020-03-24 DIAGNOSIS — I25118 Atherosclerotic heart disease of native coronary artery with other forms of angina pectoris: Secondary | ICD-10-CM | POA: Diagnosis not present

## 2020-03-24 DIAGNOSIS — I6523 Occlusion and stenosis of bilateral carotid arteries: Secondary | ICD-10-CM | POA: Diagnosis not present

## 2020-03-24 DIAGNOSIS — R252 Cramp and spasm: Secondary | ICD-10-CM

## 2020-03-24 MED ORDER — CLOPIDOGREL BISULFATE 75 MG PO TABS: ORAL_TABLET | ORAL | 0 refills | Status: DC

## 2020-03-24 NOTE — Patient Instructions (Signed)
Medication Instructions:  On the last day of July, 2021, you may STOP Plavix  *If you need a refill on your cardiac medications before your next appointment, please call your pharmacy*   Lab Work: None today If you have labs (blood work) drawn today and your tests are completely normal, you will receive your results only by: Marland Kitchen MyChart Message (if you have MyChart) OR . A paper copy in the mail If you have any lab test that is abnormal or we need to change your treatment, we will call you to review the results.   Testing/Procedures: None today   Follow-Up: At [facility], you and your health needs are our priority.  As part of our continuing mission to provide you with exceptional heart care, we have created designated Provider Care Teams.  These Care Teams include your primary Cardiologist (physician) and Advanced Practice Providers (APPs -  Physician Assistants and Nurse Practitioners) who all work together to provide you with the care you need, when you need it.  We recommend signing up for the patient portal called "MyChart".  Sign up information is provided on this After Visit Summary.  MyChart is used to connect with patients for Virtual Visits (Telemedicine).  Patients are able to view lab/test results, encounter notes, upcoming appointments, etc.  Non-urgent messages can be sent to your provider as well.   To learn more about what you can do with MyChart, go to [email or web address].    Your next appointment:   6 month(s)  The format for your next appointment:   In Person  Provider:   Prentice Docker, MD   Other Instructions None     Thank you for choosing  [facility] !

## 2020-03-24 NOTE — Progress Notes (Signed)
SUBJECTIVE: The patient presents for routine follow-up.  She underwent right transcarotid revascularization/stenting on 08/02/2019.  She sustained a non-STEMI in July 2020 and was found to have 100% mid RCA stenosis treated with a drug-eluting stent.  She may have some chest pains if she is working vigorously outdoors in the heat and humidity.  This also occurs in cold weather.  She denies routine exertional angina.  She has some shortness of breath which she attributes to COPD and her many years of smoking cigarettes.  She describes left big toe and arch cramps which occur primarily at night.  She drinks apple cider vinegar sporadically.  The amount of cigarettes she smokes varies with levels of stress at home.  Her 66 year old adopted daughter lives with her and she has bipolar disorder and PTSD.     Review of Systems: As per "subjective", otherwise negative.  Allergies  Allergen Reactions  . Zegerid [Omeprazole-Sodium Bicarbonate]     Irregular Heart Beat    Current Outpatient Medications  Medication Sig Dispense Refill  . aspirin EC 81 MG EC tablet Take 1 tablet (81 mg total) by mouth daily. 30 tablet 3  . atorvastatin (LIPITOR) 80 MG tablet Take 1 tablet (80 mg total) by mouth daily at 6 PM. 90 tablet 3  . BREZTRI AEROSPHERE 160-9-4.8 MCG/ACT AERO INHALE (2) PUFFS TWICEODAILY FOR COPD RINSE MOUTH AFTER INHALATION.    . clopidogrel (PLAVIX) 75 MG tablet Take 300 mg ( 4 tablets) the first day, then take 75 mg ( 1 tablet) daily thereafter 94 tablet 0  . cyanocobalamin (,VITAMIN B-12,) 1000 MCG/ML injection Inject 1 mL into the muscle every 30 (thirty) days.    Marland Kitchen LORazepam (ATIVAN) 1 MG tablet Take 1 mg by mouth 2 (two) times daily as needed for anxiety.     . metoprolol tartrate (LOPRESSOR) 25 MG tablet Take 0.5 tablets (12.5 mg total) by mouth 2 (two) times daily. 30 tablet 6  . NALTREXONE HCL PO Take 4.5 mg by mouth every morning.    . NP THYROID 15 MG tablet Take 15 mg  by mouth daily.    . NP THYROID 90 MG tablet Take 1 tablet by mouth daily before breakfast.    . PROAIR HFA 108 (90 Base) MCG/ACT inhaler Inhale 1 puff into the lungs every 6 (six) hours as needed for wheezing or shortness of breath.      No current facility-administered medications for this visit.    Past Medical History:  Diagnosis Date  . Anxiety   . Arthritis   . Asthma   . Cataracts, bilateral    [name]  . Complication of anesthesia    Note: pt has hx of TMJ " every now and then my jaw will lock "  . COPD (chronic obstructive pulmonary disease) (HCC)   . Coronary artery disease    a. s/p DES to mid-RCA in 05/2019  . DM (diabetes mellitus) (HCC)    "came off medicines with dr's permission"  . Family history of adverse reaction to anesthesia    " my son woke up crying "  . Fatty liver   . Fibromyalgia   . GERD (gastroesophageal reflux disease)   . Hyperlipidemia    stopped meds on her own  . Hypothyroid   . Myocardial infarction (HCC)   . On supplemental oxygen therapy    at night and PRN  . Pneumonia   . Shortness of breath   . Stenosis of right carotid artery   .  TMJ (dislocation of temporomandibular joint)     Past Surgical History:  Procedure Laterality Date  . ABDOMINAL HYSTERECTOMY    . BREAST SURGERY     removal of bilateral benign tumors  . CARDIAC CATHETERIZATION     2010  . CHOLECYSTECTOMY    . CORONARY STENT INTERVENTION N/A 05/18/2019   Procedure: CORONARY STENT INTERVENTION;  Surgeon: Runell Gess, MD;  Location: [facility];  Service: Cardiovascular;  Laterality: N/A;  . DILATION AND CURETTAGE OF UTERUS    . ESOPHAGOGASTRODUODENOSCOPY  01/26/2012   Procedure: ESOPHAGOGASTRODUODENOSCOPY (EGD);  Surgeon: Malissa Hippo, MD;  [facility];  Service: Endoscopy;  Laterality: N/A;  730  . LEFT HEART CATH AND CORONARY ANGIOGRAPHY N/A 05/18/2019   Procedure: LEFT HEART CATH AND CORONARY ANGIOGRAPHY;  Surgeon: Runell Gess, MD;   Location: [facility];  Service: Cardiovascular;  Laterality: N/A;  . TRANSCAROTID ARTERY REVASCULARIZATION Right 08/02/2019   Procedure: TRANSCAROTID ARTERY REVASCULARIZATION RIGHT;  Surgeon: Nada Libman, MD;  Location: [facility];  Service: Vascular;  Laterality: Right;  . TUBAL LIGATION      Social History   Socioeconomic History  . Marital status: Single    Spouse name: Not on file  . Number of children: Not on file  . Years of education: Not on file  . Highest education level: Not on file  Occupational History  . Not on file  Tobacco Use  . Smoking status: Current Every Day Smoker    Packs/day: 0.50    Years: 40.00    Pack years: 20.00    Types: Cigarettes  . Smokeless tobacco: Never Used  Substance and Sexual Activity  . Alcohol use: No  . Drug use: No    Types: Cocaine  . Sexual activity: Yes    Birth control/protection: Surgical  Other Topics Concern  . Not on file  Social History Narrative  . Not on file   Social Determinants of Health   Financial Resource Strain:   . Difficulty of Paying Living Expenses:   Food Insecurity:   . Worried About Programme researcher, broadcasting/film/video in the Last Year:   . Barista in the Last Year:   Transportation Needs:   . Freight forwarder (Medical):   Marland Kitchen Lack of Transportation (Non-Medical):   Physical Activity:   . Days of Exercise per Week:   . Minutes of Exercise per Session:   Stress:   . Feeling of Stress :   Social Connections:   . Frequency of Communication with Friends and Family:   . Frequency of Social Gatherings with Friends and Family:   . Attends Religious Services:   . Active Member of Clubs or Organizations:   . Attends Banker Meetings:   Marland Kitchen Marital Status:   Intimate Partner Violence:   . Fear of Current or Ex-Partner:   . Emotionally Abused:   Marland Kitchen Physically Abused:   . Sexually Abused:    Marlyn Corporal, RN was present throughout the entirety of the encounter.  Vitals:    03/24/20 0805  BP: (!) 126/56  Pulse: 78  SpO2: 94%  Weight: 175 lb 6.4 oz (79.6 kg)  Height: 5' 6.5" (1.689 m)    Wt Readings from Last 3 Encounters:  03/24/20 175 lb 6.4 oz (79.6 kg)  09/25/19 174 lb (78.9 kg)  09/02/19 176 lb 11.2 oz (80.2 kg)     PHYSICAL EXAM General: NAD HEENT: Normal. Neck: No JVD, no thyromegaly.  Lungs: Clear to auscultation bilaterally with normal respiratory effort. CV: Regular rate and rhythm, normal S1/S2, no S3/S4, no murmur. No pretibial or periankle edema.  No carotid bruit.   Abdomen: Soft, nontender, no distention.  Neurologic: Alert and oriented.  Psych: Normal affect. Skin: Normal. Musculoskeletal: No gross deformities.      Labs: Lab Results  Component Value Date/Time   K 3.9 08/03/2019 03:53 AM   BUN 11 08/03/2019 03:53 AM   CREATININE 0.69 08/03/2019 03:53 AM   ALT 26 08/01/2019 10:32 AM   TSH 0.152 (L) 05/13/2019 11:46 AM   TSH 2.00 11/04/2015 09:08 AM   HGB 11.8 (L) 08/03/2019 03:53 AM     Lipids: Lab Results  Component Value Date/Time   LDLCALC 113 (H) 05/14/2019 01:29 AM   CHOL 181 05/14/2019 01:29 AM   TRIG 167 (H) 05/14/2019 01:29 AM   HDL 35 (L) 05/14/2019 01:29 AM       Additional studies/ records that were reviewed today include:   Echocardiogram: 05/13/2019 IMPRESSIONS   1. The left ventricle has normal systolic function with an ejection fraction of 60-65%. The cavity size was normal. There is mild concentric left ventricular hypertrophy. Left ventricular diastolic Doppler parameters are consistent with impaired  relaxation. No evidence of left ventricular regional wall motion abnormalities. 2. The right ventricle has normal systolic function. The cavity was normal. There is no increase in right ventricular wall thickness. 3. The mitral valve is grossly normal. Mild thickening of the mitral valve leaflet. 4. The tricuspid valve is grossly normal. 5. The aortic valve is tricuspid. 6. The aortic  root is normal in size and structure.  NST: 05/14/2019  This is a low risk study.  The left ventricular ejection fraction is hyperdynamic (>65%).  Small inferior wall defect from apex to base. No ischemia. Normal functional images with EF 72% No RWMA. Most likely related to motion artifact and diaphragmatic attenuation Low risk study  Cardiac Catheterization: 05/18/2019  Mid RCA lesion is 100% stenosed.  A drug-eluting stent was successfully placed.  Post intervention, there is a 0% residual stenosis.  There is mild left ventricular systolic dysfunction.  LV end diastolic pressure is mildly elevated.  The left ventricular ejection fraction is 50-55% by visual estimate.  IMPRESSION:Successful PCI and drug-eluting stent of a dominant mid RCA occlusion with grade 1 left-to-right collaterals in the setting of non-STEMI and ongoing chest pain despite IV heparin and nitroglycerin. Her left system was free of disease. Her EF was preserved in the 50% range with moderate inferior hypokinesia. She will need uninterrupted dual antiplatelet therapy for 12 months. Cardiac risk factor modification including smoking cessation, high-dose statin therapy. The patient can most likely be discharged home tomorrow.   ASSESSMENT AND PLAN: 1.  Coronary artery disease: Status post drug-eluting stent placement for 100% mid RCA stenosis in the context of non-STEMI in July 2020.  Symptomatically stable.  Continue aspirin, clopidogrel, statin and beta-blocker.  She will stop clopidogrel at the end of July and be maintained on aspirin 81 mg.  2.  Hyperlipidemia: Continue atorvastatin.  Goal LDL less than 70.  She told me her PCP checked her lipids about 2 weeks ago and it was reported to her that her results were very good.  3.  Tobacco use: Previously prescribed Chantix.   4.  Bilateral internal carotid artery stenosis: She underwent right transcarotid revascularization/stenting on 08/02/2019.   Continue statin therapy.  5.  Feet cramping: Dorsalis pedis and posterior tibial pulses are normal.  I did inform her about the association with tobacco use and peripheral arterial disease.  She denies claudication pain per se.  We talked about some over-the-counter remedies.    Disposition: Follow up 6 months   Kate Sable, M.D., F.A.C.C.

## 2020-04-21 ENCOUNTER — Other Ambulatory Visit: Payer: Self-pay | Admitting: Cardiovascular Disease

## 2020-04-22 DIAGNOSIS — J449 Chronic obstructive pulmonary disease, unspecified: Secondary | ICD-10-CM | POA: Diagnosis not present

## 2020-05-07 DIAGNOSIS — S39092S Other injury of muscle, fascia and tendon of lower back, sequela: Secondary | ICD-10-CM | POA: Diagnosis not present

## 2020-05-07 DIAGNOSIS — Z1211 Encounter for screening for malignant neoplasm of colon: Secondary | ICD-10-CM | POA: Diagnosis not present

## 2020-05-07 DIAGNOSIS — M545 Low back pain: Secondary | ICD-10-CM | POA: Diagnosis not present

## 2020-05-22 DIAGNOSIS — J449 Chronic obstructive pulmonary disease, unspecified: Secondary | ICD-10-CM | POA: Diagnosis not present

## 2020-06-22 DIAGNOSIS — J449 Chronic obstructive pulmonary disease, unspecified: Secondary | ICD-10-CM | POA: Diagnosis not present

## 2020-06-25 DIAGNOSIS — S39092S Other injury of muscle, fascia and tendon of lower back, sequela: Secondary | ICD-10-CM | POA: Diagnosis not present

## 2020-06-25 DIAGNOSIS — E039 Hypothyroidism, unspecified: Secondary | ICD-10-CM | POA: Diagnosis not present

## 2020-06-25 DIAGNOSIS — M545 Low back pain: Secondary | ICD-10-CM | POA: Diagnosis not present

## 2020-06-25 DIAGNOSIS — E785 Hyperlipidemia, unspecified: Secondary | ICD-10-CM | POA: Diagnosis not present

## 2020-06-25 DIAGNOSIS — Z1211 Encounter for screening for malignant neoplasm of colon: Secondary | ICD-10-CM | POA: Diagnosis not present

## 2020-06-25 DIAGNOSIS — Z1329 Encounter for screening for other suspected endocrine disorder: Secondary | ICD-10-CM | POA: Diagnosis not present

## 2020-06-29 DIAGNOSIS — E785 Hyperlipidemia, unspecified: Secondary | ICD-10-CM | POA: Diagnosis not present

## 2020-06-29 DIAGNOSIS — J449 Chronic obstructive pulmonary disease, unspecified: Secondary | ICD-10-CM | POA: Diagnosis not present

## 2020-06-29 DIAGNOSIS — I25119 Atherosclerotic heart disease of native coronary artery with unspecified angina pectoris: Secondary | ICD-10-CM | POA: Diagnosis not present

## 2020-06-29 DIAGNOSIS — Z0001 Encounter for general adult medical examination with abnormal findings: Secondary | ICD-10-CM | POA: Diagnosis not present

## 2020-06-29 DIAGNOSIS — I10 Essential (primary) hypertension: Secondary | ICD-10-CM | POA: Diagnosis not present

## 2020-07-23 DIAGNOSIS — J449 Chronic obstructive pulmonary disease, unspecified: Secondary | ICD-10-CM | POA: Diagnosis not present

## 2020-07-28 DIAGNOSIS — Z0001 Encounter for general adult medical examination with abnormal findings: Secondary | ICD-10-CM | POA: Diagnosis not present

## 2020-07-28 DIAGNOSIS — E039 Hypothyroidism, unspecified: Secondary | ICD-10-CM | POA: Diagnosis not present

## 2020-07-28 DIAGNOSIS — S39092S Other injury of muscle, fascia and tendon of lower back, sequela: Secondary | ICD-10-CM | POA: Diagnosis not present

## 2020-07-28 DIAGNOSIS — M545 Low back pain: Secondary | ICD-10-CM | POA: Diagnosis not present

## 2020-07-28 DIAGNOSIS — Z1211 Encounter for screening for malignant neoplasm of colon: Secondary | ICD-10-CM | POA: Diagnosis not present

## 2020-07-30 DIAGNOSIS — G4700 Insomnia, unspecified: Secondary | ICD-10-CM | POA: Diagnosis not present

## 2020-07-30 DIAGNOSIS — M130 Polyarthritis, unspecified: Secondary | ICD-10-CM | POA: Diagnosis not present

## 2020-07-30 DIAGNOSIS — E039 Hypothyroidism, unspecified: Secondary | ICD-10-CM | POA: Diagnosis not present

## 2020-08-06 DIAGNOSIS — M25551 Pain in right hip: Secondary | ICD-10-CM | POA: Diagnosis not present

## 2020-08-06 DIAGNOSIS — M25552 Pain in left hip: Secondary | ICD-10-CM | POA: Diagnosis not present

## 2020-08-12 ENCOUNTER — Ambulatory Visit (INDEPENDENT_AMBULATORY_CARE_PROVIDER_SITE_OTHER): Payer: Medicare Other | Admitting: Internal Medicine

## 2020-08-12 ENCOUNTER — Other Ambulatory Visit: Payer: Self-pay

## 2020-08-12 ENCOUNTER — Encounter: Payer: Self-pay | Admitting: Internal Medicine

## 2020-08-12 ENCOUNTER — Ambulatory Visit (INDEPENDENT_AMBULATORY_CARE_PROVIDER_SITE_OTHER): Payer: Medicare Other

## 2020-08-12 DIAGNOSIS — R059 Cough, unspecified: Secondary | ICD-10-CM | POA: Diagnosis not present

## 2020-08-12 DIAGNOSIS — F17210 Nicotine dependence, cigarettes, uncomplicated: Secondary | ICD-10-CM

## 2020-08-12 DIAGNOSIS — J449 Chronic obstructive pulmonary disease, unspecified: Secondary | ICD-10-CM

## 2020-08-12 DIAGNOSIS — R0602 Shortness of breath: Secondary | ICD-10-CM | POA: Diagnosis not present

## 2020-08-12 NOTE — Patient Instructions (Addendum)
Make sure you check your oxygen saturations at highest level of activity to be sure it stays over 90% and keep track of it at least once a week, more often if breathing getting worse, and let me know if losing ground.   Work on inhaler technique:  relax and gently blow all the way out then take a nice smooth deep breath back in, triggering the inhaler at same time you start breathing in.  Hold for up to 5 seconds if you can. Blow out thru nose. Rinse and gargle with water when done    The key is to stop smoking completely before smoking completely stops you!  Please remember to go to the  x-ray department  for your tests - we will call you with the results when they are available    We will set up for PFTs in [place] and follow up in that office - bring all your inhalers / spacer Add needs alpha one at screen on return

## 2020-08-12 NOTE — Progress Notes (Signed)
Tabitha Schmidt, female    DOB: 06-17-1956      MRN: [id number]   Brief patient profile:  40 yowf active smoker with h/o asthma in her 30/40's took allergy shots and no need for any inhalers p stopped them then abruptly ill 12/2018 fever aches cough - first two symptoms resolved was left with cough variably productive and sob and nothing seemed to help with mulitple ov's with Juanetta Gosling smidge better then MI 05/2019 and breathing about the same until placed on Breztri by Bjorn Pippin and much better but still c/o doe so referred to pulmonary clinic 08/12/2020 by Tillie Fantasia NP    History of Present Illness  08/12/2020  Pulmonary/ 1st office eval/Kealii Thueson  Chief Complaint  Patient presents with  . Consult    SOB and fatigue all the time  Dyspnea:  Mailbox is 100 ft slt incline / sats as low as 88% and sob when gets there  Cough: variably present day > noct Sleep: 30 hosp bed  02  2lpm hs  SABA use: once a week  No obvious patterns in day to day or daytime variability or assoc excess/ purulent sputum or mucus plugs or hemoptysis or cp or chest tightness, subjective wheeze or overt sinus or hb symptoms.   Sleeping  without nocturnal  or early am exacerbation  of respiratory  c/o's or need for noct saba. Also denies any obvious fluctuation of symptoms with weather or environmental changes or other aggravating or alleviating factors except as outlined above   No unusual exposure hx or h/o childhood pna/ asthma or knowledge of premature birth.  Current Allergies, Complete Past Medical History, Past Surgical History, Family History, and Social History were reviewed in [employer] record.  ROS  The following are not active complaints unless bolded Hoarseness, sore throat, dysphagia, dental problems, itching, sneezing,  nasal congestion or discharge of excess mucus or purulent secretions, ear ache,   fever, chills, sweats, unintended wt loss or wt gain, classically pleuritic or  exertional cp,  orthopnea pnd or arm/hand swelling  or leg swelling, presyncope, palpitations, abdominal pain, anorexia, nausea, vomiting, diarrhea  or change in bowel habits or change in bladder habits, change in stools or change in urine, dysuria, hematuria,  rash, arthralgias, visual complaints, headache, numbness, weakness or ataxia or problems with walking or coordination,  change in mood or  memory.           Past Medical History:  Diagnosis Date  . Anxiety   . Arthritis   . Asthma   . Cataracts, bilateral    [name]  . Complication of anesthesia    Note: pt has hx of TMJ " every now and then my jaw will lock "  . COPD (chronic obstructive pulmonary disease) (HCC)   . Coronary artery disease    a. s/p DES to mid-RCA in 05/2019  . DM (diabetes mellitus) (HCC)    "came off medicines with dr's permission"  . Family history of adverse reaction to anesthesia    " my son woke up crying "  . Fatty liver   . Fibromyalgia   . GERD (gastroesophageal reflux disease)   . Hyperlipidemia    stopped meds on her own  . Hypothyroid   . Myocardial infarction (HCC)   . On supplemental oxygen therapy    at night and PRN  . Pneumonia   . Shortness of breath   . Stenosis of right carotid artery   . TMJ (dislocation  of temporomandibular joint)     Outpatient Medications Prior to Visit  Medication Sig Dispense Refill  . aspirin EC 81 MG EC tablet Take 1 tablet (81 mg total) by mouth daily. 30 tablet 3  . atorvastatin (LIPITOR) 80 MG tablet Take 1 tablet (80 mg total) by mouth daily at 6 PM. 90 tablet 3  . BREZTRI AEROSPHERE 160-9-4.8 MCG/ACT AERO INHALE (2) PUFFS TWICEODAILY FOR COPD RINSE MOUTH AFTER INHALATION.    . clopidogrel (PLAVIX) 75 MG tablet Take 300 mg ( 4 tablets) the first day, then take 75 mg ( 1 tablet) daily thereafter 94 tablet 0  . cyanocobalamin (,VITAMIN B-12,) 1000 MCG/ML injection Inject 1 mL into the muscle every 30 (thirty) days.    Marland Kitchen LORazepam (ATIVAN) 1 MG tablet  Take 1 mg by mouth 2 (two) times daily as needed for anxiety.     . metoprolol tartrate (LOPRESSOR) 25 MG tablet Take 0.5 tablets (12.5 mg total) by mouth 2 (two) times daily. 30 tablet 10  . NALTREXONE HCL PO Take 4.5 mg by mouth every morning.    . NP THYROID 15 MG tablet Take 15 mg by mouth daily.    . NP THYROID 90 MG tablet Take 1 tablet by mouth daily before breakfast.    . PROAIR HFA 108 (90 Base) MCG/ACT inhaler Inhale 1 puff into the lungs every 6 (six) hours as needed for wheezing or shortness of breath.      No facility-administered medications prior to visit.     Objective:     BP 128/64 (BP Location: Left Arm, Cuff Size: Normal)   Pulse 96   Temp 98.4 F (36.9 C) (Oral)   Ht 5\' 7"  (1.702 m)   Wt 183 lb (83 kg)   SpO2 95% Comment: RA  BMI 28.66 kg/m   SpO2: 95 % (RA)   amb pleasant wf nad  HEENT : pt wearing mask not removed for exam due to covid - 19 concerns.   NECK :  without JVD/Nodes/TM/ nl carotid upstrokes bilaterally   LUNGS: no acc muscle use,  Min barrel  contour chest wall with bilateral  slightly decreased bs s audible wheeze and  without cough on insp or exp maneuvers and min  Hyperresonant  to  percussion bilaterally     CV:  RRR  no s3 or murmur or increase in P2, and no edema   ABD:  soft and nontender with pos end  insp Hoover's  in the supine position. No bruits or organomegaly appreciated, bowel sounds nl  MS:   Nl gait/  ext warm without deformities, calf tenderness, cyanosis or clubbing No obvious joint restrictions   SKIN: warm and dry without lesions    NEURO:  alert, approp, nl sensorium with  no motor or cerebellar deficits apparent.       CXR PA and Lateral:   08/12/2020 :    I personally reviewed images and   impression as follows:    mod copd/cb changes       Assessment   COPD  ? GOLD Stage/ active smoker Active smoker -  Assoc IHD with [facility]  7/182020  Mid RCA lesion is 100% stenosed.  A drug-eluting stent was  successfully placed.  Post intervention, there is a 0% residual stenosis.  There is mild left ventricular systolic dysfunction.  LV end diastolic pressure is mildly elevated. -  08/12/2020   Walked RA 3 laps @ approx 224ft each @ avg to above avgpace  stopped  due to end of study, no sob with sats at end = 95%     DDX of  difficult airways management almost all start with A and  include Adherence, Ace Inhibitors, Acid Reflux, Active Sinus Disease, Alpha 1 Antitripsin deficiency, Anxiety masquerading as Airways dz,  ABPA,  Allergy(esp in young), Aspiration (esp in elderly), Adverse effects of meds,  Active smoking or vaping, A bunch of PE's (a small clot burden can't cause this syndrome unless there is already severe underlying pulm or vascular dz with poor reserve) plus two Bs  = Bronchiectasis and Beta blocker use..and one C= CHF   Adherence is always the initial "prime suspect" and is a multilayered concern that requires a "trust but verify" approach in every patient - starting with knowing how to use medications, especially inhalers, correctly, keeping up with refills and understanding the fundamental difference between maintenance and prns vs those medications only taken for a very short course and then stopped and not refilled.  - The proper method of use, as well as anticipated side effects, of a metered-dose inhaler were discussed and demonstrated to the patient using teach back method.   -  Return with all meds in hand using a trust but verify approach to confirm accurate Medication  Reconciliation The principal here is that until we are certain that the  patients are doing what we've asked, it makes no sense to ask them to do more.   Active smoking at top of the usual list of suspects - see below  ? Alpha one AT def > no results in Epic > send screen next ov   ? Acid reflux > denies overt HB but that is not a good predictor or gerd assoc cough or sob > advised diet for now   ?  Allergy/asthma component  > high dose ICS should do for now and  Group D in terms of symptom/risk and laba/lama/ICS  therefore appropriate rx at this point >>>  breztri best bet pending return for pfts   ? Anxiety/depression/ deconditioning  > usually at the bottom of this list of usual suspects but should be much higher on this pt's based on H and P and note already on psychotropics and may interfere with adherence and also interpretation of response or lack thereof to symptom management which can be quite subjective.   ? chf > see above cath -  nothing by hx exam or cxr to suggest active cardiac asthma at this point though at risk esp since smoking also accelerates ascvd /advised   >>> f/u in [facility] with pfts / alpha one screen.     Cigarette smoker Counseled re importance of smoking cessation but did not meet time criteria for separate billing    Used dirty air in the air intake offsets the benefit of high octane (breztri)  Fuel so she could understand the meds don't offset the problem of progressive engine damage that occurs in this setting.          Each maintenance medication was reviewed in detail including emphasizing most importantly the difference between maintenance and prns and under what circumstances the prns are to be triggered using an action plan format where appropriate.  Total time for H and P, chart review, counseling, teaching device  directly observing portions of ambulatory 02 saturation study/  and generating customized AVS unique to this office visit / charting =45 min          Sandrea Hughs, MD 08/12/2020

## 2020-08-12 NOTE — Assessment & Plan Note (Signed)
Counseled re importance of smoking cessation but did not meet time criteria for separate billing    Used dirty air in the air intake offsets the benefit of high octane (breztri)  Fuel so she could understand the meds don't offset the problem of progressive engine damage that occurs in this setting.          Each maintenance medication was reviewed in detail including emphasizing most importantly the difference between maintenance and prns and under what circumstances the prns are to be triggered using an action plan format where appropriate.  Total time for H and P, chart review, counseling, teaching device  directly observing portions of ambulatory 02 saturation study/  and generating customized AVS unique to this office visit / charting =45 min

## 2020-08-12 NOTE — Assessment & Plan Note (Addendum)
Active smoker -  Assoc IHD with [facility]  7/182020  Mid RCA lesion is 100% stenosed.  A drug-eluting stent was successfully placed.  Post intervention, there is a 0% residual stenosis.  There is mild left ventricular systolic dysfunction.  LV end diastolic pressure is mildly elevated. -  08/12/2020   Walked RA 3 laps @ approx 210ft each @ avg to above avgpace  stopped due to end of study, no sob with sats at end = 95%     DDX of  difficult airways management almost all start with A and  include Adherence, Ace Inhibitors, Acid Reflux, Active Sinus Disease, Alpha 1 Antitripsin deficiency, Anxiety masquerading as Airways dz,  ABPA,  Allergy(esp in young), Aspiration (esp in elderly), Adverse effects of meds,  Active smoking or vaping, A bunch of PE's (a small clot burden can't cause this syndrome unless there is already severe underlying pulm or vascular dz with poor reserve) plus two Bs  = Bronchiectasis and Beta blocker use..and one C= CHF   Adherence is always the initial "prime suspect" and is a multilayered concern that requires a "trust but verify" approach in every patient - starting with knowing how to use medications, especially inhalers, correctly, keeping up with refills and understanding the fundamental difference between maintenance and prns vs those medications only taken for a very short course and then stopped and not refilled.  - The proper method of use, as well as anticipated side effects, of a metered-dose inhaler were discussed and demonstrated to the patient using teach back method.   -  Return with all meds in hand using a trust but verify approach to confirm accurate Medication  Reconciliation The principal here is that until we are certain that the  patients are doing what we've asked, it makes no sense to ask them to do more.   Active smoking at top of the usual list of suspects - see below  ? Alpha one AT def > no results in Epic > send screen next ov   ? Acid reflux >  denies overt HB but that is not a good predictor or gerd assoc cough or sob > advised diet for now   ? Allergy/asthma component  > high dose ICS should do for now and  Group D in terms of symptom/risk and laba/lama/ICS  therefore appropriate rx at this point >>>  breztri best bet pending return for pfts   ? Anxiety/depression/ deconditioning  > usually at the bottom of this list of usual suspects but should be much higher on this pt's based on H and P and note already on psychotropics and may interfere with adherence and also interpretation of response or lack thereof to symptom management which can be quite subjective.   ? chf > see above cath -  nothing by hx exam or cxr to suggest active cardiac asthma at this point though at risk esp since smoking also accelerates ascvd /advised   >>> f/u in [facility] with pfts / alpha one screen.

## 2020-08-13 ENCOUNTER — Ambulatory Visit (INDEPENDENT_AMBULATORY_CARE_PROVIDER_SITE_OTHER): Payer: Medicare Other | Admitting: Nurse Practitioner

## 2020-08-13 ENCOUNTER — Encounter: Payer: Self-pay | Admitting: Nurse Practitioner

## 2020-08-13 VITALS — BP 117/70 | HR 70 | Ht 67.0 in | Wt 183.0 lb

## 2020-08-13 DIAGNOSIS — E039 Hypothyroidism, unspecified: Secondary | ICD-10-CM

## 2020-08-13 MED ORDER — LEVOTHYROXINE SODIUM 125 MCG PO TABS: 125.0000 ug | ORAL_TABLET | Freq: Every day | ORAL | 3 refills | Status: DC

## 2020-08-13 NOTE — Progress Notes (Signed)
Endocrinology Consult Note                                         08/13/2020, 1:21 PM  SUBJECTIVE:  Tabitha Schmidt is a 64 y.o.-year-old female patient being seen in consultation for hypothyroidism referred by [name], MD.   Past Medical History:  Diagnosis Date  . Anxiety   . Arthritis   . Asthma   . Cataracts, bilateral    Tabitha Schmidt  . Complication of anesthesia    Note: pt has hx of TMJ " every now and then my jaw will lock "  . COPD (chronic obstructive pulmonary disease) (HCC)   . Coronary artery disease    a. s/p DES to mid-RCA in 05/2019  . DM (diabetes mellitus) (HCC)    "came off medicines with dr's permission"  . Family history of adverse reaction to anesthesia    " my son woke up crying "  . Fatty liver   . Fibromyalgia   . GERD (gastroesophageal reflux disease)   . Hyperlipidemia    stopped meds on her own  . Hypothyroid   . Myocardial infarction (HCC)   . On supplemental oxygen therapy    at night and PRN  . Pneumonia   . Shortness of breath   . Stenosis of right carotid artery   . TMJ (dislocation of temporomandibular joint)     Past Surgical History:  Procedure Laterality Date  . ABDOMINAL HYSTERECTOMY    . BREAST SURGERY     removal of bilateral benign tumors  . CARDIAC CATHETERIZATION     2010  . CHOLECYSTECTOMY    . CORONARY STENT INTERVENTION N/A 05/18/2019   Procedure: CORONARY STENT INTERVENTION;  Surgeon: Runell Gess, MD;  Location: [facility];  Service: Cardiovascular;  Laterality: N/A;  . DILATION AND CURETTAGE OF UTERUS    . ESOPHAGOGASTRODUODENOSCOPY  01/26/2012   Procedure: ESOPHAGOGASTRODUODENOSCOPY (EGD);  Surgeon: Malissa Hippo, MD;  Location: [facility];  Service: Endoscopy;  Laterality: N/A;  730  . LEFT HEART CATH AND CORONARY ANGIOGRAPHY N/A 05/18/2019   Procedure: LEFT HEART CATH AND CORONARY ANGIOGRAPHY;  Surgeon: Runell Gess, MD;  Location: [facility];  Service: Cardiovascular;  Laterality: N/A;  . TRANSCAROTID ARTERY REVASCULARIZATION Right 08/02/2019   Procedure: TRANSCAROTID ARTERY REVASCULARIZATION RIGHT;  Surgeon: Nada Libman, MD;  Location: [facility];  Service: Vascular;  Laterality: Right;  . TUBAL LIGATION      Social History   Socioeconomic History  . Marital status: Single    Spouse name: Not on file  . Number of children: Not on file  . Years of education: Not on file  . Highest education level: Not on file  Occupational History  . Not on file  Tobacco Use  . Smoking status: Current Every Day Smoker    Packs/day: 0.50    Years: 40.00    Pack years: 20.00  Types: Cigarettes  . Smokeless tobacco: Never Used  Vaping Use  . Vaping Use: Never used  Substance and Sexual Activity  . Alcohol use: No  . Drug use: No    Types: Cocaine  . Sexual activity: Yes    Birth control/protection: Surgical  Other Topics Concern  . Not on file  Social History Narrative  . Not on file   Social Determinants of Health   Financial Resource Strain:   . Difficulty of Paying Living Expenses: Not on file  Food Insecurity:   . Worried About Programme researcher, broadcasting/film/video in the Last Year: Not on file  . Ran Out of Food in the Last Year: Not on file  Transportation Needs:   . Lack of Transportation (Medical): Not on file  . Lack of Transportation (Non-Medical): Not on file  Physical Activity:   . Days of Exercise per Week: Not on file  . Minutes of Exercise per Session: Not on file  Stress:   . Feeling of Stress : Not on file  Social Connections:   . Frequency of Communication with Friends and Family: Not on file  . Frequency of Social Gatherings with Friends and Family: Not on file  . Attends Religious Services: Not on file  . Active Member of Clubs or Organizations: Not on file  . Attends Banker Meetings: Not on file  . Marital Status: Not on file    Family History  Problem  Relation Age of Onset  . CAD Father   . Anesthesia problems Neg Hx   . Hypotension Neg Hx   . Malignant hyperthermia Neg Hx   . Pseudochol deficiency Neg Hx     Outpatient Encounter Medications as of 08/13/2020  Medication Sig  . aspirin EC 81 MG EC tablet Take 1 tablet (81 mg total) by mouth daily.  Marland Kitchen atorvastatin (LIPITOR) 80 MG tablet Take 1 tablet (80 mg total) by mouth daily at 6 PM.  . BREZTRI AEROSPHERE 160-9-4.8 MCG/ACT AERO INHALE (2) PUFFS TWICEODAILY FOR COPD RINSE MOUTH AFTER INHALATION.  Marland Kitchen LORazepam (ATIVAN) 0.5 MG tablet Take 0.5 mg by mouth 4 (four) times daily as needed.  . meclizine (ANTIVERT) 25 MG tablet Take 25 mg by mouth every 6 (six) hours.  . metoprolol tartrate (LOPRESSOR) 25 MG tablet Take 0.5 tablets (12.5 mg total) by mouth 2 (two) times daily.  . mirtazapine (REMERON) 7.5 MG tablet Take 7.5 mg by mouth at bedtime.  Marland Kitchen NALTREXONE HCL PO Take 4.5 mg by mouth every morning.  Marland Kitchen PROAIR HFA 108 (90 Base) MCG/ACT inhaler Inhale 1 puff into the lungs every 6 (six) hours as needed for wheezing or shortness of breath.   . [DISCONTINUED] NP THYROID 90 MG tablet Take 1 tablet by mouth daily before breakfast.  . levothyroxine (SYNTHROID) 125 MCG tablet Take 1 tablet (125 mcg total) by mouth daily. Needs branded synthroid  . [DISCONTINUED] clopidogrel (PLAVIX) 75 MG tablet Take 300 mg ( 4 tablets) the first day, then take 75 mg ( 1 tablet) daily thereafter  . [DISCONTINUED] cyanocobalamin (,VITAMIN B-12,) 1000 MCG/ML injection Inject 1 mL into the muscle every 30 (thirty) days.  . [DISCONTINUED] LORazepam (ATIVAN) 1 MG tablet Take 1 mg by mouth 2 (two) times daily as needed for anxiety.   . [DISCONTINUED] methocarbamol (ROBAXIN) 500 MG tablet Take 500 mg by mouth 2 (two) times daily as needed.  . [DISCONTINUED] NP THYROID 15 MG tablet Take 15 mg by mouth daily.   No facility-administered encounter  medications on file as of 08/13/2020.    ALLERGIES: Allergies  Allergen  Reactions  . Zegerid [Omeprazole-Sodium Bicarbonate]     Irregular Heart Beat   VACCINATION STATUS:  There is no immunization history on file for this patient.   HPI    Tabitha Schmidt  is a patient with the above medical history. she was diagnosed hypothyroidism r/t Hashimoto's thyroiditis at approximate age of 38 years which required subsequnt initiation of thyroid hormone replacement. she was given various doses of Levothyroxine, Synthroid, NP thyroid, and Nature Thyroid over the years, currently on 90 mg of Nature Thyroid. she reports compliance to this medication:  Taking it daily on empty stomach with water, separated by >30 minutes before breakfast and other medications, and by at least 4 hours from calcium, iron, PPIs, multivitamins .  I reviewed patient's thyroid tests:  Lab Results  Component Value Date   TSH 0.152 (L) 05/13/2019   TSH 2.00 11/04/2015   TSH 1.81 07/07/2015   TSH 7.27 (H) 04/10/2015   TSH 21.29 (H) 01/30/2015   TSH 34.67 (H) 12/16/2014   FREET4 0.74 11/04/2015   FREET4 0.71 07/07/2015   FREET4 0.47 (L) 04/10/2015   FREET4 0.45 (L) 01/30/2015   FREET4 0.40 (L) 12/16/2014     Pt describes: - weight gain - fatigue - heat intolerance - dry skin - brittle nails - hair loss  Pt denies feeling nodules in neck, hoarseness, dysphagia/odynophagia, SOB with lying down.  She says these symptoms have gotten worse since her PCP reduced her dose of Nature thyroid recently.  she endorses a family history of thyroid disorders in her mother and sister (both hypothyroidism).  No family history of thyroid cancer. No history of  radiation therapy to head or neck.  No recent use of iodine supplements or Biotin supplements.  She reports she had an ultrasound of her thyroid years ago in which a small nodule was found.  She was sent to Dr. Elvera Lennox in [place] who did a follow up on the nodule and it was not present at that visit, requiring no further care.  I reviewed her  chart and she also has a history of MI, HTN, HLD, GERD, anxiety, COPD, fibromyalgia, osteoporosis, and diabetes.   ROS:  Constitutional: + weight gain, + fatigue, + subjective hyperthermia, no subjective hypothermia Eyes: no blurry vision, no xerophthalmia ENT: no sore throat, no nodules palpated in throat, no dysphagia/odynophagia, no hoarseness Cardiovascular: no Chest Pain, no Shortness of Breath, no palpitations, no leg swelling Respiratory: no cough, no SOB Gastrointestinal: no Nausea/Vomiting/Diarrhea Musculoskeletal: no muscle/joint aches Skin: no rashes Neurological: no tremors, no numbness, no tingling, no dizziness Psychiatric: no depression, no anxiety   OBJECTIVE:  Physical Exam: BP 117/70 (BP Location: Right Arm, Patient Position: Sitting)   Pulse 70   Ht 5\' 7"  (1.702 m)   Wt 183 lb (83 kg)   BMI 28.66 kg/m  Wt Readings from Last 3 Encounters:  08/13/20 183 lb (83 kg)  08/12/20 183 lb (83 kg)  03/24/20 175 lb 6.4 oz (79.6 kg)    BP Readings from Last 3 Encounters:  08/13/20 117/70  08/12/20 128/64  03/24/20 (!) 126/56    Constitutional:  Body mass index is 28.66 kg/m., not in acute distress, normal state of mind Eyes: PERRLA, EOMI, no exophthalmos ENT: moist mucous membranes, no thyromegaly, no cervical lymphadenopathy Cardiovascular: normal precordial activity, Regular Rate and Rhythm, no Murmur/Rubs/Gallops Respiratory:  adequate breathing efforts, no gross chest deformity, Clear to auscultation bilaterally  Gastrointestinal: abdomen soft, Non -tender, No distension, Bowel Sounds present Musculoskeletal: no gross deformities, strength intact in all four extremities Skin: moist, warm, no rashes Neurological: no tremor with outstretched hands, Deep tendon reflexes normal in all four extremities.   CMP ( most recent) CMP     Component Value Date/Time   NA 139 08/03/2019 0353   K 3.9 08/03/2019 0353   CL 108 08/03/2019 0353   CO2 23 08/03/2019 0353    GLUCOSE 126 (H) 08/03/2019 0353   BUN 11 08/03/2019 0353   CREATININE 0.69 08/03/2019 0353   CALCIUM 8.7 (L) 08/03/2019 0353   PROT 7.2 08/01/2019 1032   ALBUMIN 4.0 08/01/2019 1032   AST 22 08/01/2019 1032   ALT 26 08/01/2019 1032   ALKPHOS 101 08/01/2019 1032   BILITOT 0.6 08/01/2019 1032   GFRNONAA >60 08/03/2019 0353   GFRAA >60 08/03/2019 0353     Diabetic Labs (most recent): Lab Results  Component Value Date   HGBA1C 5.1 08/01/2019   HGBA1C 5.3 05/13/2019   HGBA1C 5.6 01/05/2016     Lipid Panel ( most recent) Lipid Panel     Component Value Date/Time   CHOL 181 05/14/2019 0129   TRIG 167 (H) 05/14/2019 0129   HDL 35 (L) 05/14/2019 0129   CHOLHDL 5.2 05/14/2019 0129   VLDL 33 05/14/2019 0129   LDLCALC 113 (H) 05/14/2019 0129       Lab Results  Component Value Date   TSH 0.152 (L) 05/13/2019   TSH 2.00 11/04/2015   TSH 1.81 07/07/2015   TSH 7.27 (H) 04/10/2015   TSH 21.29 (H) 01/30/2015   TSH 34.67 (H) 12/16/2014   FREET4 0.74 11/04/2015   FREET4 0.71 07/07/2015   FREET4 0.47 (L) 04/10/2015   FREET4 0.45 (L) 01/30/2015   FREET4 0.40 (L) 12/16/2014       ASSESSMENT / PLAN: 1. Hypothyroidism- s/p Hashimoto's Thyroiditis   Patient with long-standing hypothyroidism, on thyroid hormone replacement therapy. On physical exam , patient does not have gross goiter, thyroid nodules, or neck compression symptoms.  I discussed switching patient back to synthetic hormone Synthroid instead of Nature Thyroid due to its consistent dosing and efficacy predictability.  She is hesitant, saying the NP thyroid made her feel the best out of all the ones she has tried, but reluctantly agreed to give it a try.  I prescribed Synthroid 125 mcg po daily before breakfast.    - We discussed about correct intake of levothyroxine, at fasting, with water, separated by at least 30 minutes from breakfast, and separated by more than 4 hours from calcium, iron, multivitamins, acid  reflux medications (PPIs). -Patient is made aware of the fact that thyroid hormone replacement is needed for life, dose to be adjusted by periodic monitoring of thyroid function tests.  - Will check thyroid tests before next visit: TSH, free T4, free T3.  -Due to absence of clinical goiter, no need for thyroid ultrasound.    - Time spent with the patient: 45 minutes, of which >50% was spent in obtaining information about her symptoms, reviewing her previous labs, evaluations, and treatments, counseling her about her  hypothyroidism, and developing a plan to confirm the diagnosis and long term treatment as necessary. Please refer to " Patient Self Inventory" in the Media  tab for reviewed elements of pertinent patient history.  Constance Haw participated in the discussions, expressed understanding, and voiced agreement with the above plans.  All questions were answered to her satisfaction. she is  encouraged to contact clinic should she have any questions or concerns prior to her return visit.    FOLLOW UP PLAN: Return in about 6 weeks (around 09/24/2020) for Thyroid follow up, Previsit labs.  Ronny Bacon, [facility] [address] [place], [place] [address] Phone: [phone] Fax: [phone]  08/13/2020, 1:21 PM  This note was partially dictated with voice recognition software. Similar sounding words can be transcribed inadequately or may not  be corrected upon review.

## 2020-08-13 NOTE — Patient Instructions (Signed)

## 2020-08-17 ENCOUNTER — Institutional Professional Consult (permissible substitution): Payer: Medicare Other | Admitting: Pulmonary Disease

## 2020-08-22 DIAGNOSIS — J449 Chronic obstructive pulmonary disease, unspecified: Secondary | ICD-10-CM | POA: Diagnosis not present

## 2020-09-03 ENCOUNTER — Institutional Professional Consult (permissible substitution): Payer: Medicare Other | Admitting: Internal Medicine

## 2020-09-15 DIAGNOSIS — M9903 Segmental and somatic dysfunction of lumbar region: Secondary | ICD-10-CM | POA: Diagnosis not present

## 2020-09-15 DIAGNOSIS — S338XXA Sprain of other parts of lumbar spine and pelvis, initial encounter: Secondary | ICD-10-CM | POA: Diagnosis not present

## 2020-09-15 DIAGNOSIS — M7061 Trochanteric bursitis, right hip: Secondary | ICD-10-CM | POA: Diagnosis not present

## 2020-09-17 DIAGNOSIS — M9903 Segmental and somatic dysfunction of lumbar region: Secondary | ICD-10-CM | POA: Diagnosis not present

## 2020-09-17 DIAGNOSIS — M7061 Trochanteric bursitis, right hip: Secondary | ICD-10-CM | POA: Diagnosis not present

## 2020-09-17 DIAGNOSIS — S338XXA Sprain of other parts of lumbar spine and pelvis, initial encounter: Secondary | ICD-10-CM | POA: Diagnosis not present

## 2020-09-22 DIAGNOSIS — S338XXA Sprain of other parts of lumbar spine and pelvis, initial encounter: Secondary | ICD-10-CM | POA: Diagnosis not present

## 2020-09-22 DIAGNOSIS — M7061 Trochanteric bursitis, right hip: Secondary | ICD-10-CM | POA: Diagnosis not present

## 2020-09-22 DIAGNOSIS — M9903 Segmental and somatic dysfunction of lumbar region: Secondary | ICD-10-CM | POA: Diagnosis not present

## 2020-09-22 DIAGNOSIS — E039 Hypothyroidism, unspecified: Secondary | ICD-10-CM | POA: Diagnosis not present

## 2020-09-22 DIAGNOSIS — J449 Chronic obstructive pulmonary disease, unspecified: Secondary | ICD-10-CM | POA: Diagnosis not present

## 2020-09-23 LAB — T3, FREE: T3, Free: 2.8 pg/mL (ref 2.0–4.4)

## 2020-09-23 LAB — TSH: TSH: 0.325 u[IU]/mL — ABNORMAL LOW (ref 0.450–4.500)

## 2020-09-23 LAB — T4, FREE: Free T4: 1.56 ng/dL (ref 0.82–1.77)

## 2020-09-28 ENCOUNTER — Ambulatory Visit: Payer: Medicare Other | Admitting: Cardiovascular Disease

## 2020-09-28 DIAGNOSIS — M7061 Trochanteric bursitis, right hip: Secondary | ICD-10-CM | POA: Diagnosis not present

## 2020-09-28 DIAGNOSIS — M9903 Segmental and somatic dysfunction of lumbar region: Secondary | ICD-10-CM | POA: Diagnosis not present

## 2020-09-28 DIAGNOSIS — S338XXA Sprain of other parts of lumbar spine and pelvis, initial encounter: Secondary | ICD-10-CM | POA: Diagnosis not present

## 2020-09-30 ENCOUNTER — Encounter: Payer: Self-pay | Admitting: Nurse Practitioner

## 2020-09-30 ENCOUNTER — Other Ambulatory Visit: Payer: Self-pay | Admitting: Cardiology

## 2020-09-30 ENCOUNTER — Ambulatory Visit (INDEPENDENT_AMBULATORY_CARE_PROVIDER_SITE_OTHER): Payer: Medicare Other | Admitting: Nurse Practitioner

## 2020-09-30 ENCOUNTER — Other Ambulatory Visit: Payer: Self-pay

## 2020-09-30 VITALS — BP 145/74 | HR 76 | Ht 67.0 in | Wt 190.0 lb

## 2020-09-30 DIAGNOSIS — E039 Hypothyroidism, unspecified: Secondary | ICD-10-CM | POA: Diagnosis not present

## 2020-09-30 MED ORDER — TIROSINT 125 MCG PO CAPS: 125.0000 ug | ORAL_CAPSULE | Freq: Every day | ORAL | 3 refills | Status: DC

## 2020-09-30 NOTE — Patient Instructions (Signed)
Hypothyroidism  Hypothyroidism is when the thyroid gland does not make enough of certain hormones (it is underactive). The thyroid gland is a small gland located in the lower front part of the neck, just in front of the windpipe (trachea). This gland makes hormones that help control how the body uses food for energy (metabolism) as well as how the heart and brain function. These hormones also play a role in keeping your bones strong. When the thyroid is underactive, it produces too little of the hormones thyroxine (T4) and triiodothyronine (T3). What are the causes? This condition may be caused by:  Hashimoto's disease. This is a disease in which the body's disease-fighting system (immune system) attacks the thyroid gland. This is the most common cause.  Viral infections.  Pregnancy.  Certain medicines.  Birth defects.  Past radiation treatments to the head or neck for cancer.  Past treatment with radioactive iodine.  Past exposure to radiation in the environment.  Past surgical removal of part or all of the thyroid.  Problems with a gland in the center of the brain (pituitary gland).  Lack of enough iodine in the diet. What increases the risk? You are more likely to develop this condition if:  You are female.  You have a family history of thyroid conditions.  You use a medicine called lithium.  You take medicines that affect the immune system (immunosuppressants). What are the signs or symptoms? Symptoms of this condition include:  Feeling as though you have no energy (lethargy).  Not being able to tolerate cold.  Weight gain that is not explained by a change in diet or exercise habits.  Lack of appetite.  Dry skin.  Coarse hair.  Menstrual irregularity.  Slowing of thought processes.  Constipation.  Sadness or depression. How is this diagnosed? This condition may be diagnosed based on:  Your symptoms, your medical history, and a physical exam.  Blood  tests. You may also have imaging tests, such as an ultrasound or MRI. How is this treated? This condition is treated with medicine that replaces the thyroid hormones that your body does not make. After you begin treatment, it may take several weeks for symptoms to go away. Follow these instructions at home:  Take over-the-counter and prescription medicines only as told by your health care provider.  If you start taking any new medicines, tell your health care provider.  Keep all follow-up visits as told by your health care provider. This is important. ? As your condition improves, your dosage of thyroid hormone medicine may change. ? You will need to have blood tests regularly so that your health care provider can monitor your condition. Contact a health care provider if:  Your symptoms do not get better with treatment.  You are taking thyroid replacement medicine and you: ? Sweat a lot. ? Have tremors. ? Feel anxious. ? Lose weight rapidly. ? Cannot tolerate heat. ? Have emotional swings. ? Have diarrhea. ? Feel weak. Get help right away if you have:  Chest pain.  An irregular heartbeat.  A rapid heartbeat.  Difficulty breathing. Summary  Hypothyroidism is when the thyroid gland does not make enough of certain hormones (it is underactive).  When the thyroid is underactive, it produces too little of the hormones thyroxine (T4) and triiodothyronine (T3).  The most common cause is Hashimoto's disease, a disease in which the body's disease-fighting system (immune system) attacks the thyroid gland. The condition can also be caused by viral infections, medicine, pregnancy, or past   radiation treatment to the head or neck.  Symptoms may include weight gain, dry skin, constipation, feeling as though you do not have energy, and not being able to tolerate cold.  This condition is treated with medicine to replace the thyroid hormones that your body does not make. This information  is not intended to replace advice given to you by your health care provider. Make sure you discuss any questions you have with your health care provider. Document Revised: 09/29/2017 Document Reviewed: 09/27/2017 Elsevier Patient Education  2020 [employer]  

## 2020-09-30 NOTE — Progress Notes (Signed)
Endocrinology Follow Up Visit                                        09/30/2020, 9:51 AM  SUBJECTIVE:  Tabitha Schmidt is a 64 y.o.-year-old female patient being seen in follow up after being seen in consultation for hypothyroidism referred by [name], MD.   Past Medical History:  Diagnosis Date  . Anxiety   . Arthritis   . Asthma   . Cataracts, bilateral    [name]  . Complication of anesthesia    Note: pt has hx of TMJ " every now and then my jaw will lock "  . COPD (chronic obstructive pulmonary disease) (HCC)   . Coronary artery disease    a. s/p DES to mid-RCA in 05/2019  . DM (diabetes mellitus) (HCC)    "came off medicines with dr's permission"  . Family history of adverse reaction to anesthesia    " my son woke up crying "  . Fatty liver   . Fibromyalgia   . GERD (gastroesophageal reflux disease)   . Hyperlipidemia    stopped meds on her own  . Hypothyroid   . Myocardial infarction (HCC)   . On supplemental oxygen therapy    at night and PRN  . Pneumonia   . Shortness of breath   . Stenosis of right carotid artery   . TMJ (dislocation of temporomandibular joint)     Past Surgical History:  Procedure Laterality Date  . ABDOMINAL HYSTERECTOMY    . BREAST SURGERY     removal of bilateral benign tumors  . CARDIAC CATHETERIZATION     2010  . CHOLECYSTECTOMY    . CORONARY STENT INTERVENTION N/A 05/18/2019   Procedure: CORONARY STENT INTERVENTION;  Surgeon: Runell Gess, MD;  Location: [facility];  Service: Cardiovascular;  Laterality: N/A;  . DILATION AND CURETTAGE OF UTERUS    . ESOPHAGOGASTRODUODENOSCOPY  01/26/2012   Procedure: ESOPHAGOGASTRODUODENOSCOPY (EGD);  Surgeon: Malissa Hippo, MD;  Location: [facility];  Service: Endoscopy;  Laterality: N/A;  730  . LEFT HEART CATH AND CORONARY ANGIOGRAPHY N/A 05/18/2019   Procedure: LEFT HEART CATH AND CORONARY ANGIOGRAPHY;   Surgeon: Runell Gess, MD;  Location: [facility];  Service: Cardiovascular;  Laterality: N/A;  . TRANSCAROTID ARTERY REVASCULARIZATION Right 08/02/2019   Procedure: TRANSCAROTID ARTERY REVASCULARIZATION RIGHT;  Surgeon: Nada Libman, MD;  Location: [facility] OR;  Service: Vascular;  Laterality: Right;  . TUBAL LIGATION      Social History   Socioeconomic History  . Marital status: Single    Spouse name: Not on file  . Number of children: Not on file  . Years of education: Not on file  . Highest education level: Not on file  Occupational History  . Not on file  Tobacco Use  . Smoking status: Current Every Day Smoker    Packs/day: 0.50    Years: 40.00    Pack years: 20.00    Types: Cigarettes  .  Smokeless tobacco: Never Used  Vaping Use  . Vaping Use: Never used  Substance and Sexual Activity  . Alcohol use: No  . Drug use: No    Types: Cocaine  . Sexual activity: Yes    Birth control/protection: Surgical  Other Topics Concern  . Not on file  Social History Narrative  . Not on file   Social Determinants of Health   Financial Resource Strain:   . Difficulty of Paying Living Expenses: Not on file  Food Insecurity:   . Worried About Programme researcher, broadcasting/film/video in the Last Year: Not on file  . Ran Out of Food in the Last Year: Not on file  Transportation Needs:   . Lack of Transportation (Medical): Not on file  . Lack of Transportation (Non-Medical): Not on file  Physical Activity:   . Days of Exercise per Week: Not on file  . Minutes of Exercise per Session: Not on file  Stress:   . Feeling of Stress : Not on file  Social Connections:   . Frequency of Communication with Friends and Family: Not on file  . Frequency of Social Gatherings with Friends and Family: Not on file  . Attends Religious Services: Not on file  . Active Member of Clubs or Organizations: Not on file  . Attends Banker Meetings: Not on file  . Marital Status: Not on file     Family History  Problem Relation Age of Onset  . CAD Father   . Anesthesia problems Neg Hx   . Hypotension Neg Hx   . Malignant hyperthermia Neg Hx   . Pseudochol deficiency Neg Hx     Outpatient Encounter Medications as of 09/30/2020  Medication Sig  . aspirin EC 81 MG EC tablet Take 1 tablet (81 mg total) by mouth daily.  Marland Kitchen atorvastatin (LIPITOR) 80 MG tablet Take 1 tablet (80 mg total) by mouth daily at 6 PM.  . BREZTRI AEROSPHERE 160-9-4.8 MCG/ACT AERO INHALE (2) PUFFS TWICEODAILY FOR COPD RINSE MOUTH AFTER INHALATION.  Marland Kitchen LORazepam (ATIVAN) 0.5 MG tablet Take 0.5 mg by mouth 4 (four) times daily as needed.  . meclizine (ANTIVERT) 25 MG tablet Take 25 mg by mouth every 6 (six) hours.  . metoprolol tartrate (LOPRESSOR) 25 MG tablet Take 0.5 tablets (12.5 mg total) by mouth 2 (two) times daily.  . mirtazapine (REMERON) 7.5 MG tablet Take 7.5 mg by mouth at bedtime.  Marland Kitchen NALTREXONE HCL PO Take 4.5 mg by mouth every morning.  Marland Kitchen PROAIR HFA 108 (90 Base) MCG/ACT inhaler Inhale 1 puff into the lungs every 6 (six) hours as needed for wheezing or shortness of breath.   . [DISCONTINUED] levothyroxine (SYNTHROID) 125 MCG tablet Take 1 tablet (125 mcg total) by mouth daily. Needs branded synthroid  . TIROSINT 125 MCG CAPS Take 1 capsule (125 mcg total) by mouth daily before breakfast.   No facility-administered encounter medications on file as of 09/30/2020.    ALLERGIES: Allergies  Allergen Reactions  . Zegerid [Omeprazole-Sodium Bicarbonate]     Irregular Heart Beat   VACCINATION STATUS:  There is no immunization history on file for this patient.   HPI  Thyroid Problem Presents for follow-up visit. Symptoms include constipation, depressed mood, fatigue, heat intolerance and weight gain. Patient reports no anxiety, cold intolerance, hair loss, nail problem, palpitations, tremors or weight loss. The symptoms have been worsening.    Tabitha Schmidt  is a patient with the above medical  history. she was diagnosed hypothyroidism  r/t Hashimoto's thyroiditis at approximate age of 38 years which required subsequnt initiation of thyroid hormone replacement. she was given various doses of Levothyroxine, Synthroid, NP thyroid, and Nature Thyroid over the years. she reports compliance to this medication:  Taking it daily on empty stomach with water, separated by >30 minutes before breakfast and other medications, and by at least 4 hours from calcium, iron, PPIs, multivitamins .  I reviewed patient's thyroid tests:  Lab Results  Component Value Date   TSH 0.325 (L) 09/22/2020   TSH 0.152 (L) 05/13/2019   TSH 2.00 11/04/2015   TSH 1.81 07/07/2015   TSH 7.27 (H) 04/10/2015   TSH 21.29 (H) 01/30/2015   TSH 34.67 (H) 12/16/2014   FREET4 1.56 09/22/2020   FREET4 0.74 11/04/2015   FREET4 0.71 07/07/2015   FREET4 0.47 (L) 04/10/2015   FREET4 0.45 (L) 01/30/2015   FREET4 0.40 (L) 12/16/2014     Pt describes: - weight gain- continued - fatigue- worsening - heat intolerance- continued - dry skin - brittle nails - hair loss - constipation - sleep disruption - myalgias/arthralgias  Pt denies feeling nodules in neck, hoarseness, dysphagia/odynophagia, SOB with lying down.  She says these symptoms have gotten worse since her PCP reduced her dose of Nature thyroid recently.  she endorses a family history of thyroid disorders in her mother and sister (both hypothyroidism).  No family history of thyroid cancer. No history of  radiation therapy to head or neck.  No recent use of iodine supplements or Biotin supplements.  She reports she had an ultrasound of her thyroid years ago in which a small nodule was found.  She was sent to Dr. Elvera Lennox in [place] who did a follow up on the nodule and it was not present at that visit, requiring no further care.  I reviewed her chart and she also has a history of MI, HTN, HLD, GERD, anxiety, COPD, fibromyalgia, osteoporosis, and  diabetes.   ROS:  Constitutional: + weight gain, + fatigue, + subjective hyperthermia, no subjective hypothermia Eyes: no blurry vision, no xerophthalmia ENT: no sore throat, no nodules palpated in throat, no dysphagia/odynophagia, no hoarseness Cardiovascular: no Chest Pain, no Shortness of Breath, no palpitations, no leg swelling Respiratory: no cough, no SOB Gastrointestinal: no Nausea/Vomiting/Diarrhea, + constipation and bloating Musculoskeletal: + muscle/joint aches Skin: no rashes, + dry skin Neurological: no tremors, no numbness, no tingling, no dizziness Psychiatric: + depression, no anxiety, + sleep disturbance   OBJECTIVE:   BP (!) 145/74 (BP Location: Left Arm, Patient Position: Sitting)   Pulse 76   Ht 5\' 7"  (1.702 m)   Wt 190 lb (86.2 kg)   BMI 29.76 kg/m  Wt Readings from Last 3 Encounters:  09/30/20 190 lb (86.2 kg)  08/13/20 183 lb (83 kg)  08/12/20 183 lb (83 kg)    BP Readings from Last 3 Encounters:  09/30/20 (!) 145/74  08/13/20 117/70  08/12/20 128/64    Constitutional:  Body mass index is 29.76 kg/m., not in acute distress, depressed, frustrated state of mind Eyes: PERRLA, EOMI, no exophthalmos ENT: moist mucous membranes, no thyromegaly, no cervical lymphadenopathy Cardiovascular: normal precordial activity, Regular Rate and Rhythm, no Murmur/Rubs/Gallops Respiratory:  adequate breathing efforts, no gross chest deformity, Clear to auscultation bilaterally Gastrointestinal: abdomen soft, Non -tender, No distension, Bowel Sounds present Musculoskeletal: no gross deformities, strength intact in all four extremities Skin: moist, warm, no rashes Neurological: no tremor with outstretched hands, Deep tendon reflexes normal in all four extremities.  CMP ( most recent) CMP     Component Value Date/Time   NA 139 08/03/2019 0353   K 3.9 08/03/2019 0353   CL 108 08/03/2019 0353   CO2 23 08/03/2019 0353   GLUCOSE 126 (H) 08/03/2019 0353   BUN 11  08/03/2019 0353   CREATININE 0.69 08/03/2019 0353   CALCIUM 8.7 (L) 08/03/2019 0353   PROT 7.2 08/01/2019 1032   ALBUMIN 4.0 08/01/2019 1032   AST 22 08/01/2019 1032   ALT 26 08/01/2019 1032   ALKPHOS 101 08/01/2019 1032   BILITOT 0.6 08/01/2019 1032   GFRNONAA >60 08/03/2019 0353   GFRAA >60 08/03/2019 0353     Diabetic Labs (most recent): Lab Results  Component Value Date   HGBA1C 5.1 08/01/2019   HGBA1C 5.3 05/13/2019   HGBA1C 5.6 01/05/2016     Lipid Panel ( most recent) Lipid Panel     Component Value Date/Time   CHOL 181 05/14/2019 0129   TRIG 167 (H) 05/14/2019 0129   HDL 35 (L) 05/14/2019 0129   CHOLHDL 5.2 05/14/2019 0129   VLDL 33 05/14/2019 0129   LDLCALC 113 (H) 05/14/2019 0129       Lab Results  Component Value Date   TSH 0.325 (L) 09/22/2020   TSH 0.152 (L) 05/13/2019   TSH 2.00 11/04/2015   TSH 1.81 07/07/2015   TSH 7.27 (H) 04/10/2015   TSH 21.29 (H) 01/30/2015   TSH 34.67 (H) 12/16/2014   FREET4 1.56 09/22/2020   FREET4 0.74 11/04/2015   FREET4 0.71 07/07/2015   FREET4 0.47 (L) 04/10/2015   FREET4 0.45 (L) 01/30/2015   FREET4 0.40 (L) 12/16/2014       ASSESSMENT / PLAN: 1. Hypothyroidism- s/p Hashimoto's Thyroiditis   Patient with long-standing hypothyroidism, on thyroid hormone replacement therapy. On physical exam , patient does not have gross goiter, thyroid nodules, or neck compression symptoms.  Her previsit thyroid function tests indicate appropriate hormone replacement.  However, her symptoms have continued to worsen.  I discussed switching her back to NP thyroid vs initiating Tirosint.  She wishes to try Tirosint.  I prescribed Tirosint at same dose 125 mcg po daily before breakfast.  If her symptoms do not improve on this, she will be switched back to NP thyroid.  I discussed that her symptoms are vague and can be caused by other ailments other than thyroid imbalance.  She is encouraged to reach back out to PCP to discuss  possibility of uncontrolled depression or sleep apnea as potential causes for her symptoms.  She knows that Ashby Dawes Thyroid is not preferable due to its inconsistent dosing and efficacy predictability.    - We discussed about correct intake of levothyroxine, at fasting, with water, separated by at least 30 minutes from breakfast, and separated by more than 4 hours from calcium, iron, multivitamins, acid reflux medications (PPIs). -Patient is made aware of the fact that thyroid hormone replacement is needed for life, dose to be adjusted by periodic monitoring of thyroid function tests.  - Will check thyroid tests before next visit: TSH, free T4.         - Time spent on this patient care encounter:  25 minutes of which 50% was spent in  counseling and the rest reviewing  her current and  previous labs / studies and medications  doses and developing a plan for long term care. Constance Haw  participated in the discussions, expressed understanding, and voiced agreement with the above plans.  All questions were  answered to her satisfaction. she is encouraged to contact clinic should she have any questions or concerns prior to her return visit.    FOLLOW UP PLAN: Return in about 8 weeks (around 11/25/2020) for Thyroid follow up, Previsit labs.  Ronny Bacon, [facility] [address] [place], [place] [address] Phone: [phone] Fax: [phone]  09/30/2020, 9:51 AM

## 2020-10-01 DIAGNOSIS — M7061 Trochanteric bursitis, right hip: Secondary | ICD-10-CM | POA: Diagnosis not present

## 2020-10-01 DIAGNOSIS — S338XXA Sprain of other parts of lumbar spine and pelvis, initial encounter: Secondary | ICD-10-CM | POA: Diagnosis not present

## 2020-10-01 DIAGNOSIS — M9903 Segmental and somatic dysfunction of lumbar region: Secondary | ICD-10-CM | POA: Diagnosis not present

## 2020-10-05 DIAGNOSIS — M9903 Segmental and somatic dysfunction of lumbar region: Secondary | ICD-10-CM | POA: Diagnosis not present

## 2020-10-05 DIAGNOSIS — S338XXA Sprain of other parts of lumbar spine and pelvis, initial encounter: Secondary | ICD-10-CM | POA: Diagnosis not present

## 2020-10-05 DIAGNOSIS — M7061 Trochanteric bursitis, right hip: Secondary | ICD-10-CM | POA: Diagnosis not present

## 2020-10-22 DIAGNOSIS — J449 Chronic obstructive pulmonary disease, unspecified: Secondary | ICD-10-CM | POA: Diagnosis not present

## 2020-11-04 ENCOUNTER — Telehealth: Payer: Self-pay | Admitting: Cardiovascular Disease

## 2020-11-04 DIAGNOSIS — G4700 Insomnia, unspecified: Secondary | ICD-10-CM | POA: Diagnosis not present

## 2020-11-04 DIAGNOSIS — J449 Chronic obstructive pulmonary disease, unspecified: Secondary | ICD-10-CM | POA: Diagnosis not present

## 2020-11-04 DIAGNOSIS — I10 Essential (primary) hypertension: Secondary | ICD-10-CM | POA: Diagnosis not present

## 2020-11-04 DIAGNOSIS — S39092S Other injury of muscle, fascia and tendon of lower back, sequela: Secondary | ICD-10-CM | POA: Diagnosis not present

## 2020-11-04 DIAGNOSIS — R002 Palpitations: Secondary | ICD-10-CM | POA: Diagnosis not present

## 2020-11-04 DIAGNOSIS — M130 Polyarthritis, unspecified: Secondary | ICD-10-CM | POA: Diagnosis not present

## 2020-11-04 DIAGNOSIS — Z0001 Encounter for general adult medical examination with abnormal findings: Secondary | ICD-10-CM | POA: Diagnosis not present

## 2020-11-04 DIAGNOSIS — E039 Hypothyroidism, unspecified: Secondary | ICD-10-CM | POA: Diagnosis not present

## 2020-11-04 NOTE — Telephone Encounter (Signed)
Tabitha Schmidt is calling stating her PCP requested she follow up with a Cardiologist in the next week or so. [name] is a previous patient of Dr. [name], and is wanting to continue care with Dr. Eden Emms due to her son seeing him. She is requesting a work in if possible. I offered her a virtual for this Friday, but she is wanting to be seen in office and does not want to see a PA. Please advise.

## 2020-11-04 NOTE — Telephone Encounter (Signed)
Attempted to call the patient, continuous ringing with no answer and no option to leave a message. Will route to Dr. Fabio Bering RN for follow-up if needed.

## 2020-11-05 LAB — TSH: TSH: 0.2 — AB (ref 0.41–5.90)

## 2020-11-05 NOTE — Telephone Encounter (Signed)
This is a [place] patient will send to [facility] to see if they can get her in to see Dr. Eden Emms is [place].

## 2020-11-06 NOTE — Telephone Encounter (Signed)
Spoke with pt who reports palpitation on and off all day long. Denies having increased SOB. Pt does report that one week ago she did have some chest pain and took NTG. No recent chest pain. Pt started new thyroid  med. The beginning of December. She wishes to only see Dr. Eden Emms at this time. Pt is ok with being seen in [place]. Will schedule next available.

## 2020-11-10 ENCOUNTER — Telehealth: Payer: Self-pay

## 2020-11-10 DIAGNOSIS — E559 Vitamin D deficiency, unspecified: Secondary | ICD-10-CM

## 2020-11-10 NOTE — Telephone Encounter (Signed)
Patient notified of instructions verbalized understanding

## 2020-11-10 NOTE — Telephone Encounter (Signed)
Her labs are consistent with slight over-replacement which could be contributing to her palpitations (but because palpitations are a nonspecific symptom, it is difficult to tell if that is the cause alone).  Did she feel any better after changing to Tirosint?  If so, we can reduce the dose just a bit to help prevent side effects of having too much thyroid hormone in the body.  If not, we can change her back to NP Thyroid if she wishes (we discussed changing back at last visit).

## 2020-11-10 NOTE — Telephone Encounter (Signed)
Patient had been having palpitations, went to pcp and did labs, they increased her Lopressor to 25mg  BID and suggested she call you to discuss her thyroid medications based off the labs that were drawn, she has NP Thyroid still at home in 90mg  and 15 mg, patient is wondering if you would like her to change back to that and then recheck labs in 4 weeks, stated palpitations are improved since changes to lopressor, still more exhausted than she was prior to her visit here. Please advise.

## 2020-11-10 NOTE — Telephone Encounter (Signed)
Patient is very hesitant on taking 135mg  of thyroid, due to past experience, did take 113mg  and felt better but TSH was still low.

## 2020-11-10 NOTE — Telephone Encounter (Signed)
Patient is asking for a return call regarding some recent labs she had done at Dr Scharlene Gloss office. Please advise. I have the labs.

## 2020-11-10 NOTE — Telephone Encounter (Signed)
Ok, lets try this... lets have her take the NP thyroid one 90 mcg.  This dose will probably not be enough and we may need to change after the 6 weeks, but at least it will give [place] a good starting point.

## 2020-11-20 NOTE — Progress Notes (Deleted)
HPI:  65 y.o. new to me Seen by Dr Kirtland Bouchard previously Had MI after right carotid stenting 08/02/19 DES to RCA Has chronic Dyspnea from years of smoking Stress from 67 yo adopted daughter lives with her and has bipolar disease Called Office 11/06/20 with some tightness in chest and dyspnea Also having palpitations Compliant with meds Has nitro On statin and ASA.  Able to tolerate beta blocker with COPD has inhaler She has hypothyroidism and her TSH has been suppressed Replacement dose decreased 11/10/20 by NP.  ***     Review of Systems: As per "subjective", otherwise negative.  Allergies  Allergen Reactions  . Zegerid [Omeprazole-Sodium Bicarbonate]     Irregular Heart Beat    Current Outpatient Medications  Medication Sig Dispense Refill  . aspirin EC 81 MG EC tablet Take 1 tablet (81 mg total) by mouth daily. 30 tablet 3  . atorvastatin (LIPITOR) 80 MG tablet TAKE ONE TABLET BY MOUTH AT 6PM. 90 tablet 0  . BREZTRI AEROSPHERE 160-9-4.8 MCG/ACT AERO INHALE (2) PUFFS TWICEODAILY FOR COPD RINSE MOUTH AFTER INHALATION.    Marland Kitchen LORazepam (ATIVAN) 0.5 MG tablet Take 0.5 mg by mouth 4 (four) times daily as needed.    . meclizine (ANTIVERT) 25 MG tablet Take 25 mg by mouth every 6 (six) hours.    . metoprolol tartrate (LOPRESSOR) 25 MG tablet Take 0.5 tablets (12.5 mg total) by mouth 2 (two) times daily. 30 tablet 10  . mirtazapine (REMERON) 7.5 MG tablet Take 7.5 mg by mouth at bedtime.    Marland Kitchen NALTREXONE HCL PO Take 4.5 mg by mouth every morning.    Marland Kitchen PROAIR HFA 108 (90 Base) MCG/ACT inhaler Inhale 1 puff into the lungs every 6 (six) hours as needed for wheezing or shortness of breath.     Lavonia Drafts 125 MCG CAPS Take 1 capsule (125 mcg total) by mouth daily before breakfast. 90 capsule 3   No current facility-administered medications for this visit.    Past Medical History:  Diagnosis Date  . Anxiety   . Arthritis   . Asthma   . Cataracts, bilateral    [name]  . Complication of  anesthesia    Note: pt has hx of TMJ " every now and then my jaw will lock "  . COPD (chronic obstructive pulmonary disease) (HCC)   . Coronary artery disease    a. s/p DES to mid-RCA in 05/2019  . DM (diabetes mellitus) (HCC)    "came off medicines with dr's permission"  . Family history of adverse reaction to anesthesia    " my son woke up crying "  . Fatty liver   . Fibromyalgia   . GERD (gastroesophageal reflux disease)   . Hyperlipidemia    stopped meds on her own  . Hypothyroid   . Myocardial infarction (HCC)   . On supplemental oxygen therapy    at night and PRN  . Pneumonia   . Shortness of breath   . Stenosis of right carotid artery   . TMJ (dislocation of temporomandibular joint)     Past Surgical History:  Procedure Laterality Date  . ABDOMINAL HYSTERECTOMY    . BREAST SURGERY     removal of bilateral benign tumors  . CARDIAC CATHETERIZATION     2010  . CHOLECYSTECTOMY    . CORONARY STENT INTERVENTION N/A 05/18/2019   Procedure: CORONARY STENT INTERVENTION;  Surgeon: Runell Gess, MD;  [facility];  Service: Cardiovascular;  Laterality: N/A;  . DILATION AND CURETTAGE OF UTERUS    . ESOPHAGOGASTRODUODENOSCOPY  01/26/2012   Procedure: ESOPHAGOGASTRODUODENOSCOPY (EGD);  Surgeon: Malissa Hippo, MD;  [facility]: [facility];  Service: Endoscopy;  Laterality: N/A;  730  . LEFT HEART CATH AND CORONARY ANGIOGRAPHY N/A 05/18/2019   Procedure: LEFT HEART CATH AND CORONARY ANGIOGRAPHY;  Surgeon: Runell Gess, MD;  [facility]: [facility];  Service: Cardiovascular;  Laterality: N/A;  . TRANSCAROTID ARTERY REVASCULARIZATION Right 08/02/2019   Procedure: TRANSCAROTID ARTERY REVASCULARIZATION RIGHT;  Surgeon: Nada Libman, MD;  Location: [facility];  Service: Vascular;  Laterality: Right;  . TUBAL LIGATION      Social History   Socioeconomic History  . Marital status: Single    Spouse name: Not on file  . Number of children: Not on file  .  Years of education: Not on file  . Highest education level: Not on file  Occupational History  . Not on file  Tobacco Use  . Smoking status: Current Every Day Smoker    Packs/day: 0.50    Years: 40.00    Pack years: 20.00    Types: Cigarettes  . Smokeless tobacco: Never Used  Vaping Use  . Vaping Use: Never used  Substance and Sexual Activity  . Alcohol use: No  . Drug use: No    Types: Cocaine  . Sexual activity: Yes    Birth control/protection: Surgical  Other Topics Concern  . Not on file  Social History Narrative  . Not on file   Social Determinants of Health   Financial Resource Strain: Not on file  Food Insecurity: Not on file  Transportation Needs: Not on file  Physical Activity: Not on file  Stress: Not on file  Social Connections: Not on file  Intimate Partner Violence: Not on file   Marlyn Corporal, RN was present throughout the entirety of the encounter.  There were no vitals filed for this visit.  Wt Readings from Last 3 Encounters:  09/30/20 86.2 kg  08/13/20 83 kg  08/12/20 83 kg     PHYSICAL EXAM There were no vitals taken for this visit.  Affect appropriate Chronically ill COPDer  HEENT: normal Neck supple with no adenopathy JVP normal no bruits no thyromegaly Lungs clear with no wheezing and good diaphragmatic motion Heart:  S1/S2 no murmur, no rub, gallop or click PMI normal Abdomen: benighn, BS positve, no tenderness, no AAA no bruit.  No HSM or HJR Distal pulses intact with no bruits No edema Neuro non-focal Skin warm and dry No muscular weakness    Labs: Lab Results  Component Value Date/Time   K 3.9 08/03/2019 03:53 AM   BUN 11 08/03/2019 03:53 AM   CREATININE 0.69 08/03/2019 03:53 AM   ALT 26 08/01/2019 10:32 AM   TSH 0.20 (A) 11/04/2020 12:00 AM   TSH 0.325 (L) 09/22/2020 12:19 PM   HGB 11.8 (L) 08/03/2019 03:53 AM     Lipids: Lab Results  Component Value Date/Time   LDLCALC 113 (H) 05/14/2019 01:29 AM   CHOL  181 05/14/2019 01:29 AM   TRIG 167 (H) 05/14/2019 01:29 AM   HDL 35 (L) 05/14/2019 01:29 AM       Additional studies/ records that were reviewed today include:   Echocardiogram: 05/13/2019 IMPRESSIONS   1. The left ventricle has normal systolic function with an ejection fraction of 60-65%. The cavity size was normal. There is mild concentric left ventricular hypertrophy. Left ventricular diastolic Doppler parameters are  consistent with impaired  relaxation. No evidence of left ventricular regional wall motion abnormalities. 2. The right ventricle has normal systolic function. The cavity was normal. There is no increase in right ventricular wall thickness. 3. The mitral valve is grossly normal. Mild thickening of the mitral valve leaflet. 4. The tricuspid valve is grossly normal. 5. The aortic valve is tricuspid. 6. The aortic root is normal in size and structure.  NST: 05/14/2019  This is a low risk study.  The left ventricular ejection fraction is hyperdynamic (>65%).  Small inferior wall defect from apex to base. No ischemia. Normal functional images with EF 72% No RWMA. Most likely related to motion artifact and diaphragmatic attenuation Low risk study  Cardiac Catheterization: 05/18/2019  Mid RCA lesion is 100% stenosed.  A drug-eluting stent was successfully placed.  Post intervention, there is a 0% residual stenosis.  There is mild left ventricular systolic dysfunction.  LV end diastolic pressure is mildly elevated.  The left ventricular ejection fraction is 50-55% by visual estimate.  IMPRESSION:Successful PCI and drug-eluting stent of a dominant mid RCA occlusion with grade 1 left-to-right collaterals in the setting of non-STEMI and ongoing chest pain despite IV heparin and nitroglycerin. Her left system was free of disease. Her EF was preserved in the 50% range with moderate inferior hypokinesia. She will need uninterrupted dual antiplatelet therapy  for 12 months. Cardiac risk factor modification including smoking cessation, high-dose statin therapy. The patient can most likely be discharged home tomorrow.   ASSESSMENT AND PLAN: 1.  Coronary artery disease: Status post drug-eluting stent placement for 100% mid RCA stenosis in the context of non-STEMI in July 2020. ***  2.  Hyperlipidemia: Continue atorvastatin.  Labs with primary target LDL <70   3.  Tobacco use: Previously prescribed Chantix.   4.  Bilateral internal carotid artery stenosis: She underwent right transcarotid revascularization/stenting on 08/02/2019.  Continue statin therapy. LICA 40-59% will update duplex   5.  COPD/Smoking :  Counseled on cessation for <10 minutes Needs lung cancer screening CT   6. Thyroid:  Recent TSH suppressed ***   Lexiscan myovue Carotid duplex  Lung cancer screening CT   Disposition: Follow up 6 months   Prentice Docker, M.D., F.A.C.C.

## 2020-11-22 DIAGNOSIS — J449 Chronic obstructive pulmonary disease, unspecified: Secondary | ICD-10-CM | POA: Diagnosis not present

## 2020-11-26 ENCOUNTER — Ambulatory Visit: Payer: Medicare Other | Admitting: Nurse Practitioner

## 2020-11-27 ENCOUNTER — Ambulatory Visit: Payer: Medicare Other | Admitting: Cardiovascular Disease

## 2020-12-23 DIAGNOSIS — J449 Chronic obstructive pulmonary disease, unspecified: Secondary | ICD-10-CM | POA: Diagnosis not present

## 2020-12-24 NOTE — Telephone Encounter (Signed)
Whitney can you order the Vit d for [facility], Ladona Ridgel she wants to cancel her appt and she will call back once she does labs to get scheduled appt.

## 2020-12-24 NOTE — Telephone Encounter (Signed)
Pt called in to cancel her appt for tomorrow because she says she has not done her labs. The notes say she has. Pt said she was told to redo her labs after being on the medicine for 6 weeks and that is what she has not redone yet. Does patient need to redo those labs before she comes back in for an appt? If so patient states she wants her Vit D and B checked at that time as well. Pt would like a call back [phone]

## 2020-12-24 NOTE — Telephone Encounter (Signed)
Done, I added the Vitamin D level

## 2020-12-24 NOTE — Telephone Encounter (Signed)
Please advise 

## 2020-12-24 NOTE — Telephone Encounter (Signed)
We changed her medication in between last visit back to NP thyroid.  We should wait 6 weeks for her medication to have affect on her labs. It has been about 6 weeks or so since last blood test, so it just depends on when she actually started taking the other medication.  As for checking her Vitamin D and B, I dont mind adding the vitamin D on, but B12 is not something I treat, therefore that one would need to be ordered by her PCP or other provider.

## 2020-12-25 ENCOUNTER — Ambulatory Visit: Payer: Medicare Other | Admitting: Nurse Practitioner

## 2020-12-28 ENCOUNTER — Other Ambulatory Visit: Payer: Self-pay | Admitting: Cardiology

## 2021-01-20 DIAGNOSIS — J449 Chronic obstructive pulmonary disease, unspecified: Secondary | ICD-10-CM | POA: Diagnosis not present

## 2021-01-21 DIAGNOSIS — I10 Essential (primary) hypertension: Secondary | ICD-10-CM | POA: Diagnosis not present

## 2021-01-21 DIAGNOSIS — Z0001 Encounter for general adult medical examination with abnormal findings: Secondary | ICD-10-CM | POA: Diagnosis not present

## 2021-01-21 DIAGNOSIS — E785 Hyperlipidemia, unspecified: Secondary | ICD-10-CM | POA: Diagnosis not present

## 2021-01-21 DIAGNOSIS — J449 Chronic obstructive pulmonary disease, unspecified: Secondary | ICD-10-CM | POA: Diagnosis not present

## 2021-01-21 DIAGNOSIS — S39092S Other injury of muscle, fascia and tendon of lower back, sequela: Secondary | ICD-10-CM | POA: Diagnosis not present

## 2021-01-21 DIAGNOSIS — R799 Abnormal finding of blood chemistry, unspecified: Secondary | ICD-10-CM | POA: Diagnosis not present

## 2021-01-25 DIAGNOSIS — M130 Polyarthritis, unspecified: Secondary | ICD-10-CM | POA: Diagnosis not present

## 2021-01-25 DIAGNOSIS — G4700 Insomnia, unspecified: Secondary | ICD-10-CM | POA: Diagnosis not present

## 2021-01-25 DIAGNOSIS — E785 Hyperlipidemia, unspecified: Secondary | ICD-10-CM | POA: Diagnosis not present

## 2021-01-25 DIAGNOSIS — I10 Essential (primary) hypertension: Secondary | ICD-10-CM | POA: Diagnosis not present

## 2021-01-25 DIAGNOSIS — R232 Flushing: Secondary | ICD-10-CM | POA: Diagnosis not present

## 2021-01-25 DIAGNOSIS — E039 Hypothyroidism, unspecified: Secondary | ICD-10-CM | POA: Diagnosis not present

## 2021-01-25 DIAGNOSIS — I25119 Atherosclerotic heart disease of native coronary artery with unspecified angina pectoris: Secondary | ICD-10-CM | POA: Diagnosis not present

## 2021-01-25 DIAGNOSIS — J449 Chronic obstructive pulmonary disease, unspecified: Secondary | ICD-10-CM | POA: Diagnosis not present

## 2021-02-01 NOTE — Progress Notes (Signed)
CARDIOLOGY CONSULT NOTE       Patient ID: Tabitha Schmidt MRN: [id number] DOB/AGE: [birth year] 65 y.o.  Admit date: (Not on file) Referring Physician: [name] Primary Physician: [name], MD Primary Cardiologist: [name] Reason for Consultation: Palpitations  Active Problems:   * No active hospital problems. *   HPI:  65 y.o. referred by Dr [name] for palpitations. She has been followed in past by [facility]. She has a history of right carotid stenting Known CAD with cath 05/18/19 for NSTEMI showing occluded mid/distal RCA stented with good results and no disease in left system EF 50-55% Last carotid duplex 09/02/19 with patent right stent and 40-59% Left ICA stenosis  She is still smoking  I see her son She sees endocrine for hypothyroidism on replacement Her TSH has been suppressed repeatedly over the last year most recently 0.20 on 11/04/20 Replacement changed to Tirosint 09/30/20   She has an 50 yo grand daughter "adopted" living with her with severe mental health issues  Depression bipolar suicidal at times   Counseled on smoking cessation < 10 minutes Discussed lung cancer screening CT Needs new nitro no real angina  She indicates having f/u for [place] and visit with [facility]  ROS All other systems reviewed and negative except as noted above  Past Medical History:  Diagnosis Date  . Anxiety   . Arthritis   . Asthma   . Cataracts, bilateral    [name]  . Complication of anesthesia    Note: pt has hx of TMJ " every now and then my jaw will lock "  . COPD (chronic obstructive pulmonary disease) (HCC)   . Coronary artery disease    a. s/p DES to mid-RCA in 05/2019  . DM (diabetes mellitus) (HCC)    "came off medicines with dr's permission"  . Family history of adverse reaction to anesthesia    " my son woke up crying "  . Fatty liver   . Fibromyalgia   . GERD (gastroesophageal reflux disease)   . Hyperlipidemia    stopped meds on her own  . Hypothyroid   . Myocardial infarction (HCC)    . On supplemental oxygen therapy    at night and PRN  . Pneumonia   . Shortness of breath   . Stenosis of right carotid artery   . TMJ (dislocation of temporomandibular joint)     Family History  Problem Relation Age of Onset  . CAD Father   . Anesthesia problems Neg Hx   . Hypotension Neg Hx   . Malignant hyperthermia Neg Hx   . Pseudochol deficiency Neg Hx     Social History   Socioeconomic History  . Marital status: Single    Spouse name: Not on file  . Number of children: Not on file  . Years of education: Not on file  . Highest education level: Not on file  Occupational History  . Not on file  Tobacco Use  . Smoking status: Current Every Day Smoker    Packs/day: 0.50    Years: 40.00    Pack years: 20.00    Types: Cigarettes  . Smokeless tobacco: Never Used  Vaping Use  . Vaping Use: Never used  Substance and Sexual Activity  . Alcohol use: No  . Drug use: No    Types: Cocaine  . Sexual activity: Yes    Birth control/protection: Surgical  Other Topics Concern  . Not on file  Social History Narrative  . Not on file  Social Determinants of Health   Financial Resource Strain: Not on file  Food Insecurity: Not on file  Transportation Needs: Not on file  Physical Activity: Not on file  Stress: Not on file  Social Connections: Not on file  Intimate Partner Violence: Not on file    Past Surgical History:  Procedure Laterality Date  . ABDOMINAL HYSTERECTOMY    . BREAST SURGERY     removal of bilateral benign tumors  . CARDIAC CATHETERIZATION     2010  . CHOLECYSTECTOMY    . CORONARY STENT INTERVENTION N/A 05/18/2019   Procedure: CORONARY STENT INTERVENTION;  Surgeon: Runell Gess, MD;  [facility]: [facility];  Service: Cardiovascular;  Laterality: N/A;  . DILATION AND CURETTAGE OF UTERUS    . ESOPHAGOGASTRODUODENOSCOPY  01/26/2012   Procedure: ESOPHAGOGASTRODUODENOSCOPY (EGD);  Surgeon: Malissa Hippo, MD;  [facility];  Service:  Endoscopy;  Laterality: N/A;  730  . LEFT HEART CATH AND CORONARY ANGIOGRAPHY N/A 05/18/2019   Procedure: LEFT HEART CATH AND CORONARY ANGIOGRAPHY;  Surgeon: Runell Gess, MD;  [facility];  Service: Cardiovascular;  Laterality: N/A;  . TRANSCAROTID ARTERY REVASCULARIZATION Right 08/02/2019   Procedure: TRANSCAROTID ARTERY REVASCULARIZATION RIGHT;  Surgeon: Nada Libman, MD;  Location: [facility] OR;  Service: Vascular;  Laterality: Right;  . TUBAL LIGATION        Current Outpatient Medications:  .  aspirin EC 81 MG EC tablet, Take 1 tablet (81 mg total) by mouth daily., Disp: 30 tablet, Rfl: 3 .  atorvastatin (LIPITOR) 80 MG tablet, TAKE ONE TABLET BY MOUTH AT 6PM., Disp: 90 tablet, Rfl: 0 .  BREZTRI AEROSPHERE 160-9-4.8 MCG/ACT AERO, INHALE (2) PUFFS TWICEODAILY FOR COPD RINSE MOUTH AFTER INHALATION., Disp: , Rfl:  .  FLUoxetine (PROZAC) 20 MG capsule, Take 20 mg by mouth daily., Disp: , Rfl:  .  LORazepam (ATIVAN) 0.5 MG tablet, Take 0.5 mg by mouth 4 (four) times daily as needed., Disp: , Rfl:  .  meclizine (ANTIVERT) 25 MG tablet, Take 25 mg by mouth every 6 (six) hours., Disp: , Rfl:  .  metoprolol tartrate (LOPRESSOR) 25 MG tablet, Take 0.5 tablets (12.5 mg total) by mouth 2 (two) times daily., Disp: 30 tablet, Rfl: 10 .  NALTREXONE HCL PO, Take 4.5 mg by mouth every morning., Disp: , Rfl:  .  PROAIR HFA 108 (90 Base) MCG/ACT inhaler, Inhale 1 puff into the lungs every 6 (six) hours as needed for wheezing or shortness of breath. , Disp: , Rfl:  .  RESTASIS 0.05 % ophthalmic emulsion, 1 drop 2 (two) times daily., Disp: , Rfl:  .  Suvorexant 5 MG TABS, Take 5 mg by mouth daily., Disp: , Rfl:  .  thyroid (ARMOUR) 90 MG tablet, Take 90 mg by mouth daily., Disp: , Rfl:     Physical Exam: Blood pressure 128/66, pulse 64, height 5\' 7"  (1.702 m), weight 87.1 kg, SpO2 95 %.    Affect appropriate Healthy:  appears stated age HEENT: normal Neck supple with no  adenopathy JVP normal no bruits no thyromegaly Lungs clear with no wheezing and good diaphragmatic motion Heart:  S1/S2 no murmur, no rub, gallop or click PMI normal Abdomen: benighn, BS positve, no tenderness, no AAA no bruit.  No HSM or HJR Distal pulses intact with no bruits No edema Neuro non-focal Skin warm and dry No muscular weakness   Labs:   Lab Results  Component Value Date   WBC 8.9  08/03/2019   HGB 11.8 (L) 08/03/2019   HCT 34.0 (L) 08/03/2019   MCV 91.9 08/03/2019   PLT 208 08/03/2019   No results for input(s): NA, K, CL, CO2, BUN, CREATININE, CALCIUM, PROT, BILITOT, ALKPHOS, ALT, AST, GLUCOSE in the last 168 hours.  Invalid input(s): LABALBU No results found for: CKTOTAL, CKMB, CKMBINDEX, TROPONINI  Lab Results  Component Value Date   CHOL 181 05/14/2019   Lab Results  Component Value Date   HDL 35 (L) 05/14/2019   Lab Results  Component Value Date   LDLCALC 113 (H) 05/14/2019   Lab Results  Component Value Date   TRIG 167 (H) 05/14/2019   Lab Results  Component Value Date   CHOLHDL 5.2 05/14/2019   No results found for: LDLDIRECT    Radiology: No results found.  EKG: 05/20/19 SR lateral T wave changes 02/10/2021 SB rate 64 normal    ASSESSMENT AND PLAN:   1. CAD:  Stenting of occluded mid/distal RCA July 2020 Continue ASA/statin/beta blocker No residual left sided disease no need for stress testing now  2. HLD on statin labs with primary  3. Smoking:  Counseled on cessation has had Chantix before Lung cancer screening CT 4. PVD: post stenting right ICA with residual 40-59% left ICA stenosis by duplex 09/02/19 Needs updated [place] and f/u Dr Myra Gianotti  [facility] to arrange  5. Thyroid:  TSH still suppressed over last year most recently 0.2 11/04/20 f/u primary adjust meds 6. Palpitations: benign does not complain to me suspect it has more to do with over replacement of thyroid with suppressed TSH  Lung cancer CT New nitro called in   F/U in a year     Signed: Charlton Haws 02/10/2021, 9:51 AM

## 2021-02-10 ENCOUNTER — Encounter: Payer: Self-pay | Admitting: Cardiovascular Disease

## 2021-02-10 ENCOUNTER — Ambulatory Visit (INDEPENDENT_AMBULATORY_CARE_PROVIDER_SITE_OTHER): Payer: Medicare Other | Admitting: Cardiovascular Disease

## 2021-02-10 ENCOUNTER — Other Ambulatory Visit: Payer: Self-pay

## 2021-02-10 VITALS — BP 128/66 | HR 64 | Ht 67.0 in | Wt 192.0 lb

## 2021-02-10 DIAGNOSIS — E782 Mixed hyperlipidemia: Secondary | ICD-10-CM

## 2021-02-10 DIAGNOSIS — R002 Palpitations: Secondary | ICD-10-CM

## 2021-02-10 DIAGNOSIS — F17200 Nicotine dependence, unspecified, uncomplicated: Secondary | ICD-10-CM

## 2021-02-10 DIAGNOSIS — I2510 Atherosclerotic heart disease of native coronary artery without angina pectoris: Secondary | ICD-10-CM | POA: Diagnosis not present

## 2021-02-10 DIAGNOSIS — Z95828 Presence of other vascular implants and grafts: Secondary | ICD-10-CM | POA: Diagnosis not present

## 2021-02-10 MED ORDER — NITROGLYCERIN 0.4 MG SL SUBL: 0.4000 mg | SUBLINGUAL_TABLET | SUBLINGUAL | 3 refills | Status: DC | PRN

## 2021-02-10 NOTE — Addendum Note (Signed)
Addended by: Kerney Elbe on: 02/10/2021 10:16 AM   Modules accepted: Orders

## 2021-02-10 NOTE — Patient Instructions (Signed)
Medication Instructions:  Your physician recommends that you continue on your current medications as directed. Please refer to the Current Medication list given to you today.  *If you need a refill on your cardiac medications before your next appointment, please call your pharmacy*   Lab Work: NONE   If you have labs (blood work) drawn today and your tests are completely normal, you will receive your results only by: . MyChart Message (if you have MyChart) OR . A paper copy in the mail If you have any lab test that is abnormal or we need to change your treatment, we will call you to review the results.   Testing/Procedures: Non-Cardiac CT scanning, (CAT scanning), is a noninvasive, special x-ray that produces cross-sectional images of the body using x-rays and a computer. CT scans help physicians diagnose and treat medical conditions. For some CT exams, a contrast material is used to enhance visibility in the area of the body being studied. CT scans provide greater clarity and reveal more details than regular x-ray exams.     Follow-Up: At [facility], you and your health needs are our priority.  As part of our continuing mission to provide you with exceptional heart care, we have created designated Provider Care Teams.  These Care Teams include your primary Cardiologist (physician) and Advanced Practice Providers (APPs -  Physician Assistants and Nurse Practitioners) who all work together to provide you with the care you need, when you need it.  We recommend signing up for the patient portal called "MyChart".  Sign up information is provided on this After Visit Summary.  MyChart is used to connect with patients for Virtual Visits (Telemedicine).  Patients are able to view lab/test results, encounter notes, upcoming appointments, etc.  Non-urgent messages can be sent to your provider as well.   To learn more about what you can do with MyChart, go to [email or web address].    Your next  appointment:   1 year(s)  The format for your next appointment:   In Person  Provider:   Peter Nishan, MD   Other Instructions Thank you for choosing [facility]!    

## 2021-02-20 DIAGNOSIS — J449 Chronic obstructive pulmonary disease, unspecified: Secondary | ICD-10-CM | POA: Diagnosis not present

## 2021-02-22 DIAGNOSIS — G4700 Insomnia, unspecified: Secondary | ICD-10-CM | POA: Diagnosis not present

## 2021-03-11 ENCOUNTER — Other Ambulatory Visit: Payer: Self-pay

## 2021-03-11 ENCOUNTER — Ambulatory Visit (HOSPITAL_COMMUNITY)
Admission: RE | Admit: 2021-03-11 | Discharge: 2021-03-11 | Disposition: A | Discharge status: Home / Self Care | Payer: Medicare Other | Source: Ambulatory Visit | Attending: Cardiovascular Disease | Admitting: Cardiovascular Disease

## 2021-03-11 DIAGNOSIS — I700 Atherosclerosis of aorta: Secondary | ICD-10-CM | POA: Diagnosis not present

## 2021-03-11 DIAGNOSIS — I2510 Atherosclerotic heart disease of native coronary artery without angina pectoris: Secondary | ICD-10-CM | POA: Insufficient documentation

## 2021-03-11 DIAGNOSIS — F17210 Nicotine dependence, cigarettes, uncomplicated: Secondary | ICD-10-CM | POA: Insufficient documentation

## 2021-03-11 DIAGNOSIS — Z122 Encounter for screening for malignant neoplasm of respiratory organs: Secondary | ICD-10-CM | POA: Insufficient documentation

## 2021-03-11 DIAGNOSIS — F17200 Nicotine dependence, unspecified, uncomplicated: Secondary | ICD-10-CM

## 2021-03-11 DIAGNOSIS — J439 Emphysema, unspecified: Secondary | ICD-10-CM | POA: Diagnosis not present

## 2021-03-14 DIAGNOSIS — M545 Low back pain, unspecified: Secondary | ICD-10-CM | POA: Insufficient documentation

## 2021-03-14 DIAGNOSIS — I10 Essential (primary) hypertension: Secondary | ICD-10-CM | POA: Insufficient documentation

## 2021-03-14 DIAGNOSIS — F419 Anxiety disorder, unspecified: Secondary | ICD-10-CM | POA: Insufficient documentation

## 2021-03-14 DIAGNOSIS — F5104 Psychophysiologic insomnia: Secondary | ICD-10-CM | POA: Insufficient documentation

## 2021-03-14 DIAGNOSIS — M130 Polyarthritis, unspecified: Secondary | ICD-10-CM | POA: Insufficient documentation

## 2021-03-14 DIAGNOSIS — R002 Palpitations: Secondary | ICD-10-CM | POA: Insufficient documentation

## 2021-03-14 DIAGNOSIS — N951 Menopausal and female climacteric states: Secondary | ICD-10-CM | POA: Insufficient documentation

## 2021-03-15 ENCOUNTER — Telehealth: Payer: Self-pay | Admitting: Cardiovascular Disease

## 2021-03-15 DIAGNOSIS — G4700 Insomnia, unspecified: Secondary | ICD-10-CM | POA: Diagnosis not present

## 2021-03-15 NOTE — Telephone Encounter (Signed)
New Message:    Diane from [facility] is calling with a call report.

## 2021-03-15 NOTE — Telephone Encounter (Signed)
Pulmonary already commented on results, consult made for 03/31/21

## 2021-03-15 NOTE — Telephone Encounter (Signed)
Chest CT report received-call received from radiology.  IMPRESSION: Lung-RADS 4A, suspicious. Irregular indistinct subpleural nodular opacity in the posterior apical right upper lobe measuring 10.0 mm in volume derived mean diameter, not definitely visualized on 2017 chest CT, indeterminate for neoplasm, although benign nodular scarring is favored. Follow up low-dose chest CT without contrast in 3 months (please use the following order, "CT CHEST LCS NODULE FOLLOW-UP W/O CM") is recommended. Alternatively, PET may be considered when there is a solid component 8 mm or larger.   Will forward to ordering provider

## 2021-03-22 DIAGNOSIS — J449 Chronic obstructive pulmonary disease, unspecified: Secondary | ICD-10-CM | POA: Diagnosis not present

## 2021-03-31 ENCOUNTER — Institutional Professional Consult (permissible substitution): Payer: Medicare Other | Admitting: Pulmonary Disease

## 2021-03-31 NOTE — Progress Notes (Deleted)
Synopsis: Referred in June 2022 for lung nodule by [name], MD  Subjective:   PATIENT ID: Tabitha Schmidt GENDER: female DOB: 02-18-1956, MRN: [id number]  No chief complaint on file.   This is a 65 year old female, past medical history of COPD, coronary artery disease status post DES to the mid RCA in July 2020, type 2 diabetes, gastroesophageal reflux, hypothyroidism, carotid artery disease.  Patient follows with the cardiology, Dr. Eden Emms.  Patient had a lung cancer screening CT completed on 03/11/2021.  Lung cancer screening CT revealed a suspicious irregular subpleural nodule within the posterior apical right upper lobe measuring 10 mm concerning for possible underlying bronchogenic carcinoma.  Patient is a current every day smoker at approximately half a pack a day, 20-pack-year history of smoking.  CT also revealed evidence of centrilobular emphysema.   ***  Past Medical History:  Diagnosis Date  . Anxiety   . Arthritis   . Asthma   . Cataracts, bilateral    [name]  . Complication of anesthesia    Note: pt has hx of TMJ " every now and then my jaw will lock "  . COPD (chronic obstructive pulmonary disease) (HCC)   . Coronary artery disease    a. s/p DES to mid-RCA in 05/2019  . DM (diabetes mellitus) (HCC)    "came off medicines with dr's permission"  . Family history of adverse reaction to anesthesia    " my son woke up crying "  . Fatty liver   . Fibromyalgia   . GERD (gastroesophageal reflux disease)   . Hyperlipidemia    stopped meds on her own  . Hypothyroid   . Myocardial infarction (HCC)   . On supplemental oxygen therapy    at night and PRN  . Pneumonia   . Shortness of breath   . Stenosis of right carotid artery   . TMJ (dislocation of temporomandibular joint)      Family History  Problem Relation Age of Onset  . CAD Father   . Anesthesia problems Neg Hx   . Hypotension Neg Hx   . Malignant hyperthermia Neg Hx   . Pseudochol deficiency Neg Hx       Past Surgical History:  Procedure Laterality Date  . ABDOMINAL HYSTERECTOMY    . BREAST SURGERY     removal of bilateral benign tumors  . CARDIAC CATHETERIZATION     2010  . CHOLECYSTECTOMY    . CORONARY STENT INTERVENTION N/A 05/18/2019   Procedure: CORONARY STENT INTERVENTION;  Surgeon: Runell Gess, MD;  [facility]: [facility];  Service: Cardiovascular;  Laterality: N/A;  . DILATION AND CURETTAGE OF UTERUS    . ESOPHAGOGASTRODUODENOSCOPY  01/26/2012   Procedure: ESOPHAGOGASTRODUODENOSCOPY (EGD);  Surgeon: Malissa Hippo, MD;  [facility]: [facility];  Service: Endoscopy;  Laterality: N/A;  730  . LEFT HEART CATH AND CORONARY ANGIOGRAPHY N/A 05/18/2019   Procedure: LEFT HEART CATH AND CORONARY ANGIOGRAPHY;  Surgeon: Runell Gess, MD;  Location: [facility];  Service: Cardiovascular;  Laterality: N/A;  . TRANSCAROTID ARTERY REVASCULARIZATION Right 08/02/2019   Procedure: TRANSCAROTID ARTERY REVASCULARIZATION RIGHT;  Surgeon: Nada Libman, MD;  Location: [facility];  Service: Vascular;  Laterality: Right;  . TUBAL LIGATION      Social History   Socioeconomic History  . Marital status: Single    Spouse name: Not on file  . Number of children: Not on file  . Years of education: Not on file  .  Highest education level: Not on file  Occupational History  . Not on file  Tobacco Use  . Smoking status: Current Every Day Smoker    Packs/day: 0.50    Years: 40.00    Pack years: 20.00    Types: Cigarettes  . Smokeless tobacco: Never Used  Vaping Use  . Vaping Use: Never used  Substance and Sexual Activity  . Alcohol use: No  . Drug use: No    Types: Cocaine  . Sexual activity: Yes    Birth control/protection: Surgical  Other Topics Concern  . Not on file  Social History Narrative  . Not on file   Social Determinants of Health   Financial Resource Strain: Not on file  Food Insecurity: Not on file  Transportation Needs: Not on file  Physical  Activity: Not on file  Stress: Not on file  Social Connections: Not on file  Intimate Partner Violence: Not on file     Allergies  Allergen Reactions  . Zegerid [Omeprazole-Sodium Bicarbonate]     Irregular Heart Beat     Outpatient Medications Prior to Visit  Medication Sig Dispense Refill  . aspirin EC 81 MG EC tablet Take 1 tablet (81 mg total) by mouth daily. 30 tablet 3  . atorvastatin (LIPITOR) 80 MG tablet TAKE ONE TABLET BY MOUTH AT 6PM. 90 tablet 0  . BREZTRI AEROSPHERE 160-9-4.8 MCG/ACT AERO INHALE (2) PUFFS TWICEODAILY FOR COPD RINSE MOUTH AFTER INHALATION.    Marland Kitchen FLUoxetine (PROZAC) 20 MG capsule Take 20 mg by mouth daily.    Marland Kitchen LORazepam (ATIVAN) 0.5 MG tablet Take 0.5 mg by mouth 4 (four) times daily as needed.    . meclizine (ANTIVERT) 25 MG tablet Take 25 mg by mouth every 6 (six) hours.    . metoprolol tartrate (LOPRESSOR) 25 MG tablet Take 0.5 tablets (12.5 mg total) by mouth 2 (two) times daily. 30 tablet 10  . NALTREXONE HCL PO Take 4.5 mg by mouth every morning.    . nitroGLYCERIN (NITROSTAT) 0.4 MG SL tablet Place 1 tablet (0.4 mg total) under the tongue every 5 (five) minutes as needed for chest pain. 25 tablet 3  . PROAIR HFA 108 (90 Base) MCG/ACT inhaler Inhale 1 puff into the lungs every 6 (six) hours as needed for wheezing or shortness of breath.     . RESTASIS 0.05 % ophthalmic emulsion 1 drop 2 (two) times daily.    . Suvorexant 5 MG TABS Take 5 mg by mouth daily.    Marland Kitchen thyroid (ARMOUR) 90 MG tablet Take 90 mg by mouth daily.     No facility-administered medications prior to visit.    ROS   Objective:  Physical Exam   There were no vitals filed for this visit.   on *** LPM *** RA BMI Readings from Last 3 Encounters:  02/10/21 30.07 kg/m  09/30/20 29.76 kg/m  08/13/20 28.66 kg/m   Wt Readings from Last 3 Encounters:  02/10/21 192 lb (87.1 kg)  09/30/20 190 lb (86.2 kg)  08/13/20 183 lb (83 kg)     CBC    Component Value Date/Time   WBC  8.9 08/03/2019 0353   RBC 3.70 (L) 08/03/2019 0353   HGB 11.8 (L) 08/03/2019 0353   HCT 34.0 (L) 08/03/2019 0353   PLT 208 08/03/2019 0353   MCV 91.9 08/03/2019 0353   MCH 31.9 08/03/2019 0353   MCHC 34.7 08/03/2019 0353   RDW 12.4 08/03/2019 0353   LYMPHSABS 2.0 05/17/2019 2337  MONOABS 0.5 05/17/2019 2337   EOSABS 0.1 05/17/2019 2337   BASOSABS 0.0 05/17/2019 2337    ***  Chest Imaging: ***  Pulmonary Functions Testing Results: No flowsheet data found.  FeNO: ***  Pathology: ***  Echocardiogram: ***  Heart Catheterization: ***    Assessment & Plan:     ICD-10-CM   1. Nodule of upper lobe of right lung  R91.1   2. Centrilobular emphysema (HCC)  J43.2   3. Tobacco abuse  Z72.0   4. Encounter for smoking cessation counseling  Z71.6     Discussion: ***   Current Outpatient Medications:  .  aspirin EC 81 MG EC tablet, Take 1 tablet (81 mg total) by mouth daily., Disp: 30 tablet, Rfl: 3 .  atorvastatin (LIPITOR) 80 MG tablet, TAKE ONE TABLET BY MOUTH AT 6PM., Disp: 90 tablet, Rfl: 0 .  BREZTRI AEROSPHERE 160-9-4.8 MCG/ACT AERO, INHALE (2) PUFFS TWICEODAILY FOR COPD RINSE MOUTH AFTER INHALATION., Disp: , Rfl:  .  FLUoxetine (PROZAC) 20 MG capsule, Take 20 mg by mouth daily., Disp: , Rfl:  .  LORazepam (ATIVAN) 0.5 MG tablet, Take 0.5 mg by mouth 4 (four) times daily as needed., Disp: , Rfl:  .  meclizine (ANTIVERT) 25 MG tablet, Take 25 mg by mouth every 6 (six) hours., Disp: , Rfl:  .  metoprolol tartrate (LOPRESSOR) 25 MG tablet, Take 0.5 tablets (12.5 mg total) by mouth 2 (two) times daily., Disp: 30 tablet, Rfl: 10 .  NALTREXONE HCL PO, Take 4.5 mg by mouth every morning., Disp: , Rfl:  .  nitroGLYCERIN (NITROSTAT) 0.4 MG SL tablet, Place 1 tablet (0.4 mg total) under the tongue every 5 (five) minutes as needed for chest pain., Disp: 25 tablet, Rfl: 3 .  PROAIR HFA 108 (90 Base) MCG/ACT inhaler, Inhale 1 puff into the lungs every 6 (six) hours as needed for  wheezing or shortness of breath. , Disp: , Rfl:  .  RESTASIS 0.05 % ophthalmic emulsion, 1 drop 2 (two) times daily., Disp: , Rfl:  .  Suvorexant 5 MG TABS, Take 5 mg by mouth daily., Disp: , Rfl:  .  thyroid (ARMOUR) 90 MG tablet, Take 90 mg by mouth daily., Disp: , Rfl:   I spent *** minutes dedicated to the care of this patient on the date of this encounter to include pre-visit review of records, face-to-face time with the patient discussing conditions above, post visit ordering of testing, clinical documentation with the electronic health record, making appropriate referrals as documented, and communicating necessary findings to members of the patients care team.   [name], DO [facility] 03/31/2021 7:54 AM

## 2021-04-14 ENCOUNTER — Ambulatory Visit (INDEPENDENT_AMBULATORY_CARE_PROVIDER_SITE_OTHER): Payer: Medicare Other | Admitting: Pulmonary Disease

## 2021-04-14 ENCOUNTER — Other Ambulatory Visit: Payer: Self-pay

## 2021-04-14 ENCOUNTER — Encounter: Payer: Self-pay | Admitting: Pulmonary Disease

## 2021-04-14 VITALS — BP 118/60 | HR 66 | Ht 67.0 in | Wt 182.0 lb

## 2021-04-14 DIAGNOSIS — F17210 Nicotine dependence, cigarettes, uncomplicated: Secondary | ICD-10-CM

## 2021-04-14 DIAGNOSIS — F17200 Nicotine dependence, unspecified, uncomplicated: Secondary | ICD-10-CM

## 2021-04-14 DIAGNOSIS — R918 Other nonspecific abnormal finding of lung field: Secondary | ICD-10-CM

## 2021-04-14 DIAGNOSIS — R911 Solitary pulmonary nodule: Secondary | ICD-10-CM | POA: Diagnosis not present

## 2021-04-14 DIAGNOSIS — J432 Centrilobular emphysema: Secondary | ICD-10-CM

## 2021-04-14 NOTE — Progress Notes (Signed)
Smoking Cessation Counseling:   The patient's current tobacco use: 0.5 ppd The patient was advised to quit and impact of smoking on their health.  I assessed the patient's willingness to attempt to quit. I provided methods and skills for cessation. We reviewed medication management of smoking session drugs if appropriate. Resources to help quit smoking were provided. A smoking cessation quit date was set: She cant pick a date (not ready to quit)  Follow-up was arranged in our clinic.  The amount of time spent counseling patient was 4 mins    [name], DO [facility] 04/14/2021 10:52 AM

## 2021-04-14 NOTE — Progress Notes (Signed)
Synopsis: Referred in June 2022 for lung nodule by [name], MD  Subjective:   PATIENT ID: Tabitha Schmidt GENDER: female DOB: 03/21/1956, MRN: [id number]  Chief Complaint  Patient presents with   Consult    Abnormal imaging, nodule    This is a 65 year old female, past medical history of COPD, coronary artery disease, DES to mid RCA in July 2020, diabetes, reflux.Patient had lung cancer screening CT completed on 03/13/2021 this was a suspicious lung RADS 4A concerning for a irregular subpleural nodular opacity that had not been previously visualized in 2017.  There was a 10 mm nodular focus.  Patient has COPD, evidence of centrilobular emphysema on CT imaging.  Currently managed with triple therapy inhaler regimen.  Longstanding history of smoking.  She quit in the past but has restarted.  This tends to be her crutch related to stress and anxiety management.  Dealing with her 64 year old daughter that has bipolar disease and recently moved out.  She is concerned for her drug use and illicit behavior.   Past Medical History:  Diagnosis Date   Anxiety    Arthritis    Asthma    Cataracts, bilateral    [name]   Complication of anesthesia    Note: pt has hx of TMJ " every now and then my jaw will lock "   COPD (chronic obstructive pulmonary disease) (HCC)    Coronary artery disease    a. s/p DES to mid-RCA in 05/2019   DM (diabetes mellitus) (HCC)    "came off medicines with dr's permission"   Family history of adverse reaction to anesthesia    " my son woke up crying "   Fatty liver    Fibromyalgia    GERD (gastroesophageal reflux disease)    Hyperlipidemia    stopped meds on her own   Hypothyroid    Myocardial infarction (HCC)    On supplemental oxygen therapy    at night and PRN   Pneumonia    Shortness of breath    Stenosis of right carotid artery    TMJ (dislocation of temporomandibular joint)      Family History  Problem Relation Age of Onset   CAD Father     Anesthesia problems Neg Hx    Hypotension Neg Hx    Malignant hyperthermia Neg Hx    Pseudochol deficiency Neg Hx      Past Surgical History:  Procedure Laterality Date   ABDOMINAL HYSTERECTOMY     BREAST SURGERY     removal of bilateral benign tumors   CARDIAC CATHETERIZATION     2010   CHOLECYSTECTOMY     CORONARY STENT INTERVENTION N/A 05/18/2019   Procedure: CORONARY STENT INTERVENTION;  Surgeon: Runell Gess, MD;  Location: [facility];  Service: Cardiovascular;  Laterality: N/A;   DILATION AND CURETTAGE OF UTERUS     ESOPHAGOGASTRODUODENOSCOPY  01/26/2012   Procedure: ESOPHAGOGASTRODUODENOSCOPY (EGD);  Surgeon: Malissa Hippo, MD;  [facility]: [facility];  Service: Endoscopy;  Laterality: N/A;  730   LEFT HEART CATH AND CORONARY ANGIOGRAPHY N/A 05/18/2019   Procedure: LEFT HEART CATH AND CORONARY ANGIOGRAPHY;  Surgeon: Runell Gess, MD;  Location: [facility];  Service: Cardiovascular;  Laterality: N/A;   TRANSCAROTID ARTERY REVASCULARIZATION  Right 08/02/2019   Procedure: TRANSCAROTID ARTERY REVASCULARIZATION RIGHT;  Surgeon: Nada Libman, MD;  Location: [facility];  Service: Vascular;  Laterality: Right;   TUBAL LIGATION  Social History   Socioeconomic History   Marital status: Single    Spouse name: Not on file   Number of children: Not on file   Years of education: Not on file   Highest education level: Not on file  Occupational History   Not on file  Tobacco Use   Smoking status: Every Day    Packs/day: 0.50    Years: 40.00    Pack years: 20.00    Types: Cigarettes   Smokeless tobacco: Never  Vaping Use   Vaping Use: Never used  Substance and Sexual Activity   Alcohol use: No   Drug use: No    Types: Cocaine   Sexual activity: Yes    Birth control/protection: Surgical  Other Topics Concern   Not on file  Social History Narrative   Not on file   Social Determinants of Health   Financial Resource Strain: Not on file  Food  Insecurity: Not on file  Transportation Needs: Not on file  Physical Activity: Not on file  Stress: Not on file  Social Connections: Not on file  Intimate Partner Violence: Not on file     Allergies  Allergen Reactions   Zegerid [Omeprazole-Sodium Bicarbonate]     Irregular Heart Beat     Outpatient Medications Prior to Visit  Medication Sig Dispense Refill   aspirin EC 81 MG EC tablet Take 1 tablet (81 mg total) by mouth daily. 30 tablet 3   atorvastatin (LIPITOR) 80 MG tablet TAKE ONE TABLET BY MOUTH AT 6PM. 90 tablet 0   BREZTRI AEROSPHERE 160-9-4.8 MCG/ACT AERO INHALE (2) PUFFS TWICEODAILY FOR COPD RINSE MOUTH AFTER INHALATION.     FLUoxetine (PROZAC) 20 MG capsule Take 20 mg by mouth daily.     LORazepam (ATIVAN) 0.5 MG tablet Take 0.5 mg by mouth 4 (four) times daily as needed.     meclizine (ANTIVERT) 25 MG tablet Take 25 mg by mouth every 6 (six) hours.     metoprolol tartrate (LOPRESSOR) 25 MG tablet Take 0.5 tablets (12.5 mg total) by mouth 2 (two) times daily. 30 tablet 10   NALTREXONE HCL PO Take 4.5 mg by mouth every morning.     nitroGLYCERIN (NITROSTAT) 0.4 MG SL tablet Place 1 tablet (0.4 mg total) under the tongue every 5 (five) minutes as needed for chest pain. 25 tablet 3   PROAIR HFA 108 (90 Base) MCG/ACT inhaler Inhale 1 puff into the lungs every 6 (six) hours as needed for wheezing or shortness of breath.      RESTASIS 0.05 % ophthalmic emulsion 1 drop 2 (two) times daily.     Suvorexant 5 MG TABS Take 5 mg by mouth daily.     thyroid (ARMOUR) 90 MG tablet Take 90 mg by mouth daily.     No facility-administered medications prior to visit.    Review of Systems  Constitutional:  Negative for chills, fever, malaise/fatigue and weight loss.  HENT:  Negative for hearing loss, sore throat and tinnitus.   Eyes:  Negative for blurred vision and double vision.  Respiratory:  Positive for cough and shortness of breath. Negative for hemoptysis, sputum production,  wheezing and stridor.   Cardiovascular:  Negative for chest pain, palpitations, orthopnea, leg swelling and PND.  Gastrointestinal:  Negative for abdominal pain, constipation, diarrhea, heartburn, nausea and vomiting.  Genitourinary:  Negative for dysuria, hematuria and urgency.  Musculoskeletal:  Negative for joint pain and myalgias.  Skin:  Negative for itching and rash.  Neurological:  Negative for dizziness, tingling, weakness and headaches.  Endo/Heme/Allergies:  Negative for environmental allergies. Does not bruise/bleed easily.  Psychiatric/Behavioral:  Negative for depression. The patient is not nervous/anxious and does not have insomnia.   All other systems reviewed and are negative.   Objective:  Physical Exam Vitals reviewed.  Constitutional:      General: She is not in acute distress.    Appearance: She is well-developed.  HENT:     Head: Normocephalic and atraumatic.  Eyes:     General: No scleral icterus.    Conjunctiva/sclera: Conjunctivae normal.     Pupils: Pupils are equal, round, and reactive to light.  Neck:     Vascular: No JVD.     Trachea: No tracheal deviation.  Cardiovascular:     Rate and Rhythm: Normal rate and regular rhythm.     Heart sounds: Normal heart sounds. No murmur heard. Pulmonary:     Effort: Pulmonary effort is normal. No tachypnea, accessory muscle usage or respiratory distress.     Breath sounds: No stridor. No wheezing, rhonchi or rales.     Comments: Diminished breath sounds bilaterally Abdominal:     General: Bowel sounds are normal. There is no distension.     Palpations: Abdomen is soft.     Tenderness: There is no abdominal tenderness.  Musculoskeletal:        General: No tenderness.     Cervical back: Neck supple.  Lymphadenopathy:     Cervical: No cervical adenopathy.  Skin:    General: Skin is warm and dry.     Capillary Refill: Capillary refill takes less than 2 seconds.     Findings: No rash.  Neurological:      Mental Status: She is alert and oriented to person, place, and time.  Psychiatric:        Behavior: Behavior normal.     Vitals:   04/14/21 1017  BP: 118/60  Pulse: 66  SpO2: 97%  Weight: 182 lb (82.6 kg)  Height: 5\' 7"  (1.702 m)   97% on RA BMI Readings from Last 3 Encounters:  04/14/21 28.51 kg/m  02/10/21 30.07 kg/m  09/30/20 29.76 kg/m   Wt Readings from Last 3 Encounters:  04/14/21 182 lb (82.6 kg)  02/10/21 192 lb (87.1 kg)  09/30/20 190 lb (86.2 kg)     CBC    Component Value Date/Time   WBC 8.9 08/03/2019 0353   RBC 3.70 (L) 08/03/2019 0353   HGB 11.8 (L) 08/03/2019 0353   HCT 34.0 (L) 08/03/2019 0353   PLT 208 08/03/2019 0353   MCV 91.9 08/03/2019 0353   MCH 31.9 08/03/2019 0353   MCHC 34.7 08/03/2019 0353   RDW 12.4 08/03/2019 0353   LYMPHSABS 2.0 05/17/2019 2337   MONOABS 0.5 05/17/2019 2337   EOSABS 0.1 05/17/2019 2337   BASOSABS 0.0 05/17/2019 2337    Chest Imaging: Lung cancer screening CT Mar 13, 2021: Right upper lobe subpleural nodular density subsolid in nature, 10 mm in largest focus area. The patient's images have been independently reviewed by me.    Pulmonary Functions Testing Results: No flowsheet data found.  FeNO:   Pathology:   Echocardiogram:   Heart Catheterization:     Assessment & Plan:     ICD-10-CM   1. Nodule of upper lobe of right lung  R91.1 CT Super D Chest Wo Contrast    2. Current smoker  F17.200     3. Centrilobular emphysema (HCC)  J43.2  4. Cigarette smoker  F17.210 CT Super D Chest Wo Contrast    5. Abnormal CT lung screening  R91.8 CT Super D Chest Wo Contrast      Discussion: This is a 65 year old female recent abnormal lung cancer screening CT.  This revealed a right upper lobe subsolid 10 mm nodule.  Recommending short-term follow-up.  She is also a current smoker.  Please see separate documentation regarding smoking cessation.  Plan: She needs to continue her current triple therapy  inhaler regimen for her emphysema/COPD.  Follow-up 45-month noncontrasted CT chest for evaluation of upper lobe lung nodule. If there is any change in the nodule we will make appropriate next steps to consider biopsy or close follow-up.  Patient is agreeable to this plan.  We reviewed his CT imaging today in the office.  Return to clinic in 3 months after CT scan is complete to be seen by me or APP.   Current Outpatient Medications:    aspirin EC 81 MG EC tablet, Take 1 tablet (81 mg total) by mouth daily., Disp: 30 tablet, Rfl: 3   atorvastatin (LIPITOR) 80 MG tablet, TAKE ONE TABLET BY MOUTH AT 6PM., Disp: 90 tablet, Rfl: 0   BREZTRI AEROSPHERE 160-9-4.8 MCG/ACT AERO, INHALE (2) PUFFS TWICEODAILY FOR COPD RINSE MOUTH AFTER INHALATION., Disp: , Rfl:    FLUoxetine (PROZAC) 20 MG capsule, Take 20 mg by mouth daily., Disp: , Rfl:    LORazepam (ATIVAN) 0.5 MG tablet, Take 0.5 mg by mouth 4 (four) times daily as needed., Disp: , Rfl:    meclizine (ANTIVERT) 25 MG tablet, Take 25 mg by mouth every 6 (six) hours., Disp: , Rfl:    metoprolol tartrate (LOPRESSOR) 25 MG tablet, Take 0.5 tablets (12.5 mg total) by mouth 2 (two) times daily., Disp: 30 tablet, Rfl: 10   NALTREXONE HCL PO, Take 4.5 mg by mouth every morning., Disp: , Rfl:    nitroGLYCERIN (NITROSTAT) 0.4 MG SL tablet, Place 1 tablet (0.4 mg total) under the tongue every 5 (five) minutes as needed for chest pain., Disp: 25 tablet, Rfl: 3   PROAIR HFA 108 (90 Base) MCG/ACT inhaler, Inhale 1 puff into the lungs every 6 (six) hours as needed for wheezing or shortness of breath. , Disp: , Rfl:    RESTASIS 0.05 % ophthalmic emulsion, 1 drop 2 (two) times daily., Disp: , Rfl:    Suvorexant 5 MG TABS, Take 5 mg by mouth daily., Disp: , Rfl:    thyroid (ARMOUR) 90 MG tablet, Take 90 mg by mouth daily., Disp: , Rfl:   This patient is critically ill with multiple organ system failure; which, requires frequent high complexity decision making,  assessment, support, evaluation, and titration of therapies. This was completed through the application of advanced monitoring technologies and extensive interpretation of multiple databases. During this encounter critical care time was devoted to patient care services described in this note for 32 minutes.   [name], DO [facility] 04/14/2021 11:01 AM

## 2021-04-14 NOTE — Patient Instructions (Signed)
Thank you for visiting Dr. [name] at [facility]. Today we recommend the following:  Orders Placed This Encounter  Procedures   CT Super D Chest Wo Contrast   CT scan in 3 months, can see me after.   Return in about 3 months (around 07/15/2021) for with APP or Dr. [name].    Please do your part to reduce the spread of COVID-19.

## 2021-04-20 DIAGNOSIS — G4700 Insomnia, unspecified: Secondary | ICD-10-CM | POA: Diagnosis not present

## 2021-04-20 DIAGNOSIS — I10 Essential (primary) hypertension: Secondary | ICD-10-CM | POA: Diagnosis not present

## 2021-04-20 DIAGNOSIS — M545 Low back pain, unspecified: Secondary | ICD-10-CM | POA: Diagnosis not present

## 2021-04-22 DIAGNOSIS — J449 Chronic obstructive pulmonary disease, unspecified: Secondary | ICD-10-CM | POA: Diagnosis not present

## 2021-04-28 DIAGNOSIS — I10 Essential (primary) hypertension: Secondary | ICD-10-CM | POA: Diagnosis not present

## 2021-05-04 DIAGNOSIS — R232 Flushing: Secondary | ICD-10-CM | POA: Insufficient documentation

## 2021-05-04 DIAGNOSIS — I2510 Atherosclerotic heart disease of native coronary artery without angina pectoris: Secondary | ICD-10-CM | POA: Insufficient documentation

## 2021-05-04 DIAGNOSIS — M1990 Unspecified osteoarthritis, unspecified site: Secondary | ICD-10-CM | POA: Insufficient documentation

## 2021-05-05 DIAGNOSIS — E785 Hyperlipidemia, unspecified: Secondary | ICD-10-CM | POA: Diagnosis not present

## 2021-05-05 DIAGNOSIS — J449 Chronic obstructive pulmonary disease, unspecified: Secondary | ICD-10-CM | POA: Diagnosis not present

## 2021-05-05 DIAGNOSIS — E782 Mixed hyperlipidemia: Secondary | ICD-10-CM | POA: Diagnosis not present

## 2021-05-05 DIAGNOSIS — E039 Hypothyroidism, unspecified: Secondary | ICD-10-CM | POA: Diagnosis not present

## 2021-05-05 DIAGNOSIS — I2510 Atherosclerotic heart disease of native coronary artery without angina pectoris: Secondary | ICD-10-CM | POA: Diagnosis not present

## 2021-05-05 DIAGNOSIS — M1990 Unspecified osteoarthritis, unspecified site: Secondary | ICD-10-CM | POA: Diagnosis not present

## 2021-05-05 DIAGNOSIS — I10 Essential (primary) hypertension: Secondary | ICD-10-CM | POA: Diagnosis not present

## 2021-05-05 DIAGNOSIS — R232 Flushing: Secondary | ICD-10-CM | POA: Diagnosis not present

## 2021-05-05 DIAGNOSIS — F418 Other specified anxiety disorders: Secondary | ICD-10-CM | POA: Insufficient documentation

## 2021-05-07 DIAGNOSIS — E039 Hypothyroidism, unspecified: Secondary | ICD-10-CM | POA: Diagnosis not present

## 2021-05-11 ENCOUNTER — Other Ambulatory Visit (HOSPITAL_BASED_OUTPATIENT_CLINIC_OR_DEPARTMENT_OTHER): Payer: Self-pay

## 2021-05-11 DIAGNOSIS — G4700 Insomnia, unspecified: Secondary | ICD-10-CM

## 2021-05-19 ENCOUNTER — Other Ambulatory Visit: Payer: Self-pay

## 2021-05-19 ENCOUNTER — Ambulatory Visit: Payer: Medicare Other | Attending: Neurology | Admitting: Neurology

## 2021-05-19 DIAGNOSIS — G478 Other sleep disorders: Secondary | ICD-10-CM | POA: Diagnosis not present

## 2021-05-19 DIAGNOSIS — G4700 Insomnia, unspecified: Secondary | ICD-10-CM | POA: Insufficient documentation

## 2021-05-19 DIAGNOSIS — G4733 Obstructive sleep apnea (adult) (pediatric): Secondary | ICD-10-CM | POA: Diagnosis not present

## 2021-05-22 DIAGNOSIS — J449 Chronic obstructive pulmonary disease, unspecified: Secondary | ICD-10-CM | POA: Diagnosis not present

## 2021-06-01 NOTE — Procedures (Signed)
[facility] Hayzel Ruberg A. Gerilyn Pilgrim, MD     [email or web address]             NOCTURNAL POLYSOMNOGRAPHY   LOCATION: [facility]   Patient Name: Tabitha Schmidt, Tabitha Schmidt Date: 05/19/2021 Gender: Female D.O.B: 1956-10-17 Age (years): 44 Referring Provider: Beryle Beams MD, ABSM Height (inches): 67 Interpreting Physician: Beryle Beams MD, ABSM Weight (lbs): 182 RPSGT: Peak, Robert BMI: 29 MRN: [id number] Neck Size: CLINICAL INFORMATION Sleep Study Type: NPSG     Indication for sleep study: Insomnia     Epworth Sleepiness Score: N/A     SLEEP STUDY TECHNIQUE As per the AASM Manual for the Scoring of Sleep and Associated Events v2.3 (April 2016) with a hypopnea requiring 4% desaturations.  The channels recorded and monitored were frontal, central and occipital EEG, electrooculogram (EOG), submentalis EMG (chin), nasal and oral airflow, thoracic and abdominal wall motion, anterior tibialis EMG, snore microphone, electrocardiogram, and pulse oximetry.  MEDICATIONS Medications self-administered by patient taken the night of the study : N/A  Current Outpatient Medications:    aspirin EC 81 MG EC tablet, Take 1 tablet (81 mg total) by mouth daily., Disp: 30 tablet, Rfl: 3   atorvastatin (LIPITOR) 80 MG tablet, TAKE ONE TABLET BY MOUTH AT 6PM., Disp: 90 tablet, Rfl: 0   BREZTRI AEROSPHERE 160-9-4.8 MCG/ACT AERO, INHALE (2) PUFFS TWICEODAILY FOR COPD RINSE MOUTH AFTER INHALATION., Disp: , Rfl:    FLUoxetine (PROZAC) 20 MG capsule, Take 20 mg by mouth daily., Disp: , Rfl:    LORazepam (ATIVAN) 0.5 MG tablet, Take 0.5 mg by mouth 4 (four) times daily as needed., Disp: , Rfl:    meclizine (ANTIVERT) 25 MG tablet, Take 25 mg by mouth every 6 (six) hours., Disp: , Rfl:    metoprolol tartrate (LOPRESSOR) 25 MG tablet, Take 0.5 tablets (12.5 mg total) by mouth 2 (two) times daily., Disp: 30 tablet, Rfl: 10   NALTREXONE HCL PO, Take 4.5 mg by mouth every morning., Disp: , Rfl:     nitroGLYCERIN (NITROSTAT) 0.4 MG SL tablet, Place 1 tablet (0.4 mg total) under the tongue every 5 (five) minutes as needed for chest pain., Disp: 25 tablet, Rfl: 3   PROAIR HFA 108 (90 Base) MCG/ACT inhaler, Inhale 1 puff into the lungs every 6 (six) hours as needed for wheezing or shortness of breath. , Disp: , Rfl:    RESTASIS 0.05 % ophthalmic emulsion, 1 drop 2 (two) times daily., Disp: , Rfl:    Suvorexant 5 MG TABS, Take 5 mg by mouth daily., Disp: , Rfl:    thyroid (ARMOUR) 90 MG tablet, Take 90 mg by mouth daily., Disp: , Rfl:      SLEEP ARCHITECTURE The study was initiated at 10:31:05 PM and ended at 5:19:49 AM.  Sleep onset time was 57.3 minutes and the sleep efficiency was 65.3%. The total sleep time was 267 minutes.  Stage REM latency was 317.0 minutes.  The patient spent 10.49% of the night in stage N1 sleep, 80.71% in stage N2 sleep, 0.00% in stage N3 and 8.8% in REM.  Alpha intrusion was absent.  Supine sleep was 0.00%.  RESPIRATORY PARAMETERS The overall apnea/hypopnea index (AHI) was 0.0 per hour. There were 0 total apneas, including 0 obstructive, 0 central and 0 mixed apneas. There were 0 hypopneas and 27 RERAs.  The AHI during Stage REM sleep was 0.0 per hour.  AHI while supine was N/A per hour.  The mean oxygen saturation was 88.73%. The minimum SpO2 during sleep was 86.00%.  soft snoring was noted during this study.  CARDIAC DATA The 2 lead EKG demonstrated sinus rhythm. The mean heart rate was 60.13 beats per minute. Other EKG findings include: PVCs.  LEG MOVEMENT DATA The total PLMS were 0 with a resulting PLMS index of 0.00. Associated arousal with leg movement index was 0.0.  IMPRESSIONS Abnormal sleep architecture with reduced sleep efficiency, increased fragmentation and absent slow-wave sleep.  No sleep disordered breathing/ sleep apnea is noted.    Argie Ramming, MD Diplomate, American Board of Sleep Medicine.  ELECTRONICALLY SIGNED ON:   06/01/2021, 3:17 PM [facility] PH: [phone]   FX: [phone] ACCREDITED BY THE AMERICAN ACADEMY OF SLEEP MEDICINE

## 2021-06-22 DIAGNOSIS — J449 Chronic obstructive pulmonary disease, unspecified: Secondary | ICD-10-CM | POA: Diagnosis not present

## 2021-06-24 DIAGNOSIS — G4700 Insomnia, unspecified: Secondary | ICD-10-CM | POA: Diagnosis not present

## 2021-06-24 DIAGNOSIS — M545 Low back pain, unspecified: Secondary | ICD-10-CM | POA: Diagnosis not present

## 2021-06-24 DIAGNOSIS — G4733 Obstructive sleep apnea (adult) (pediatric): Secondary | ICD-10-CM | POA: Diagnosis not present

## 2021-06-24 DIAGNOSIS — I10 Essential (primary) hypertension: Secondary | ICD-10-CM | POA: Diagnosis not present

## 2021-07-15 ENCOUNTER — Ambulatory Visit
Admission: RE | Admit: 2021-07-15 | Discharge: 2021-07-15 | Disposition: A | Discharge status: Home / Self Care | Payer: Medicare Other | Source: Ambulatory Visit | Attending: Pulmonary Disease | Admitting: Pulmonary Disease

## 2021-07-15 DIAGNOSIS — F17210 Nicotine dependence, cigarettes, uncomplicated: Secondary | ICD-10-CM

## 2021-07-15 DIAGNOSIS — R911 Solitary pulmonary nodule: Secondary | ICD-10-CM | POA: Diagnosis not present

## 2021-07-15 DIAGNOSIS — I700 Atherosclerosis of aorta: Secondary | ICD-10-CM | POA: Diagnosis not present

## 2021-07-15 DIAGNOSIS — J439 Emphysema, unspecified: Secondary | ICD-10-CM | POA: Diagnosis not present

## 2021-07-15 DIAGNOSIS — R918 Other nonspecific abnormal finding of lung field: Secondary | ICD-10-CM

## 2021-07-18 NOTE — Progress Notes (Signed)
Tabitha Schmidt, can you let her know that her repeat CT is stable. Please have her follow up in one year with the LDCT program September 2023.  Thanks,  BLI  [name], DO [facility] 07/18/2021 3:24 PM

## 2021-07-21 DIAGNOSIS — G4733 Obstructive sleep apnea (adult) (pediatric): Secondary | ICD-10-CM | POA: Diagnosis not present

## 2021-07-21 DIAGNOSIS — M545 Low back pain, unspecified: Secondary | ICD-10-CM | POA: Diagnosis not present

## 2021-07-21 DIAGNOSIS — I10 Essential (primary) hypertension: Secondary | ICD-10-CM | POA: Diagnosis not present

## 2021-07-23 DIAGNOSIS — J449 Chronic obstructive pulmonary disease, unspecified: Secondary | ICD-10-CM | POA: Diagnosis not present

## 2021-08-05 DIAGNOSIS — F17200 Nicotine dependence, unspecified, uncomplicated: Secondary | ICD-10-CM | POA: Diagnosis not present

## 2021-08-05 DIAGNOSIS — Z0001 Encounter for general adult medical examination with abnormal findings: Secondary | ICD-10-CM | POA: Diagnosis not present

## 2021-08-05 DIAGNOSIS — R5383 Other fatigue: Secondary | ICD-10-CM | POA: Diagnosis not present

## 2021-08-22 DIAGNOSIS — J449 Chronic obstructive pulmonary disease, unspecified: Secondary | ICD-10-CM | POA: Diagnosis not present

## 2021-08-30 DIAGNOSIS — E785 Hyperlipidemia, unspecified: Secondary | ICD-10-CM | POA: Diagnosis not present

## 2021-08-30 DIAGNOSIS — I10 Essential (primary) hypertension: Secondary | ICD-10-CM | POA: Diagnosis not present

## 2021-09-22 DIAGNOSIS — J449 Chronic obstructive pulmonary disease, unspecified: Secondary | ICD-10-CM | POA: Diagnosis not present

## 2021-10-22 DIAGNOSIS — J449 Chronic obstructive pulmonary disease, unspecified: Secondary | ICD-10-CM | POA: Diagnosis not present

## 2021-11-10 DIAGNOSIS — I10 Essential (primary) hypertension: Secondary | ICD-10-CM | POA: Diagnosis not present

## 2021-11-16 DIAGNOSIS — M1990 Unspecified osteoarthritis, unspecified site: Secondary | ICD-10-CM | POA: Diagnosis not present

## 2021-11-16 DIAGNOSIS — E039 Hypothyroidism, unspecified: Secondary | ICD-10-CM | POA: Diagnosis not present

## 2021-11-16 DIAGNOSIS — R232 Flushing: Secondary | ICD-10-CM | POA: Diagnosis not present

## 2021-11-16 DIAGNOSIS — E782 Mixed hyperlipidemia: Secondary | ICD-10-CM | POA: Diagnosis not present

## 2021-11-16 DIAGNOSIS — E559 Vitamin D deficiency, unspecified: Secondary | ICD-10-CM | POA: Diagnosis not present

## 2021-11-16 DIAGNOSIS — T162XXA Foreign body in left ear, initial encounter: Secondary | ICD-10-CM | POA: Insufficient documentation

## 2021-11-16 DIAGNOSIS — E538 Deficiency of other specified B group vitamins: Secondary | ICD-10-CM | POA: Diagnosis not present

## 2021-11-16 DIAGNOSIS — R7302 Impaired glucose tolerance (oral): Secondary | ICD-10-CM | POA: Diagnosis not present

## 2021-11-16 DIAGNOSIS — I10 Essential (primary) hypertension: Secondary | ICD-10-CM | POA: Diagnosis not present

## 2021-11-16 DIAGNOSIS — I2510 Atherosclerotic heart disease of native coronary artery without angina pectoris: Secondary | ICD-10-CM | POA: Diagnosis not present

## 2021-11-16 DIAGNOSIS — J449 Chronic obstructive pulmonary disease, unspecified: Secondary | ICD-10-CM | POA: Diagnosis not present

## 2021-11-22 DIAGNOSIS — J449 Chronic obstructive pulmonary disease, unspecified: Secondary | ICD-10-CM | POA: Diagnosis not present

## 2021-11-22 IMAGING — CT CT CHEST SUPER D W/O CM
2 of 5 series · 15 of 36 positions shown, 18 images · non-contrast
Comparison: CT chest lung cancer screening dated March 11, 2021

CLINICAL DATA: Follow-up right upper lobe pulmonary nodule

EXAM:
CT CHEST WITHOUT CONTRAST
TECHNIQUE: Multidetector CT imaging of the chest was performed using thin slice
collimation for electromagnetic bronchoscopy planning purposes,
without intravenous contrast.

[Series 4: chest 2.00 br40 s3 · coronal · 0.68mm/px · 3 of 162 slices shown]
[im 33/162  lung]
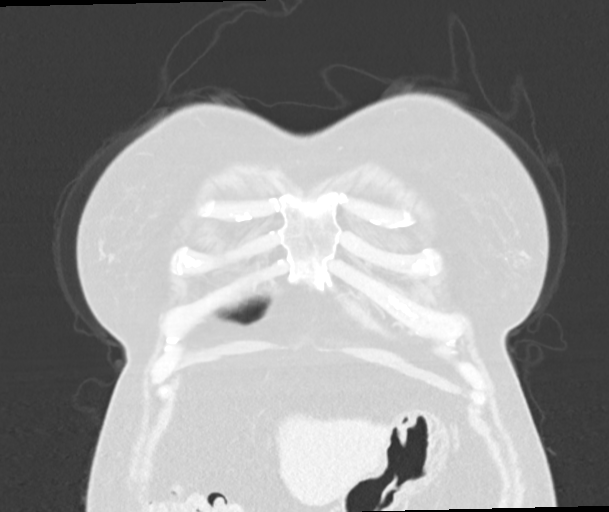
[im 65/162  lung]
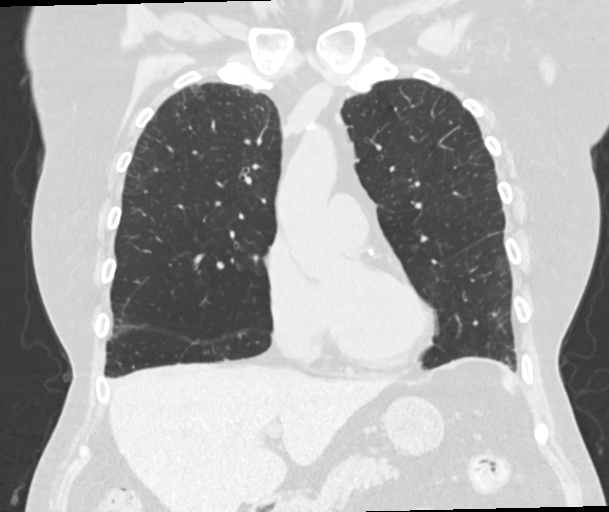
[im 97/162  lung]
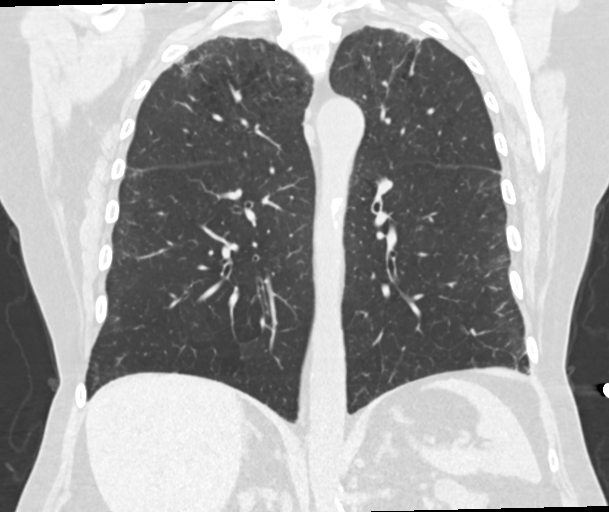

[Series 10: chest 1.00 br40 s3 super d · axial · 0.80mm/px · z∈[+1414,+1713]mm · 12 of 432 slices shown, 15 images]
[im 29/432  mediastinal]
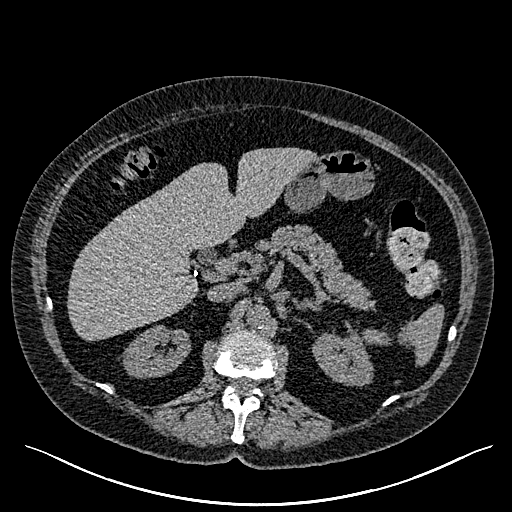
[im 29/432  lung]
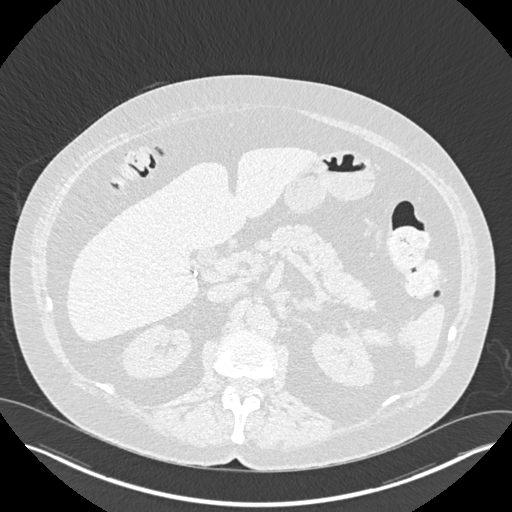
[im 58/432  lung]
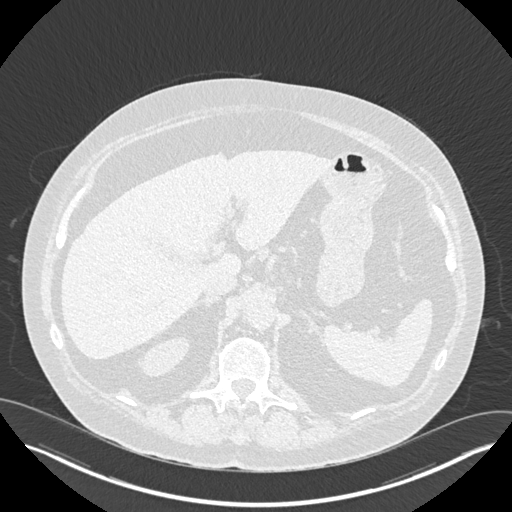
[im 87/432  lung]
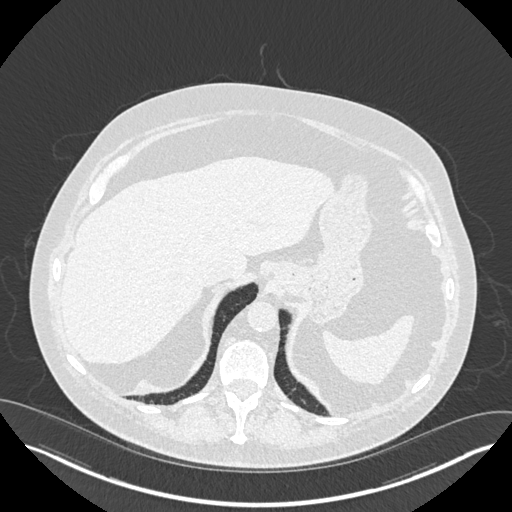
[im 144/432  lung]
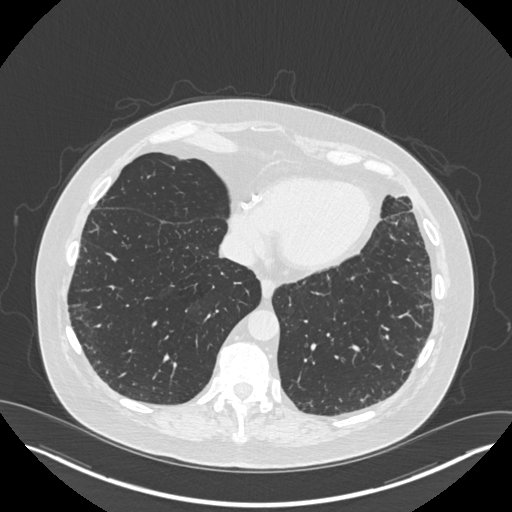
[im 173/432  mediastinal]
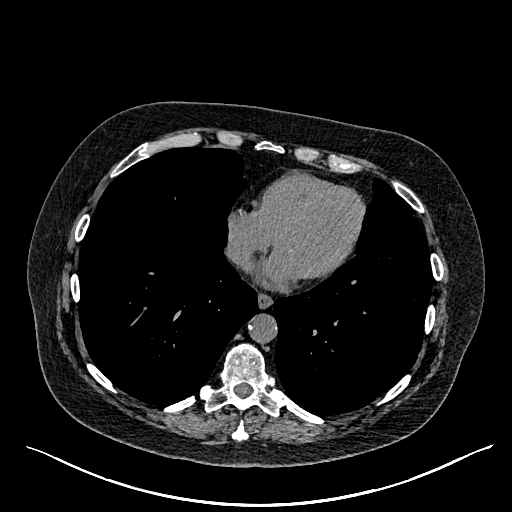
[im 173/432  lung]
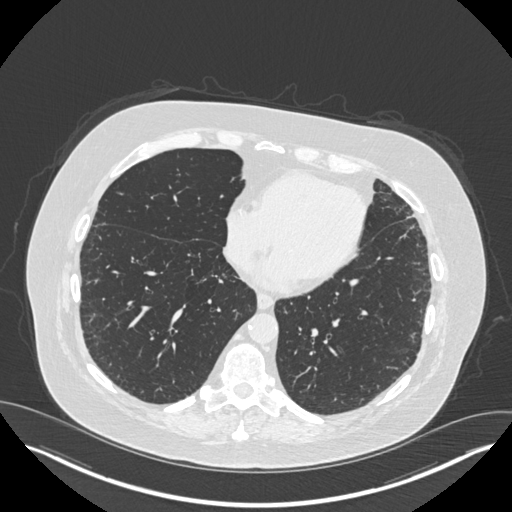
[im 202/432  lung]
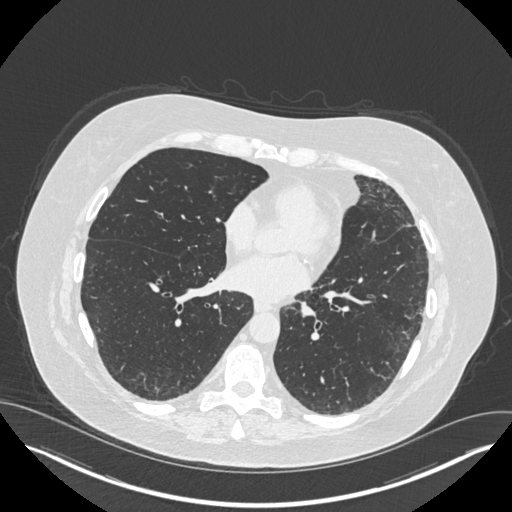
[im 230/432  lung]
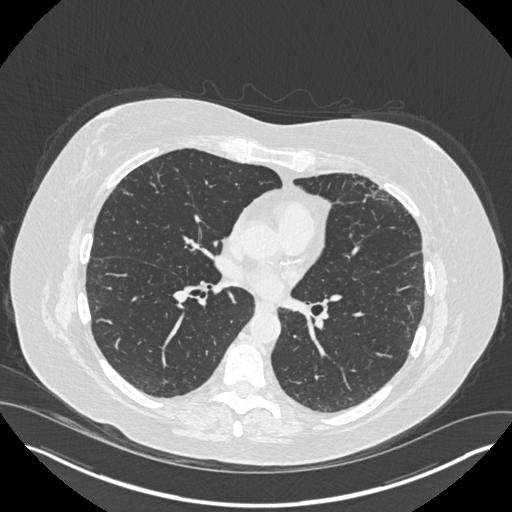
[im 259/432  lung]
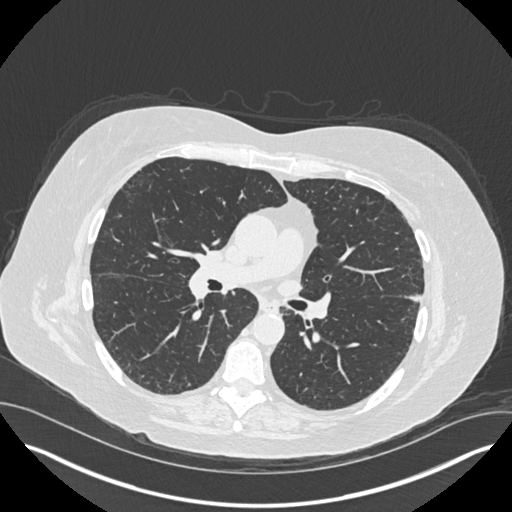
[im 288/432  mediastinal]
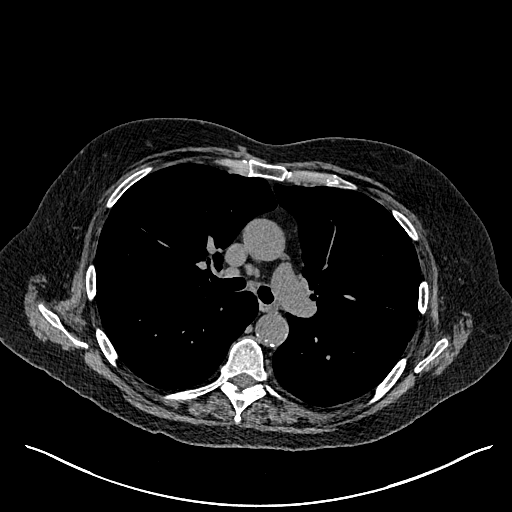
[im 288/432  lung]
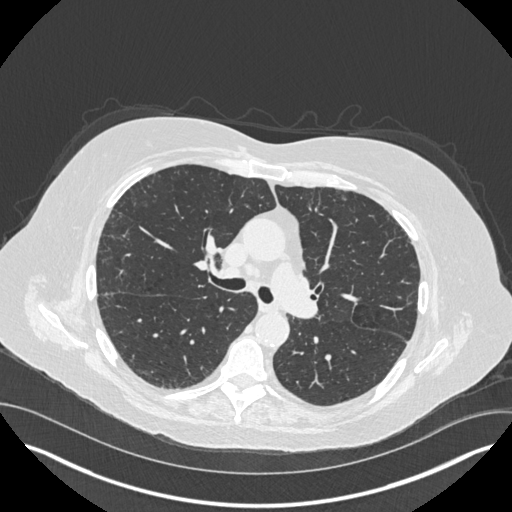
[im 345/432  lung]
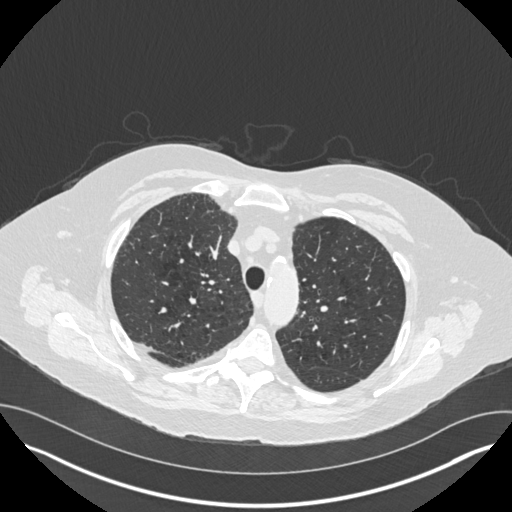
[im 374/432  lung]
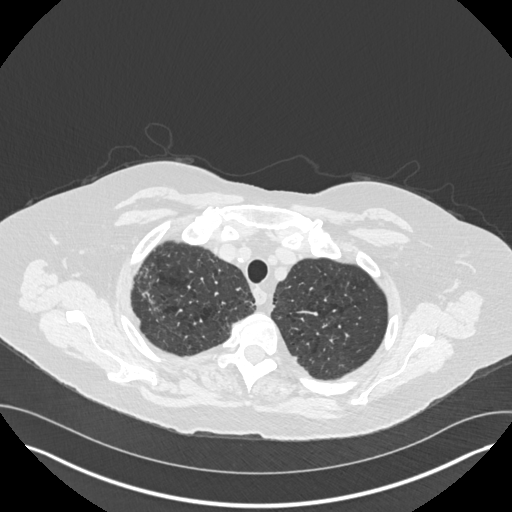
[im 403/432  lung]
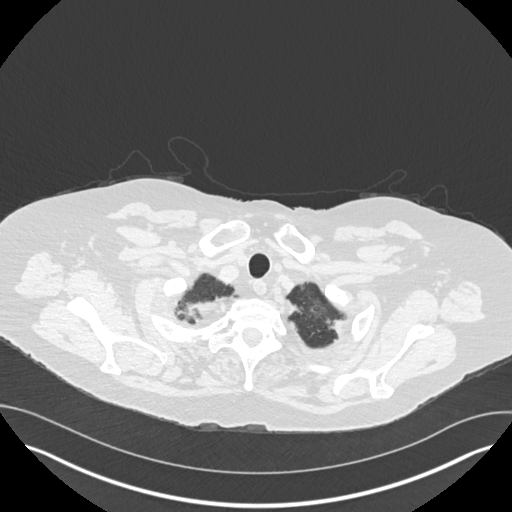

[15 of 36 positions shown; findings below may reference images not displayed]

FINDINGS: Cardiovascular: Normal heart size. No pericardial effusion.
Three-vessel coronary artery calcifications. Atherosclerotic disease
of the thoracic aorta.

Mediastinum/Nodes: Normal esophagus. No pathologically enlarged
lymph nodes seen in the chest.

Lungs/Pleura: Central airways are patent. Upper lobe predominant
centrilobular emphysema. Bilateral pleural-parenchymal scarring.
Subpleural nodular opacity of the right lung apex is unchanged
compared to prior and favored to represent pleural-parenchymal
scarring. No new or enlarging pulmonary nodules.

Upper Abdomen: Cholecystectomy clips.  No acute findings.

Musculoskeletal: No chest wall mass or suspicious bone lesions
identified.
IMPRESSION: Subpleural nodular opacity of the right lung apex is unchanged
compared to prior and favored to represent pleural-parenchymal
scarring. Recommend return to annual lung cancer screening.

Aortic Atherosclerosis (G59B1-9O5.5) and Emphysema (G59B1-8LK.W).

## 2021-12-23 DIAGNOSIS — J449 Chronic obstructive pulmonary disease, unspecified: Secondary | ICD-10-CM | POA: Diagnosis not present

## 2022-01-03 DIAGNOSIS — R0602 Shortness of breath: Secondary | ICD-10-CM | POA: Diagnosis not present

## 2022-01-03 DIAGNOSIS — R509 Fever, unspecified: Secondary | ICD-10-CM | POA: Diagnosis not present

## 2022-01-03 DIAGNOSIS — R059 Cough, unspecified: Secondary | ICD-10-CM | POA: Diagnosis not present

## 2022-01-03 DIAGNOSIS — U071 COVID-19: Secondary | ICD-10-CM | POA: Diagnosis not present

## 2022-01-03 DIAGNOSIS — J449 Chronic obstructive pulmonary disease, unspecified: Secondary | ICD-10-CM | POA: Diagnosis not present

## 2022-01-03 DIAGNOSIS — M791 Myalgia, unspecified site: Secondary | ICD-10-CM | POA: Diagnosis not present

## 2022-01-03 DIAGNOSIS — J069 Acute upper respiratory infection, unspecified: Secondary | ICD-10-CM | POA: Diagnosis not present

## 2022-01-07 ENCOUNTER — Other Ambulatory Visit: Payer: Self-pay

## 2022-01-07 ENCOUNTER — Emergency Department (HOSPITAL_COMMUNITY)
Admission: EM | Admit: 2022-01-07 | Discharge: 2022-01-07 | Disposition: A | Discharge status: Home / Self Care | Payer: Medicare Other | Attending: Emergency Medicine | Admitting: Emergency Medicine

## 2022-01-07 ENCOUNTER — Encounter (HOSPITAL_COMMUNITY): Payer: Self-pay

## 2022-01-07 ENCOUNTER — Emergency Department (HOSPITAL_COMMUNITY): Payer: Medicare Other

## 2022-01-07 DIAGNOSIS — J439 Emphysema, unspecified: Secondary | ICD-10-CM | POA: Diagnosis not present

## 2022-01-07 DIAGNOSIS — J441 Chronic obstructive pulmonary disease with (acute) exacerbation: Secondary | ICD-10-CM | POA: Diagnosis not present

## 2022-01-07 DIAGNOSIS — R0602 Shortness of breath: Secondary | ICD-10-CM | POA: Diagnosis not present

## 2022-01-07 DIAGNOSIS — U071 COVID-19: Secondary | ICD-10-CM | POA: Insufficient documentation

## 2022-01-07 DIAGNOSIS — Z7982 Long term (current) use of aspirin: Secondary | ICD-10-CM | POA: Diagnosis not present

## 2022-01-07 DIAGNOSIS — Z7951 Long term (current) use of inhaled steroids: Secondary | ICD-10-CM | POA: Insufficient documentation

## 2022-01-07 DIAGNOSIS — Z79899 Other long term (current) drug therapy: Secondary | ICD-10-CM | POA: Diagnosis not present

## 2022-01-07 LAB — CBC
HCT: 44.2 % (ref 36.0–46.0)
Hemoglobin: 14.6 g/dL (ref 12.0–15.0)
MCH: 30.7 pg (ref 26.0–34.0)
MCHC: 33 g/dL (ref 30.0–36.0)
MCV: 93.1 fL (ref 80.0–100.0)
Platelets: 266 10*3/uL (ref 150–400)
RBC: 4.75 MIL/uL (ref 3.87–5.11)
RDW: 12 % (ref 11.5–15.5)
WBC: 7.3 10*3/uL (ref 4.0–10.5)
nRBC: 0 % (ref 0.0–0.2)

## 2022-01-07 LAB — BASIC METABOLIC PANEL
Anion gap: 7 (ref 5–15)
BUN: 16 mg/dL (ref 8–23)
CO2: 30 mmol/L (ref 22–32)
Calcium: 9.1 mg/dL (ref 8.9–10.3)
Chloride: 104 mmol/L (ref 98–111)
Creatinine, Ser: 0.66 mg/dL (ref 0.44–1.00)
GFR, Estimated: >60 mL/min (ref >60)
Glucose, Bld: 97 mg/dL (ref 70–99)
Potassium: 4 mmol/L (ref 3.5–5.1)
Sodium: 141 mmol/L (ref 135–145)

## 2022-01-07 LAB — BRAIN NATRIURETIC PEPTIDE: B Natriuretic Peptide: 124 pg/mL — ABNORMAL HIGH (ref 0.0–100.0)

## 2022-01-07 LAB — TROPONIN I (HIGH SENSITIVITY): Troponin I (High Sensitivity): 6 ng/L (ref <18)

## 2022-01-07 MED ORDER — ACETAMINOPHEN 325 MG PO TABS
650.0000 mg | ORAL_TABLET | Freq: Once | ORAL | Status: AC
  Administered 2022-01-07: 650 mg via ORAL
  Filled 2022-01-07: qty 2

## 2022-01-07 MED ORDER — ALBUTEROL SULFATE HFA 108 (90 BASE) MCG/ACT IN AERS
1.0000 | INHALATION_SPRAY | Freq: Four times a day (QID) | RESPIRATORY_TRACT | Status: DC | PRN
  Administered 2022-01-07: 1 via RESPIRATORY_TRACT
  Filled 2022-01-07: qty 6.7

## 2022-01-07 MED ORDER — PREDNISONE 50 MG PO TABS: 50.0000 mg | ORAL_TABLET | Freq: Every day | ORAL | 0 refills | Status: DC

## 2022-01-07 MED ORDER — METHYLPREDNISOLONE SODIUM SUCC 125 MG IJ SOLR
125.0000 mg | Freq: Once | INTRAMUSCULAR | Status: AC
  Administered 2022-01-07: 125 mg via INTRAVENOUS
  Filled 2022-01-07: qty 2

## 2022-01-07 MED ORDER — ALBUTEROL SULFATE (2.5 MG/3ML) 0.083% IN NEBU
5.0000 mg | INHALATION_SOLUTION | RESPIRATORY_TRACT | Status: DC | PRN
  Administered 2022-01-07: 5 mg via RESPIRATORY_TRACT
  Filled 2022-01-07: qty 6

## 2022-01-07 MED ORDER — IPRATROPIUM BROMIDE 0.02 % IN SOLN
0.5000 mg | Freq: Once | RESPIRATORY_TRACT | Status: AC
  Administered 2022-01-07: 0.5 mg via RESPIRATORY_TRACT
  Filled 2022-01-07: qty 2.5

## 2022-01-07 NOTE — ED Notes (Signed)
Patient currently on 3L [place] at 93% ?

## 2022-01-07 NOTE — ED Provider Notes (Signed)
[facility] EMERGENCY DEPARTMENT Provider Note   CSN: [id number] Arrival date & time: 01/07/22  1943     History  Chief Complaint  Patient presents with   Shortness of Breath    Tabitha Schmidt is a 66 y.o. female.   Shortness of Breath Associated symptoms: no fever    Patient presents to the ED for evaluation of shortness of breath.  Patient states she started having symptoms over the weekend.  She went to the doctor's office and tested positive on Monday for covid.  She was given a prescription for paxlovid.  Patient has previously been vaccinated and boosted.  Patient has had history of COPD and normally just uses 2 L nasal cannula at night.  Last few days she has had increasing shortness of breath.  She has been having to use her oxygen more regularly.  Patient also started to have some pain in her back that is sharp.  She has discomfort with coughing.  She has not had any leg swelling.  No known fevers.  Home Medications Prior to Admission medications   Medication Sig Start Date End Date Taking? Authorizing Provider  predniSONE (DELTASONE) 50 MG tablet Take 1 tablet (50 mg total) by mouth daily. 01/07/22  Yes Linwood Dibbles, MD  aspirin EC 81 MG EC tablet Take 1 tablet (81 mg total) by mouth daily. 05/15/19   Vassie Loll, MD  atorvastatin (LIPITOR) 80 MG tablet TAKE ONE TABLET BY MOUTH AT 6PM. 09/30/20   Antoine Poche, MD  BREZTRI AEROSPHERE 160-9-4.8 MCG/ACT AERO INHALE (2) PUFFS TWICEODAILY FOR COPD RINSE MOUTH AFTER INHALATION. 03/19/20   [provider]  FLUoxetine (PROZAC) 20 MG capsule Take 20 mg by mouth daily. 01/25/21   [provider]  LORazepam (ATIVAN) 0.5 MG tablet Take 0.5 mg by mouth 4 (four) times daily as needed. 06/29/20   [provider]  meclizine (ANTIVERT) 25 MG tablet Take 25 mg by mouth every 6 (six) hours. 02/17/20   [provider]  metoprolol tartrate (LOPRESSOR) 25 MG tablet Take 0.5 tablets (12.5 mg total) by mouth 2  (two) times daily. 04/21/20   Laqueta Linden, MD  NALTREXONE HCL PO Take 4.5 mg by mouth every morning.    [provider]  nitroGLYCERIN (NITROSTAT) 0.4 MG SL tablet Place 1 tablet (0.4 mg total) under the tongue every 5 (five) minutes as needed for chest pain. 02/10/21 05/11/21  Wendall Stade, MD  PROAIR HFA 108 [phone] Base) MCG/ACT inhaler Inhale 1 puff into the lungs every 6 (six) hours as needed for wheezing or shortness of breath.  01/15/19   [provider]  RESTASIS 0.05 % ophthalmic emulsion 1 drop 2 (two) times daily. 01/27/21   [provider]  Suvorexant 5 MG TABS Take 5 mg by mouth daily.    [provider]  thyroid (ARMOUR) 90 MG tablet Take 90 mg by mouth daily.    [provider]      Allergies    Zegerid [omeprazole-sodium bicarbonate]    Review of Systems   Review of Systems  Constitutional:  Negative for fever.  Respiratory:  Positive for shortness of breath.    Physical Exam Updated Vital Signs BP (!) 135/53    Pulse 73    Temp 98.1 F (36.7 C)    Resp 16    Ht 1.702 m (5\' 7" )    Wt 95.3 kg    SpO2 100%    BMI 32.89 kg/m  Physical Exam  Vitals and nursing note reviewed.  Constitutional:      Appearance: She is well-developed. She is not diaphoretic.  HENT:     Head: Normocephalic and atraumatic.     Right Ear: External ear normal.     Left Ear: External ear normal.  Eyes:     General: No scleral icterus.       Right eye: No discharge.        Left eye: No discharge.     Conjunctiva/sclera: Conjunctivae normal.  Neck:     Trachea: No tracheal deviation.  Cardiovascular:     Rate and Rhythm: Normal rate and regular rhythm.  Pulmonary:     Effort: Pulmonary effort is normal. No respiratory distress.     Breath sounds: No stridor. Wheezing present. No rales.  Abdominal:     General: Bowel sounds are normal. There is no distension.     Palpations: Abdomen is soft.     Tenderness: There is no abdominal tenderness.  There is no guarding or rebound.  Musculoskeletal:        General: No tenderness or deformity.     Cervical back: Neck supple.  Skin:    General: Skin is warm and dry.     Findings: No rash.  Neurological:     General: No focal deficit present.     Mental Status: She is alert.     Cranial Nerves: No cranial nerve deficit (no facial droop, extraocular movements intact, no slurred speech).     Sensory: No sensory deficit.     Motor: No abnormal muscle tone or seizure activity.     Coordination: Coordination normal.  Psychiatric:        Mood and Affect: Mood normal.    ED Results / Procedures / Treatments   Labs (all labs ordered are listed, but only abnormal results are displayed) Labs Reviewed  BRAIN NATRIURETIC PEPTIDE - Abnormal; Notable for the following components:      Result Value   B Natriuretic Peptide 124.0 (*)    All other components within normal limits  CBC  BASIC METABOLIC PANEL  TROPONIN I (HIGH SENSITIVITY)    EKG EKG Interpretation  Date/Time:  Friday January 07 2022 20:14:27 EST Ventricular Rate:  62 PR Interval:  150 QRS Duration: 87 QT Interval:  414 QTC Calculation: 421 R Axis:   59 Text Interpretation: Sinus rhythm No significant change since last tracing Confirmed by Linwood Dibbles [phone]) on 01/07/2022 9:19:32 PM  Radiology DG Chest Portable 1 View  Result Date: 01/07/2022 CLINICAL DATA:  Dyspnea with recent positive COVID test. EXAM: PORTABLE CHEST 1 VIEW COMPARISON:  Chest CT 07/15/2021. FINDINGS: Cardiac size is normal. There is a normal mediastinal configuration with atherosclerosis of the arch. Lungs are emphysematous with mild subpleural reticulation in the lower lung zones again noted consistent with chronic change. No pleural effusion is seen. There is thoracic spondylosis. IMPRESSION: No evidence of acute chest disease. Stable COPD chest chronic interstitial change. Electronically Signed   By: Almira Bar M.D.   On: 01/07/2022 20:51     Procedures Procedures    Medications Ordered in ED Medications  albuterol (PROVENTIL) (2.5 MG/3ML) 0.083% nebulizer solution 5 mg (5 mg Nebulization Given 01/07/22 2040)  albuterol (VENTOLIN HFA) 108 (90 Base) MCG/ACT inhaler 1-2 puff (has no administration in time range)  ipratropium (ATROVENT) nebulizer solution 0.5 mg (0.5 mg Nebulization Given 01/07/22 2040)  methylPREDNISolone sodium succinate (SOLU-MEDROL) 125 mg/2 mL injection 125 mg (125 mg Intravenous Given 01/07/22 2051)  acetaminophen (TYLENOL) tablet 650 mg (650 mg Oral Given 01/07/22 2124)    ED Course/ Medical Decision Making/ A&P Clinical Course as of 01/07/22 2233  Caleen Essex Jan 07, 2022  2159 Brain natriuretic peptide(!) Normal [JK]  2159 Troponin I (High Sensitivity) [JK]  2159 Basic metabolic panel [JK]  2159 Normal [JK]  2159 CBC Normal [JK]  2159 DG Chest Portable 1 View Chest x-ray images and radiology report reviewed.  No acute pneumonia [JK]  2200 On repeat exam lungs are clear.  Patient is breathing much more easily now [JK]    Clinical Course User Index [JK] Linwood Dibbles, MD                           Medical Decision Making Amount and/or Complexity of Data Reviewed Labs: ordered. Decision-making details documented in ED Course. Radiology: ordered. Decision-making details documented in ED Course.  Risk OTC drugs. Prescription drug management.   Patient presented to the ED for evaluation of shortness of breath.  Patient does have a history of COPD.  She was also recently diagnosed with COVID.  On exam patient did have wheezing.  She was given breathing treatments and steroids.  Symptoms have improved significantly at this point.  She does have supplemental oxygen at home and is oxygenating well on nasal cannula oxygen.  I do not feel that she requires admission at this time for COPD exacerbation.  Patient feels comfortable with discharge.  I will give her an albuterol inhaler as her nebulizer machine was not  working at home.  We will also give her prescription for steroids.  Warning signs precautions discussed.  Evaluation and diagnostic testing in the emergency department does not suggest an emergent condition requiring admission or immediate intervention beyond what has been performed at this time.  The patient is safe for discharge and has been instructed to return immediately for worsening symptoms, change in symptoms or any other concerns.         Final Clinical Impression(s) / ED Diagnoses Final diagnoses:  COPD exacerbation (HCC)  COVID-19 virus infection    Rx / DC Orders ED Discharge Orders          Ordered    predniSONE (DELTASONE) 50 MG tablet  Daily        01/07/22 2232              Linwood Dibbles, MD 01/07/22 2233

## 2022-01-07 NOTE — ED Triage Notes (Signed)
Pt tested positive for COVID on Monday and is having shortness of breath. Pt was COPD and wears 2L at all times. Pt O2 is 93% on room air. Endorses headache. ?

## 2022-01-07 NOTE — Discharge Instructions (Addendum)
Use the inhaler until you can get your nebulizer machine fixed.  Take the steroids as prescribed.  Follow-up with your doctor to be rechecked.  Return as needed for worsening symptoms ?

## 2022-01-07 NOTE — ED Notes (Signed)
Breathing treatment in process

## 2022-01-10 ENCOUNTER — Other Ambulatory Visit (HOSPITAL_COMMUNITY): Payer: Self-pay | Admitting: Family Medicine

## 2022-01-10 DIAGNOSIS — J069 Acute upper respiratory infection, unspecified: Secondary | ICD-10-CM

## 2022-01-13 DIAGNOSIS — J449 Chronic obstructive pulmonary disease, unspecified: Secondary | ICD-10-CM | POA: Diagnosis not present

## 2022-01-14 DIAGNOSIS — U071 COVID-19: Secondary | ICD-10-CM | POA: Diagnosis not present

## 2022-01-14 DIAGNOSIS — J449 Chronic obstructive pulmonary disease, unspecified: Secondary | ICD-10-CM | POA: Diagnosis not present

## 2022-01-20 DIAGNOSIS — J449 Chronic obstructive pulmonary disease, unspecified: Secondary | ICD-10-CM | POA: Diagnosis not present

## 2022-02-04 NOTE — Progress Notes (Signed)
CARDIOLOGY CONSULT NOTE  ? ? ? ? ? ?Patient ID: ?Tabitha Schmidt ?MRN: [id number] ?DOB/AGE: November 13, 1955 66 y.o. ? ?Admit date: (Not on file) ?Referring Physician: [name] ?Primary Physician: [name], MD ?Primary Cardiologist: [name] ?Reason for Consultation: Palpitations ? ? ?HPI:  66 y.o. referred by Dr [name] for palpitations 02/10/21 . She has been followed in past by [facility]. She has a history of right carotid stenting Known CAD with cath 05/18/19 for NSTEMI showing occluded mid/distal RCA stented with good results and no disease in left system EF 50-55% Last carotid duplex 09/02/19 with patent right stent and 40-59% Left ICA stenosis  She is still smoking  I see her son She sees endocrine for hypothyroidism on replacement Her TSH has been suppressed repeatedly over the last year most recently 0.20 on 11/04/20 Replacement changed to Tirosint 09/30/20  ? ?She has an 26 yo grand daughter "adopted" living with her with severe mental health issues  Depression bipolar suicidal at times  ? ?Counseled on smoking cessation < 10 minutes  Lung cancer CT 03/13/21 suspicious subpleural nodular opacity in posterior apical RUL Consider PET/ CT in 3 months Repeat CT stable and seeing Dr [name] in pulmonary  ? ?She indicates having f/u for [place] and visit with [facility] ? ?Seen in ED 01/07/22 with dyspnea COVID positive Given Paxlovid She has had vaccines and booster COPD on oxygen at night 2L's Rx steroids/ nebs CXR NAD just chronic emphysema ?BNP only 124 ? ?She has a funny sensation in her head at times with pounding and "wooshing" sound but no rapid heart beats Feels better when she takes her lopressor tid  ? ?ROS ?All other systems reviewed and negative except as noted above ? ?Past Medical History:  ?Diagnosis Date  ? Anxiety   ? Arthritis   ? Asthma   ? Cataracts, bilateral   ? [name]  ? Complication of anesthesia   ? Note: pt has hx of TMJ " every now and then my jaw will lock "  ? COPD (chronic obstructive pulmonary disease) (HCC)   ?  Coronary artery disease   ? a. s/p DES to mid-RCA in 05/2019  ? DM (diabetes mellitus) (HCC)   ? "came off medicines with dr's permission"  ? Family history of adverse reaction to anesthesia   ? " my son woke up crying "  ? Fatty liver   ? Fibromyalgia   ? GERD (gastroesophageal reflux disease)   ? Hyperlipidemia   ? stopped meds on her own  ? Hypothyroid   ? Myocardial infarction [facility])   ? On supplemental oxygen therapy   ? at night and PRN  ? Pneumonia   ? Shortness of breath   ? Stenosis of right carotid artery   ? TMJ (dislocation of temporomandibular joint)   ?  ?Family History  ?Problem Relation Age of Onset  ? CAD Father   ? Anesthesia problems Neg Hx   ? Hypotension Neg Hx   ? Malignant hyperthermia Neg Hx   ? Pseudochol deficiency Neg Hx   ?  ?Social History  ? ?Socioeconomic History  ? Marital status: Single  ?  Spouse name: Not on file  ? Number of children: Not on file  ? Years of education: Not on file  ? Highest education level: Not on file  ?Occupational History  ? Not on file  ?Tobacco Use  ? Smoking status: Every Day  ?  Packs/day: 0.50  ?  Years: 40.00  ?  Pack  years: 20.00  ?  Types: Cigarettes  ? Smokeless tobacco: Never  ?Vaping Use  ? Vaping Use: Never used  ?Substance and Sexual Activity  ? Alcohol use: No  ? Drug use: No  ?  Types: Cocaine  ? Sexual activity: Yes  ?  Birth control/protection: Surgical  ?Other Topics Concern  ? Not on file  ?Social History Narrative  ? Not on file  ? ?Social Determinants of Health  ? ?Financial Resource Strain: Not on file  ?Food Insecurity: Not on file  ?Transportation Needs: Not on file  ?Physical Activity: Not on file  ?Stress: Not on file  ?Social Connections: Not on file  ?Intimate Partner Violence: Not on file  ?  ?Past Surgical History:  ?Procedure Laterality Date  ? ABDOMINAL HYSTERECTOMY    ? BREAST SURGERY    ? removal of bilateral benign tumors  ? CARDIAC CATHETERIZATION    ? 2010  ? CHOLECYSTECTOMY    ? CORONARY STENT INTERVENTION N/A 05/18/2019  ?  Procedure: CORONARY STENT INTERVENTION;  Surgeon: Runell Gess, MD;  Location: [facility] INVASIVE CV LAB;  Service: Cardiovascular;  Laterality: N/A;  ? DILATION AND CURETTAGE OF UTERUS    ? ESOPHAGOGASTRODUODENOSCOPY  01/26/2012  ? Procedure: ESOPHAGOGASTRODUODENOSCOPY (EGD);  Surgeon: Malissa Hippo, MD;  Location: [facility] ENDO SUITE;  Service: Endoscopy;  Laterality: N/A;  730  ? LEFT HEART CATH AND CORONARY ANGIOGRAPHY N/A 05/18/2019  ? Procedure: LEFT HEART CATH AND CORONARY ANGIOGRAPHY;  Surgeon: Runell Gess, MD;  Location: [facility] INVASIVE CV LAB;  Service: Cardiovascular;  Laterality: N/A;  ? TRANSCAROTID ARTERY REVASCULARIZATION?  Right 08/02/2019  ? Procedure: TRANSCAROTID ARTERY REVASCULARIZATION RIGHT;  Surgeon: Nada Libman, MD;  Location: [facility] OR;  Service: Vascular;  Laterality: Right;  ? TUBAL LIGATION    ?  ? ? ?Current Outpatient Medications:  ?  aspirin EC 81 MG EC tablet, Take 1 tablet (81 mg total) by mouth daily., Disp: 30 tablet, Rfl: 3 ?  atorvastatin (LIPITOR) 80 MG tablet, TAKE ONE TABLET BY MOUTH AT 6PM., Disp: 90 tablet, Rfl: 0 ?  BREZTRI AEROSPHERE 160-9-4.8 MCG/ACT AERO, INHALE (2) PUFFS TWICEODAILY FOR COPD RINSE MOUTH AFTER INHALATION., Disp: , Rfl:  ?  FLUoxetine (PROZAC) 20 MG capsule, Take 20 mg by mouth daily., Disp: , Rfl:  ?  hydrochlorothiazide (HYDRODIURIL) 12.5 MG tablet, Take 12.5 mg by mouth daily., Disp: , Rfl:  ?  LORazepam (ATIVAN) 0.5 MG tablet, Take 0.5 mg by mouth 4 (four) times daily as needed., Disp: , Rfl:  ?  meclizine (ANTIVERT) 25 MG tablet, Take 25 mg by mouth every 6 (six) hours., Disp: , Rfl:  ?  metoprolol tartrate (LOPRESSOR) 25 MG tablet, Take 0.5 tablets (12.5 mg total) by mouth 2 (two) times daily. (Patient taking differently: Take 25 mg by mouth 2 (two) times daily.), Disp: 30 tablet, Rfl: 10 ?  NALTREXONE HCL PO, Take 4.5 mg by mouth every morning., Disp: , Rfl:  ?  nitroGLYCERIN (NITROSTAT) 0.4 MG SL tablet, Place 1 tablet (0.4 mg total) under the tongue  every 5 (five) minutes as needed for chest pain., Disp: 25 tablet, Rfl: 3 ?  PROAIR HFA 108 (90 Base) MCG/ACT inhaler, Inhale 1 puff into the lungs every 6 (six) hours as needed for wheezing or shortness of breath. , Disp: , Rfl:  ?  RESTASIS 0.05 % ophthalmic emulsion, 1 drop 2 (two) times daily., Disp: , Rfl:  ?  Suvorexant 5 MG TABS, Take 5 mg by mouth daily.,  Disp: , Rfl:  ?  thyroid (ARMOUR) 90 MG tablet, Take 90 mg by mouth daily., Disp: , Rfl:  ? ? ? ?Physical Exam: ?Blood pressure 130/70, pulse 86, height 5\' 7"  (1.702 m), weight 203 lb (92.1 kg), SpO2 96 %.   ? ?Affect appropriate ?Healthy:  appears stated age ?HEENT: normal ?Neck supple with no adenopathy ?JVP normal no bruits no thyromegaly ?Lungs clear with no wheezing and good diaphragmatic motion ?Heart:  S1/S2 no murmur, no rub, gallop or click ?PMI normal ?Abdomen: benighn, BS positve, no tenderness, no AAA ?no bruit.  No HSM or HJR ?Distal pulses intact with no bruits ?No edema ?Neuro non-focal ?Skin warm and dry ?No muscular weakness ? ? ?Labs: ?  ?Lab Results  ?Component Value Date  ? WBC 7.3 01/07/2022  ? HGB 14.6 01/07/2022  ? HCT 44.2 01/07/2022  ? MCV 93.1 01/07/2022  ? PLT 266 01/07/2022  ? No results for input(s): NA, K, CL, CO2, BUN, CREATININE, CALCIUM, PROT, BILITOT, ALKPHOS, ALT, AST, GLUCOSE in the last 168 hours. ? ?Invalid input(s): LABALBU ?No results found for: CKTOTAL, CKMB, CKMBINDEX, TROPONINI  ?Lab Results  ?Component Value Date  ? CHOL 181 05/14/2019  ? ?Lab Results  ?Component Value Date  ? HDL 35 (L) 05/14/2019  ? ?Lab Results  ?Component Value Date  ? LDLCALC 113 (H) 05/14/2019  ? ?Lab Results  ?Component Value Date  ? TRIG 167 (H) 05/14/2019  ? ?Lab Results  ?Component Value Date  ? CHOLHDL 5.2 05/14/2019  ? ?No results found for: LDLDIRECT  ?  ?Radiology: ?No results found. ? ?EKG: 05/20/19 SR lateral T wave changes 02/11/2022 SB rate 64 normal  ? ? ?ASSESSMENT AND PLAN:  ? ?1. CAD:  Stenting of occluded mid/distal RCA July  2020 Continue ASA/statin/beta blocker No residual left sided disease no need for stress testing now  ?2. HLD on statin labs with primary  ?3. Smoking:  Counseled on cessation has had Chantix before F/U Icard fo

## 2022-02-11 ENCOUNTER — Ambulatory Visit (INDEPENDENT_AMBULATORY_CARE_PROVIDER_SITE_OTHER): Payer: Medicare Other | Admitting: Cardiovascular Disease

## 2022-02-11 ENCOUNTER — Encounter: Payer: Self-pay | Admitting: Cardiovascular Disease

## 2022-02-11 VITALS — BP 130/70 | HR 86 | Ht 67.0 in | Wt 203.0 lb

## 2022-02-11 DIAGNOSIS — R0989 Other specified symptoms and signs involving the circulatory and respiratory systems: Secondary | ICD-10-CM

## 2022-02-11 DIAGNOSIS — I25118 Atherosclerotic heart disease of native coronary artery with other forms of angina pectoris: Secondary | ICD-10-CM

## 2022-02-11 DIAGNOSIS — Z95828 Presence of other vascular implants and grafts: Secondary | ICD-10-CM

## 2022-02-11 DIAGNOSIS — Z9889 Other specified postprocedural states: Secondary | ICD-10-CM | POA: Diagnosis not present

## 2022-02-11 DIAGNOSIS — E785 Hyperlipidemia, unspecified: Secondary | ICD-10-CM | POA: Diagnosis not present

## 2022-02-11 DIAGNOSIS — Z720 Tobacco use: Secondary | ICD-10-CM

## 2022-02-11 DIAGNOSIS — I6523 Occlusion and stenosis of bilateral carotid arteries: Secondary | ICD-10-CM | POA: Diagnosis not present

## 2022-02-11 DIAGNOSIS — R002 Palpitations: Secondary | ICD-10-CM | POA: Diagnosis not present

## 2022-02-11 DIAGNOSIS — E039 Hypothyroidism, unspecified: Secondary | ICD-10-CM | POA: Diagnosis not present

## 2022-02-11 MED ORDER — METOPROLOL TARTRATE 25 MG PO TABS: 25.0000 mg | ORAL_TABLET | Freq: Two times a day (BID) | ORAL | 3 refills | Status: DC

## 2022-02-11 MED ORDER — METOPROLOL TARTRATE 25 MG PO TABS: 25.0000 mg | ORAL_TABLET | Freq: Three times a day (TID) | ORAL | 3 refills | Status: DC

## 2022-02-11 NOTE — Patient Instructions (Addendum)
Medication Instructions:  ?Your physician has recommended you make the following change in your medication:  ? ?Increase Lopressor to 25 mg Three Times Daily  ? ?*If you need a refill on your cardiac medications before your next appointment, please call your pharmacy* ? ? ?Lab Work: ?Your physician recommends that you return for lab work in: Today( TSH, T3, T4)  ? ?If you have labs (blood work) drawn today and your tests are completely normal, you will receive your results only by: ?MyChart Message (if you have MyChart) OR ?A paper copy in the mail ?If you have any lab test that is abnormal or we need to change your treatment, we will call you to review the results. ? ? ?Testing/Procedures: ?Your physician has requested that you have a carotid duplex. This test is an ultrasound of the carotid arteries in your neck. It looks at blood flow through these arteries that supply the brain with blood. Allow one hour for this exam. There are no restrictions or special instructions.  ? ? ?Follow-Up: ?At [facility], you and your health needs are our priority.  As part of our continuing mission to provide you with exceptional heart care, we have created designated Provider Care Teams.  These Care Teams include your primary Cardiologist (physician) and Advanced Practice Providers (APPs -  Physician Assistants and Nurse Practitioners) who all work together to provide you with the care you need, when you need it. ? ?We recommend signing up for the patient portal called "MyChart".  Sign up information is provided on this After Visit Summary.  MyChart is used to connect with patients for Virtual Visits (Telemedicine).  Patients are able to view lab/test results, encounter notes, upcoming appointments, etc.  Non-urgent messages can be sent to your provider as well.   ?To learn more about what you can do with MyChart, go to [email or web address].   ? ?Your next appointment:   ?1 year(s) ? ?The format for your next appointment:    ?In Person ? ?Provider:   ?Charlton Haws, MD  ? ? ?Other Instructions ?Thank you for choosing [facility]! ? ? ? ?Important Information About Sugar ? ? ? ? ? ? ?

## 2022-02-13 DIAGNOSIS — J449 Chronic obstructive pulmonary disease, unspecified: Secondary | ICD-10-CM | POA: Diagnosis not present

## 2022-02-14 ENCOUNTER — Telehealth: Payer: Self-pay | Admitting: Cardiovascular Disease

## 2022-02-14 DIAGNOSIS — I10 Essential (primary) hypertension: Secondary | ICD-10-CM | POA: Diagnosis not present

## 2022-02-14 DIAGNOSIS — E039 Hypothyroidism, unspecified: Secondary | ICD-10-CM | POA: Diagnosis not present

## 2022-02-14 DIAGNOSIS — R7303 Prediabetes: Secondary | ICD-10-CM | POA: Diagnosis not present

## 2022-02-14 DIAGNOSIS — E538 Deficiency of other specified B group vitamins: Secondary | ICD-10-CM | POA: Diagnosis not present

## 2022-02-14 DIAGNOSIS — E559 Vitamin D deficiency, unspecified: Secondary | ICD-10-CM | POA: Diagnosis not present

## 2022-02-14 NOTE — Telephone Encounter (Signed)
Spoke with pt who had labs drawn today by her PCP's office. She informed them that Dr. Eden Emms wanted a TSH, T3 and T4. All labs were drawn and pt states that when she see's her PCP she will bring a copy of labs to our office for review.  ?

## 2022-02-14 NOTE — Addendum Note (Signed)
Addended by: Barbarann Ehlers A on: 02/14/2022 09:45 AM ? ? Modules accepted: Orders ? ?

## 2022-02-14 NOTE — Telephone Encounter (Signed)
Patient states she will be seeing Dr. [name], PCP, today and plans to have lab work with his office. She would like to know if she can come by the office to pick up the orders placed by Dr. Eden Emms. She wants to all all her labs completed at Dr. Scharlene Gloss office if possible. She also has questions regarding her most recent EKG. ?

## 2022-02-16 DIAGNOSIS — E782 Mixed hyperlipidemia: Secondary | ICD-10-CM | POA: Diagnosis not present

## 2022-02-16 DIAGNOSIS — M1990 Unspecified osteoarthritis, unspecified site: Secondary | ICD-10-CM | POA: Diagnosis not present

## 2022-02-16 DIAGNOSIS — E538 Deficiency of other specified B group vitamins: Secondary | ICD-10-CM | POA: Diagnosis not present

## 2022-02-16 DIAGNOSIS — E559 Vitamin D deficiency, unspecified: Secondary | ICD-10-CM | POA: Diagnosis not present

## 2022-02-16 DIAGNOSIS — R232 Flushing: Secondary | ICD-10-CM | POA: Diagnosis not present

## 2022-02-16 DIAGNOSIS — E039 Hypothyroidism, unspecified: Secondary | ICD-10-CM | POA: Diagnosis not present

## 2022-02-16 DIAGNOSIS — R7303 Prediabetes: Secondary | ICD-10-CM | POA: Diagnosis not present

## 2022-02-16 DIAGNOSIS — I2510 Atherosclerotic heart disease of native coronary artery without angina pectoris: Secondary | ICD-10-CM | POA: Diagnosis not present

## 2022-02-16 DIAGNOSIS — I10 Essential (primary) hypertension: Secondary | ICD-10-CM | POA: Diagnosis not present

## 2022-02-16 DIAGNOSIS — J449 Chronic obstructive pulmonary disease, unspecified: Secondary | ICD-10-CM | POA: Diagnosis not present

## 2022-02-20 DIAGNOSIS — J449 Chronic obstructive pulmonary disease, unspecified: Secondary | ICD-10-CM | POA: Diagnosis not present

## 2022-02-23 ENCOUNTER — Ambulatory Visit (HOSPITAL_COMMUNITY)
Admission: RE | Admit: 2022-02-23 | Discharge: 2022-02-23 | Disposition: A | Discharge status: Home / Self Care | Payer: Medicare Other | Source: Ambulatory Visit | Attending: Cardiovascular Disease | Admitting: Cardiovascular Disease

## 2022-02-23 DIAGNOSIS — I6523 Occlusion and stenosis of bilateral carotid arteries: Secondary | ICD-10-CM | POA: Insufficient documentation

## 2022-02-23 DIAGNOSIS — I771 Stricture of artery: Secondary | ICD-10-CM | POA: Diagnosis not present

## 2022-02-23 DIAGNOSIS — R0989 Other specified symptoms and signs involving the circulatory and respiratory systems: Secondary | ICD-10-CM | POA: Insufficient documentation

## 2022-02-23 DIAGNOSIS — I6522 Occlusion and stenosis of left carotid artery: Secondary | ICD-10-CM | POA: Diagnosis not present

## 2022-03-15 DIAGNOSIS — J449 Chronic obstructive pulmonary disease, unspecified: Secondary | ICD-10-CM | POA: Diagnosis not present

## 2022-03-18 DIAGNOSIS — J441 Chronic obstructive pulmonary disease with (acute) exacerbation: Secondary | ICD-10-CM | POA: Diagnosis not present

## 2022-03-18 DIAGNOSIS — J449 Chronic obstructive pulmonary disease, unspecified: Secondary | ICD-10-CM | POA: Diagnosis not present

## 2022-03-18 DIAGNOSIS — M545 Low back pain, unspecified: Secondary | ICD-10-CM | POA: Diagnosis not present

## 2022-03-22 DIAGNOSIS — J449 Chronic obstructive pulmonary disease, unspecified: Secondary | ICD-10-CM | POA: Diagnosis not present

## 2022-04-12 ENCOUNTER — Encounter (HOSPITAL_COMMUNITY): Payer: Self-pay | Admitting: Emergency Medicine

## 2022-04-12 ENCOUNTER — Emergency Department (HOSPITAL_COMMUNITY)
Admission: EM | Admit: 2022-04-12 | Discharge: 2022-04-12 | Disposition: A | Discharge status: Home / Self Care | Payer: Medicare Other | Attending: Emergency Medicine | Admitting: Emergency Medicine

## 2022-04-12 ENCOUNTER — Emergency Department (HOSPITAL_COMMUNITY): Payer: Medicare Other

## 2022-04-12 ENCOUNTER — Other Ambulatory Visit: Payer: Self-pay

## 2022-04-12 DIAGNOSIS — E876 Hypokalemia: Secondary | ICD-10-CM | POA: Insufficient documentation

## 2022-04-12 DIAGNOSIS — I493 Ventricular premature depolarization: Secondary | ICD-10-CM | POA: Insufficient documentation

## 2022-04-12 DIAGNOSIS — E039 Hypothyroidism, unspecified: Secondary | ICD-10-CM | POA: Insufficient documentation

## 2022-04-12 DIAGNOSIS — Z7982 Long term (current) use of aspirin: Secondary | ICD-10-CM | POA: Diagnosis not present

## 2022-04-12 DIAGNOSIS — R001 Bradycardia, unspecified: Secondary | ICD-10-CM | POA: Diagnosis not present

## 2022-04-12 DIAGNOSIS — J449 Chronic obstructive pulmonary disease, unspecified: Secondary | ICD-10-CM | POA: Diagnosis not present

## 2022-04-12 DIAGNOSIS — R002 Palpitations: Secondary | ICD-10-CM | POA: Diagnosis present

## 2022-04-12 DIAGNOSIS — I498 Other specified cardiac arrhythmias: Secondary | ICD-10-CM

## 2022-04-12 LAB — BASIC METABOLIC PANEL
Anion gap: 8 (ref 5–15)
BUN: 12 mg/dL (ref 8–23)
CO2: 30 mmol/L (ref 22–32)
Calcium: 10.1 mg/dL (ref 8.9–10.3)
Chloride: 100 mmol/L (ref 98–111)
Creatinine, Ser: 0.65 mg/dL (ref 0.44–1.00)
GFR, Estimated: >60 mL/min (ref >60)
Glucose, Bld: 153 mg/dL — ABNORMAL HIGH (ref 70–99)
Potassium: 3.1 mmol/L — ABNORMAL LOW (ref 3.5–5.1)
Sodium: 138 mmol/L (ref 135–145)

## 2022-04-12 LAB — CBC
HCT: 45.5 % (ref 36.0–46.0)
Hemoglobin: 15.2 g/dL — ABNORMAL HIGH (ref 12.0–15.0)
MCH: 31.1 pg (ref 26.0–34.0)
MCHC: 33.4 g/dL (ref 30.0–36.0)
MCV: 93.2 fL (ref 80.0–100.0)
Platelets: 328 10*3/uL (ref 150–400)
RBC: 4.88 MIL/uL (ref 3.87–5.11)
RDW: 12.5 % (ref 11.5–15.5)
WBC: 6.1 10*3/uL (ref 4.0–10.5)
nRBC: 0 % (ref 0.0–0.2)

## 2022-04-12 LAB — TROPONIN I (HIGH SENSITIVITY): Troponin I (High Sensitivity): 4 ng/L (ref <18)

## 2022-04-12 LAB — MAGNESIUM: Magnesium: 2.1 mg/dL (ref 1.7–2.4)

## 2022-04-12 MED ORDER — POTASSIUM CHLORIDE 10 MEQ/100ML IV SOLN: 10.0000 meq | Freq: Once | INTRAVENOUS | Status: DC

## 2022-04-12 MED ORDER — POTASSIUM CHLORIDE ER 10 MEQ PO TBCR: 10.0000 meq | EXTENDED_RELEASE_TABLET | Freq: Every day | ORAL | 0 refills | Status: DC

## 2022-04-12 MED ORDER — POTASSIUM CHLORIDE 10 MEQ/100ML IV SOLN
10.0000 meq | INTRAVENOUS | Status: DC
  Administered 2022-04-12: 10 meq via INTRAVENOUS
  Filled 2022-04-12: qty 100

## 2022-04-12 MED ORDER — POTASSIUM CHLORIDE CRYS ER 20 MEQ PO TBCR
40.0000 meq | EXTENDED_RELEASE_TABLET | Freq: Once | ORAL | Status: AC
  Administered 2022-04-12: 40 meq via ORAL
  Filled 2022-04-12: qty 2

## 2022-04-12 NOTE — ED Provider Notes (Signed)
[facility] Provider Note   CSN: [id number] Arrival date & time: 04/12/22  1454     History {Add pertinent medical, surgical, social history, OB history to HPI:1} Chief Complaint  Patient presents with   Irregular Heart Beat    Tabitha Schmidt is a 66 y.o. female with history of coronary disease status post RCA stent, hypothyroidism, high cholesterol, presenting to ED with complaint of possible slow heart rate and palpitation.  Patient reports she has noted for the past 2 days she has frequent episodes where she feels that her heart is "plopping and skipping".  These episodes can last about a minute.  She said most recently she checked her heart rate with a pulse oximeter on her finger, during 1 of these episodes her heart rate dropped to 23 bpm.  She denies feeling lightheaded, dizzy or nauseated during this.  She does subsequently the heart rate climbed back to normal.  She has not had any adjustment to any of her medication including thyroid meds for several months.  She takes metoprolol 50 mg in the morning and 25 mg at night.  She drinks about a cup of coffee every morning, no other additional caffeine intake.  She denies history of A-fib or arrhythmia  HPI     Home Medications Prior to Admission medications   Medication Sig Start Date End Date Taking? Authorizing Provider  aspirin EC 81 MG EC tablet Take 1 tablet (81 mg total) by mouth daily. 05/15/19   Barton Dubois, MD  atorvastatin (LIPITOR) 80 MG tablet TAKE ONE TABLET BY MOUTH AT 6PM. 09/30/20   Arnoldo Lenis, MD  BREZTRI AEROSPHERE 160-9-4.8 MCG/ACT AERO INHALE (2) PUFFS TWICEODAILY FOR COPD RINSE MOUTH AFTER INHALATION. 03/19/20   [provider]  FLUoxetine (PROZAC) 20 MG capsule Take 20 mg by mouth daily. 01/25/21   [provider]  hydrochlorothiazide (HYDRODIURIL) 12.5 MG tablet Take 12.5 mg by mouth daily. 01/11/22   [provider]  LORazepam (ATIVAN) 0.5 MG tablet Take  0.5 mg by mouth 4 (four) times daily as needed. 06/29/20   [provider]  meclizine (ANTIVERT) 25 MG tablet Take 25 mg by mouth every 6 (six) hours. 02/17/20   [provider]  metoprolol tartrate (LOPRESSOR) 25 MG tablet Take 1 tablet (25 mg total) by mouth 3 (three) times daily. 02/11/22 05/12/22  Josue Hector, MD  NALTREXONE HCL PO Take 4.5 mg by mouth every morning.    [provider]  nitroGLYCERIN (NITROSTAT) 0.4 MG SL tablet Place 1 tablet (0.4 mg total) under the tongue every 5 (five) minutes as needed for chest pain. 02/10/21 02/11/22  Josue Hector, MD  PROAIR HFA 108 [phone] Base) MCG/ACT inhaler Inhale 1 puff into the lungs every 6 (six) hours as needed for wheezing or shortness of breath.  01/15/19   [provider]  RESTASIS 0.05 % ophthalmic emulsion 1 drop 2 (two) times daily. 01/27/21   [provider]  Suvorexant 5 MG TABS Take 5 mg by mouth daily.    [provider]  thyroid (ARMOUR) 90 MG tablet Take 90 mg by mouth daily.    [provider]      Allergies    Zegerid [omeprazole-sodium bicarbonate]    Review of Systems   Review of Systems  Physical Exam Updated Vital Signs BP (!) 177/87 (BP Location: Right Arm)   Pulse 85   Temp 98 F (36.7 C) (Oral)   Resp 16   Ht  5\' 7"  (1.702 m)   Wt 91.2 kg   SpO2 98%   BMI 31.48 kg/m  Physical Exam Vitals and nursing note reviewed.  Constitutional:      General: She is not in acute distress.    Appearance: She is well-developed.  HENT:     Head: Normocephalic and atraumatic.  Eyes:     Conjunctiva/sclera: Conjunctivae normal.  Cardiovascular:     Rate and Rhythm: Normal rate and regular rhythm.     Heart sounds: No murmur heard. Pulmonary:     Effort: Pulmonary effort is normal. No respiratory distress.     Breath sounds: Normal breath sounds.  Abdominal:     Palpations: Abdomen is soft.     Tenderness: There is no abdominal tenderness.  Musculoskeletal:         General: No swelling.     Cervical back: Neck supple.  Skin:    General: Skin is warm and dry.     Capillary Refill: Capillary refill takes less than 2 seconds.  Neurological:     Mental Status: She is alert.  Psychiatric:        Mood and Affect: Mood normal.     ED Results / Procedures / Treatments   Labs (all labs ordered are listed, but only abnormal results are displayed) Labs Reviewed  CBC - Abnormal; Notable for the following components:      Result Value   Hemoglobin 15.2 (*)    All other components within normal limits  BASIC METABOLIC PANEL    EKG None  Radiology DG Chest 2 View  Result Date: 04/12/2022 CLINICAL DATA:  Bradycardia EXAM: CHEST - 2 VIEW COMPARISON:  Previous studies including the chest radiograph done on 01/07/2022 and CT chest done on 07/15/2021 FINDINGS: Cardiac size is within normal limits. Increase in AP diameter of chest suggests COPD. Subtle increase in interstitial markings in the right upper and left lower lung fields have not changed suggesting scarring. No new focal infiltrates are seen. There is no pleural effusion or pneumothorax. IMPRESSION: COPD.  There are no new infiltrates or signs of pulmonary edema. Electronically Signed   By: Elmer Picker M.D.   On: 04/12/2022 15:34    Procedures Procedures  {Document cardiac monitor, telemetry assessment procedure when appropriate:1}  Medications Ordered in ED Medications - No data to display  ED Course/ Medical Decision Making/ A&P                           Medical Decision Making Amount and/or Complexity of Data Reviewed Labs: ordered. Radiology: ordered.   This patient presents to the ED with concern for palpitations and possible bradycardia. This involves an extensive number of treatment options, and is a complaint that carries with it a high risk of complications and morbidity.  The differential diagnosis includes heart block versus atypical ACS versus other  From her  presentation I suspect she may have PVCs or PACs which can be mildly symptomatic, but I doubt that her heart rate truly dropped into the 20s, as I would expect she would develop symptoms of lightheadedness or nausea if this were the case.  It is possible the pulse ox is not accurately measuring her heart rate.  We will watch her on telemetry in the ED and evaluate for any further arrhythmic episodes.  Co-morbidities that complicate the patient evaluation: Several cardiovascular risk factors  I ordered and personally interpreted labs.  The pertinent results include:  ***  She has no signs or symptoms of pneumothorax or pneumonia to warrant emergent imaging of her chest at this time  The patient was maintained on a cardiac monitor.  I personally viewed and interpreted the cardiac monitored which showed an underlying rhythm of: Sinus rhythm with occasional PVCs  Per my interpretation the patient's ECG shows normal sinus rhythm without evidence of acute ischemia or heart block  I have reviewed the patients home medicines and have made adjustments as needed  I requested consultation with the ***,  and discussed lab and imaging findings as well as pertinent plan - they recommend: ***  After the interventions noted above, I reevaluated the patient and found that they have: {resolved/improved/worsened:23923::"improved"}   Dispostion:  After consideration of the diagnostic results and the patients response to treatment, I feel that the patent would benefit from ***.   {Document critical care time when appropriate:1} {Document review of labs and clinical decision tools ie heart score, Chads2Vasc2 etc:1}  {Document your independent review of radiology images, and any outside records:1} {Document your discussion with family members, caretakers, and with consultants:1} {Document social determinants of health affecting pt's care:1} {Document your decision making why or why not admission, treatments  were needed:1} Final Clinical Impression(s) / ED Diagnoses Final diagnoses:  None    Rx / DC Orders ED Discharge Orders     None

## 2022-04-12 NOTE — ED Triage Notes (Signed)
Pt present with intermittent episodes of bradycardia capture by portable pulse ox, loses at 23 BPM.

## 2022-04-12 NOTE — Discharge Instructions (Addendum)
Please avoid any caffeinated beverages or products, energy drinks, alcohol, nicotine.  The stimulants can irritate your heart.  Please follow-up with your primary care provider or cardiologist this week to have your potassium level rechecked.  You should be following up with your cardiologist as soon as possible in the clinic for these irregular heartbeats.  You may benefit from wearing a cardiac monitor at home to monitor your heartbeats.

## 2022-04-13 NOTE — Progress Notes (Signed)
Cardiology Office Note    Date:  04/19/2022   ID:  Rumor, Sirman 31-Jan-1956, MRN [id number]   PCP:  Celene Squibb, MD   [place]  Cardiologist:  Jenkins Rouge, MD   Advanced Practice Provider:  No care team member to display Electrophysiologist:  None   [id number]   Chief Complaint  Patient presents with   Hospitalization Follow-up    History of Present Illness:  Tabitha Schmidt is a 66 y.o. female with history of CAD with cath 05/18/19 for NSTEMI showing occluded mid/distal RCA stented with good results and no disease in left system EF 50-55% Last carotid duplex 09/02/19 with patent right stent and 40-59% Left ICA stenosis, tobacco abuse, hypothyroidism.     Patient saw Dr. Johnsie Cancel 01/2022 for palpitations and metoprolol increased 25 mg tid. Labs including TSH, T3, T4 all normal.  Increased Palpitations in ED 04/13/22 and freq PVCs and 1 run trigeminy, no bradycardia. K was low 3.1 and given IV and oral.  Patient comes in for f/u.  Patient was taking mucinex but doesn't know if it had a decongestant in it. She stopped it. Yesterday while watching tv her heart rate jumped up to 104 at rest. She says she stays hydrated. Only 1 cup of coffee. She is now vaping.  No nicotine. No regular exercise. Concerned about palpitations. Also has some associated dizziness.  Past Medical History:  Diagnosis Date   Anxiety    Arthritis    Asthma    Cataracts, bilateral    Gershon Crane   Complication of anesthesia    Note: pt has hx of TMJ " every now and then my jaw will lock "   COPD (chronic obstructive pulmonary disease) (HCC)    Coronary artery disease    a. s/p DES to mid-RCA in 05/2019   DM (diabetes mellitus) ([place])    "came off medicines with dr's permission"   Family history of adverse reaction to anesthesia    " my son woke up crying "   Fatty liver    Fibromyalgia    GERD (gastroesophageal reflux disease)    Hyperlipidemia    stopped meds on her own    Hypothyroid    Myocardial infarction ([place])    On supplemental oxygen therapy    at night and PRN   Pneumonia    Shortness of breath    Stenosis of right carotid artery    TMJ (dislocation of temporomandibular joint)     Past Surgical History:  Procedure Laterality Date   ABDOMINAL HYSTERECTOMY     BREAST SURGERY     removal of bilateral benign tumors   CARDIAC CATHETERIZATION     2010   CHOLECYSTECTOMY     CORONARY STENT INTERVENTION N/A 05/18/2019   Procedure: CORONARY STENT INTERVENTION;  Surgeon: Lorretta Harp, MD;  Location: [facility];  Service: Cardiovascular;  Laterality: N/A;   DILATION AND CURETTAGE OF UTERUS     ESOPHAGOGASTRODUODENOSCOPY  01/26/2012   Procedure: ESOPHAGOGASTRODUODENOSCOPY (EGD);  Surgeon: Rogene Houston, MD;  [facility];  Service: Endoscopy;  Laterality: N/A;  730   LEFT HEART CATH AND CORONARY ANGIOGRAPHY N/A 05/18/2019   Procedure: LEFT HEART CATH AND CORONARY ANGIOGRAPHY;  Surgeon: Lorretta Harp, MD;  Location: [facility];  Service: Cardiovascular;  Laterality: N/A;   TRANSCAROTID ARTERY REVASCULARIZATION  Right 08/02/2019   Procedure: TRANSCAROTID ARTERY REVASCULARIZATION RIGHT;  Surgeon: Serafina Mitchell, MD;  Location:  [facility];  Service: Vascular;  Laterality: Right;   TUBAL LIGATION      Current Medications: Current Meds  Medication Sig   aspirin EC 81 MG EC tablet Take 1 tablet (81 mg total) by mouth daily.   atorvastatin (LIPITOR) 80 MG tablet TAKE ONE TABLET BY MOUTH AT 6PM. (Patient taking differently: Take 80 mg by mouth in the morning.)   BREZTRI AEROSPHERE 160-9-4.8 MCG/ACT AERO Inhale 2 puffs into the lungs daily.   hydrochlorothiazide (HYDRODIURIL) 12.5 MG tablet Take 12.5 mg by mouth daily.   LORazepam (ATIVAN) 0.5 MG tablet Take 0.5 mg by mouth 4 (four) times daily as needed.   meclizine (ANTIVERT) 25 MG tablet Take 25 mg by mouth every 6 (six) hours.   metoprolol tartrate (LOPRESSOR) 25 MG tablet  Take 1 tablet (25 mg total) by mouth 3 (three) times daily. (Patient taking differently: Take 25 mg by mouth See admin instructions. Take 50 mg in the morning and 25 mg every evening)   NALTREXONE HCL PO Take 4.5 mg by mouth every morning.   potassium chloride (KLOR-CON) 10 MEQ tablet Take 1 tablet (10 mEq total) by mouth daily for 15 days.   PROAIR HFA 108 (90 Base) MCG/ACT inhaler Inhale 1 puff into the lungs every 6 (six) hours as needed for wheezing or shortness of breath.    RESTASIS 0.05 % ophthalmic emulsion 1 drop 2 (two) times daily.   thyroid (ARMOUR) 90 MG tablet Take 90 mg by mouth daily.     Allergies:   Zegerid [omeprazole-sodium bicarbonate]   Social History   Socioeconomic History   Marital status: Single    Spouse name: Not on file   Number of children: Not on file   Years of education: Not on file   Highest education level: Not on file  Occupational History   Not on file  Tobacco Use   Smoking status: Former    Packs/day: 0.50    Years: 40.00    Total pack years: 20.00    Types: Cigarettes, E-cigarettes    Start date: 12/2021    Quit date: 12/2021    Years since quitting: 0.3   Smokeless tobacco: Never  Vaping Use   Vaping Use: Never used  Substance and Sexual Activity   Alcohol use: No   Drug use: No    Types: Cocaine   Sexual activity: Yes    Birth control/protection: Surgical  Other Topics Concern   Not on file  Social History Narrative   Not on file   Social Determinants of Health   Financial Resource Strain: Not on file  Food Insecurity: Not on file  Transportation Needs: Not on file  Physical Activity: Not on file  Stress: Not on file  Social Connections: Not on file     Family History:  The patient's  family history includes CAD in her father.   ROS:   Please see the history of present illness.    ROS All other systems reviewed and are negative.   PHYSICAL EXAM:   VS:  BP (!) 142/62 (BP Location: Left Arm, Patient Position:  Sitting, Cuff Size: Normal)   Pulse 92   Ht 5\' 6"  (1.676 m)   Wt 201 lb (91.2 kg)   SpO2 95%   BMI 32.44 kg/m   Physical Exam  GEN: Well nourished, well developed, in no acute distress  Neck: bilateral carotid bruits,no JVD, or masses Cardiac:RRR; no murmurs, rubs, or gallops  Respiratory:  decreased breath sounds but clear  to auscultation bilaterally, normal work of breathing GI: soft, nontender, nondistended, + BS Ext: without cyanosis, clubbing, or edema, Good distal pulses bilaterally Neuro:  Alert and Oriented x 3,  Psych: euthymic mood, full affect  Wt Readings from Last 3 Encounters:  04/19/22 201 lb (91.2 kg)  04/12/22 201 lb (91.2 kg)  02/11/22 203 lb (92.1 kg)      Studies/Labs Reviewed:   EKG:  EKG is not ordered today.    Recent Labs: 01/07/2022: B Natriuretic Peptide 124.0 04/12/2022: BUN 12; Creatinine, Ser 0.65; Hemoglobin 15.2; Magnesium 2.1; Platelets 328; Potassium 3.1; Sodium 138   Lipid Panel    Component Value Date/Time   CHOL 181 05/14/2019 0129   TRIG 167 (H) 05/14/2019 0129   HDL 35 (L) 05/14/2019 0129   CHOLHDL 5.2 05/14/2019 0129   VLDL 33 05/14/2019 0129   LDLCALC 113 (H) 05/14/2019 0129    Additional studies/ records that were reviewed today include:   Carotid US 02/23/22  IMPRESSION: Right:   Established duplex criteria have not been validated in the setting of prior carotid stenting, however, there is patent right carotid stent with no definite evidence of recurrent stenosis.   Left:   Moderate homogeneous plaque at the left carotid bifurcation, with discordant results regarding degree of stenosis by established duplex criteria. Peak velocity suggests 50%-69% stenosis, with the ICA/ CCA ratio suggesting a lesser degree of stenosis. If establishing a more accurate degree of stenosis is required, cerebral angiogram should be considered, or as a second best test, CTA.   Signed,   Yvone Neu. Reyne Dumas, RPVI   [facility]   [facility]    Risk Assessment/Calculations:         ASSESSMENT:    1. Palpitations   2. Coronary artery disease of native artery of native heart with stable angina pectoris (HCC)   3. Hyperlipidemia LDL goal <70   4. History of right common carotid artery stent placement   5. Other specified hypothyroidism   6. Tobacco abuse      PLAN:  In order of problems listed above:  Palpitations in ED 04/13/22 and freq PVCs and 1 run trigeminy, no bradycardia. K was low 3.1 and given IV and oral. Still having some palpitations. Will recheck K today and check holter monitor.   CAD with cath 05/18/19 for NSTEMI showing occluded mid/distal RCA stented with good results and no disease in left system EF 50-55%-no angina  HLD-LDL 72 in April-goal close to goal.  PVD stenting right ICA with residual 50-69% LICA 02/23/22 followed by Dr. Myra Gianotti  Hypothyroidism-thyroid studies normal in April  Tobacco abuse-now vaping without nicotine but daughter smokes in house.  Shared Decision Making/Informed Consent        Medication Adjustments/Labs and Tests Ordered: Current medicines are reviewed at length with the patient today.  Concerns regarding medicines are outlined above.  Medication changes, Labs and Tests ordered today are listed in the Patient Instructions below. Patient Instructions  Medication Instructions:  Your physician recommends that you continue on your current medications as directed. Please refer to the Current Medication list given to you today.   Labwork: Today: BMET  Testing/Procedures: None  Follow-Up: Follow up with Dr. Eden Emms in 4-6 months.   Any Other Special Instructions Will Be Listed Below (If Applicable).    Managing the Challenge of Quitting Smoking Quitting smoking is a physical and mental challenge. You may have cravings, withdrawal symptoms, and temptation to smoke. Before quitting, work  with your health  care provider to make a plan that can help you manage quitting. Making a plan before you quit may keep you from smoking when you have the urge to smoke while trying to quit. How to manage lifestyle changes Managing stress Stress can make you want to smoke, and wanting to smoke may cause stress. It is important to find ways to manage your stress. You could try some of the following: Practice relaxation techniques. Breathe slowly and deeply, in through your nose and out through your mouth. Listen to music. Soak in a bath or take a shower. Imagine a peaceful place or vacation. Get some support. Talk with family or friends about your stress. Join a support group. Talk with a counselor or therapist. Get some physical activity. Go for a walk, run, or bike ride. Play a favorite sport. Practice yoga.  Medicines Talk with your health care provider about medicines that might help you deal with cravings and make quitting easier for you. Relationships Social situations can be difficult when you are quitting smoking. To manage this, you can: Avoid parties and other social situations where people might be smoking. Avoid alcohol. Leave right away if you have the urge to smoke. Explain to your family and friends that you are quitting smoking. Ask for support and let them know you might be a bit grumpy. Plan activities where smoking is not an option. General instructions Be aware that many people gain weight after they quit smoking. However, not everyone does. To keep from gaining weight, have a plan in place before you quit, and stick to the plan after you quit. Your plan should include: Eating healthy snacks. When you have a craving, it may help to: Eat popcorn, or try carrots, celery, or other cut vegetables. Chew sugar-free gum. Changing how you eat. Eat small portion sizes at meals. Eat 4-6 small meals throughout the day instead of 1-2 large meals a day. Be mindful when you eat. You should  avoid watching television or doing other things that might distract you as you eat. Exercising regularly. Make time to exercise each day. If you do not have time for a long workout, do short bouts of exercise for 5-10 minutes several times a day. Do some form of strengthening exercise, such as weight lifting. Do some exercise that gets your heart beating and causes you to breathe deeply, such as walking fast, running, swimming, or biking. This is very important. Drinking plenty of water or other low-calorie or no-calorie drinks. Drink enough fluid to keep your urine pale yellow.  How to recognize withdrawal symptoms Your body and mind may experience discomfort as you try to get used to not having nicotine in your system. These effects are called withdrawal symptoms. They may include: Feeling hungrier than normal. Having trouble concentrating. Feeling irritable or restless. Having trouble sleeping. Feeling depressed. Craving a cigarette. These symptoms may surprise you, but they are normal to have when quitting smoking. To manage withdrawal symptoms: Avoid places, people, and activities that trigger your cravings. Remember why you want to quit. Get plenty of sleep. Avoid coffee and other drinks that contain caffeine. These may worsen some of your symptoms. How to manage cravings Come up with a plan for how to deal with your cravings. The plan should include the following: A definition of the specific situation you want to deal with. An activity or action you will take to replace smoking. A clear idea for how this action will help. The name  of someone who could help you with this. Cravings usually last for 5-10 minutes. Consider taking the following actions to help you with your plan to deal with cravings: Keep your mouth busy. Chew sugar-free gum. Suck on hard candies or a straw. Brush your teeth. Keep your hands and body busy. Change to a different activity right away. Squeeze or  play with a ball. Do an activity or a hobby, such as making bead jewelry, practicing needlepoint, or working with wood. Mix up your normal routine. Take a short exercise break. Go for a quick walk, or run up and down stairs. Focus on doing something kind or helpful for someone else. Call a friend or family member to talk during a craving. Join a support group. Contact a quitline. Where to find support To get help or find a support group: Call the [employer] [facility]'s Smoking Quitline: 1-800-QUIT-NOW (971)043-2340) Text QUIT to SmokefreeTXTAZ:4618977 Where to find more information Visit these websites to find more information on quitting smoking: U.S. Department of Health and Human Services: www.smokefree.gov American Lung Association: www.freedomfromsmoking.org Centers for Disease Control and Prevention (CDC): http://www.wolf.info/ American Heart Association: www.heart.org Contact a health care provider if: You want to change your plan for quitting. The medicines you are taking are not helping. Your eating feels out of control or you cannot sleep. You feel depressed or become very anxious. Summary Quitting smoking is a physical and mental challenge. You will face cravings, withdrawal symptoms, and temptation to smoke again. Preparation can help you as you go through these challenges. Try different techniques to manage stress, handle social situations, and prevent weight gain. You can deal with cravings by keeping your mouth busy (such as by chewing gum), keeping your hands and body busy, calling family or friends, or contacting a quitline for people who want to quit smoking. You can deal with withdrawal symptoms by avoiding places where people smoke, getting plenty of rest, and avoiding drinks that contain caffeine. This information is not intended to replace advice given to you by your health care provider. Make sure you discuss any questions you have with your health care provider. Document  Revised: 10/08/2021 Document Reviewed: 10/08/2021 Elsevier Patient Education  [place].    If you need a refill on your cardiac medications before your next appointment, please call your pharmacy.  ZIO XT- Long Term Monitor Instructions   Your physician has requested you wear your ZIO patch monitor___14____days.   This is a single patch monitor.  [employer] supplies one patch monitor per enrollment.  Additional stickers are not available.   Please do not apply patch if you will be having a Nuclear Stress Test, Echocardiogram, Cardiac CT, MRI, or Chest Xray during the time frame you would be wearing the monitor. The patch cannot be worn during these tests.  You cannot remove and re-apply the ZIO XT patch monitor.   Your ZIO patch monitor will be sent USPS Priority mail from [facility] directly to your home address. The monitor may also be mailed to a PO BOX if home delivery is not available.   It may take 3-5 days to receive your monitor after you have been enrolled.   Once you have received you monitor, please review enclosed instructions.  Your monitor has already been registered assigning a specific monitor serial # to you.   Applying the monitor   Shave hair from upper left chest.   Hold abrader disc by orange tab.  Rub abrader in 40 strokes over  left upper chest as indicated in your monitor instructions.   Clean area with 4 enclosed alcohol pads .  Use all pads to assure are is cleaned thoroughly.  Let dry.   Apply patch as indicated in monitor instructions.  Patch will be place under collarbone on left side of chest with arrow pointing upward.   Rub patch adhesive wings for 2 minutes.Remove white label marked "1".  Remove white label marked "2".  Rub patch adhesive wings for 2 additional minutes.   While looking in a mirror, press and release button in center of patch.  A small green light will flash 3-4 times .  This will be your only indicator the monitor has  been turned on.     Do not shower for the first 24 hours.  You may shower after the first 24 hours.   Press button if you feel a symptom. You will hear a small click.  Record Date, Time and Symptom in the Patient Log Book.   When you are ready to remove patch, follow instructions on last 2 pages of Patient Log Book.  Stick patch monitor onto last page of Patient Log Book.   Place Patient Log Book in [place] box.  Use locking tab on box and tape box closed securely.  The [employer] has [employer] on it.  Please place in mailbox as soon as possible.  Your physician should have your test results approximately 7 days after the monitor has been mailed back to [facility].   Call [facility] at [phone] if you have questions regarding your ZIO XT patch monitor.  Call them immediately if you see an orange light blinking on your monitor.   If your monitor falls off in less than 4 days contact our Monitor department at [phone].  If your monitor becomes loose or falls off after 4 days call [employer] at [phone] for suggestions on securing your monitor.     Signed, Ermalinda Barrios, PA-C  04/19/2022 9:20 AM    [facility] [place], [place], [place]  [address] Phone: [phone]; Fax: [phone]

## 2022-04-15 DIAGNOSIS — J449 Chronic obstructive pulmonary disease, unspecified: Secondary | ICD-10-CM | POA: Diagnosis not present

## 2022-04-19 ENCOUNTER — Other Ambulatory Visit (HOSPITAL_COMMUNITY)
Admission: RE | Admit: 2022-04-19 | Discharge: 2022-04-19 | Disposition: A | Discharge status: Home / Self Care | Payer: Medicare Other | Source: Ambulatory Visit | Attending: Physician Assistant | Admitting: Physician Assistant

## 2022-04-19 ENCOUNTER — Ambulatory Visit (INDEPENDENT_AMBULATORY_CARE_PROVIDER_SITE_OTHER): Payer: Medicare Other | Admitting: Physician Assistant

## 2022-04-19 ENCOUNTER — Other Ambulatory Visit: Payer: Self-pay | Admitting: Physician Assistant

## 2022-04-19 ENCOUNTER — Ambulatory Visit (INDEPENDENT_AMBULATORY_CARE_PROVIDER_SITE_OTHER): Payer: Medicare Other

## 2022-04-19 ENCOUNTER — Encounter: Payer: Self-pay | Admitting: Physician Assistant

## 2022-04-19 VITALS — BP 142/62 | HR 92 | Ht 66.0 in | Wt 201.0 lb

## 2022-04-19 DIAGNOSIS — E785 Hyperlipidemia, unspecified: Secondary | ICD-10-CM

## 2022-04-19 DIAGNOSIS — E038 Other specified hypothyroidism: Secondary | ICD-10-CM

## 2022-04-19 DIAGNOSIS — Z9889 Other specified postprocedural states: Secondary | ICD-10-CM | POA: Insufficient documentation

## 2022-04-19 DIAGNOSIS — Z720 Tobacco use: Secondary | ICD-10-CM

## 2022-04-19 DIAGNOSIS — R002 Palpitations: Secondary | ICD-10-CM

## 2022-04-19 DIAGNOSIS — Z95828 Presence of other vascular implants and grafts: Secondary | ICD-10-CM | POA: Insufficient documentation

## 2022-04-19 DIAGNOSIS — I25118 Atherosclerotic heart disease of native coronary artery with other forms of angina pectoris: Secondary | ICD-10-CM | POA: Insufficient documentation

## 2022-04-19 LAB — BASIC METABOLIC PANEL
Anion gap: 10 (ref 5–15)
BUN: 12 mg/dL (ref 8–23)
CO2: 31 mmol/L (ref 22–32)
Calcium: 9.8 mg/dL (ref 8.9–10.3)
Chloride: 99 mmol/L (ref 98–111)
Creatinine, Ser: 0.73 mg/dL (ref 0.44–1.00)
GFR, Estimated: >60 mL/min (ref >60)
Glucose, Bld: 123 mg/dL — ABNORMAL HIGH (ref 70–99)
Potassium: 3.8 mmol/L (ref 3.5–5.1)
Sodium: 140 mmol/L (ref 135–145)

## 2022-04-19 NOTE — Patient Instructions (Addendum)
Medication Instructions:  Your physician recommends that you continue on your current medications as directed. Please refer to the Current Medication list given to you today.   Labwork: Today: BMET  Testing/Procedures: None  Follow-Up: Follow up with Dr. Eden Emms in 4-6 months.   Any Other Special Instructions Will Be Listed Below (If Applicable).    Managing the Challenge of Quitting Smoking Quitting smoking is a physical and mental challenge. You may have cravings, withdrawal symptoms, and temptation to smoke. Before quitting, work with your health care provider to make a plan that can help you manage quitting. Making a plan before you quit may keep you from smoking when you have the urge to smoke while trying to quit. How to manage lifestyle changes Managing stress Stress can make you want to smoke, and wanting to smoke may cause stress. It is important to find ways to manage your stress. You could try some of the following: Practice relaxation techniques. Breathe slowly and deeply, in through your nose and out through your mouth. Listen to music. Soak in a bath or take a shower. Imagine a peaceful place or vacation. Get some support. Talk with family or friends about your stress. Join a support group. Talk with a counselor or therapist. Get some physical activity. Go for a walk, run, or bike ride. Play a favorite sport. Practice yoga.  Medicines Talk with your health care provider about medicines that might help you deal with cravings and make quitting easier for you. Relationships Social situations can be difficult when you are quitting smoking. To manage this, you can: Avoid parties and other social situations where people might be smoking. Avoid alcohol. Leave right away if you have the urge to smoke. Explain to your family and friends that you are quitting smoking. Ask for support and let them know you might be a bit grumpy. Plan activities where smoking is not an  option. General instructions Be aware that many people gain weight after they quit smoking. However, not everyone does. To keep from gaining weight, have a plan in place before you quit, and stick to the plan after you quit. Your plan should include: Eating healthy snacks. When you have a craving, it may help to: Eat popcorn, or try carrots, celery, or other cut vegetables. Chew sugar-free gum. Changing how you eat. Eat small portion sizes at meals. Eat 4-6 small meals throughout the day instead of 1-2 large meals a day. Be mindful when you eat. You should avoid watching television or doing other things that might distract you as you eat. Exercising regularly. Make time to exercise each day. If you do not have time for a long workout, do short bouts of exercise for 5-10 minutes several times a day. Do some form of strengthening exercise, such as weight lifting. Do some exercise that gets your heart beating and causes you to breathe deeply, such as walking fast, running, swimming, or biking. This is very important. Drinking plenty of water or other low-calorie or no-calorie drinks. Drink enough fluid to keep your urine pale yellow.  How to recognize withdrawal symptoms Your body and mind may experience discomfort as you try to get used to not having nicotine in your system. These effects are called withdrawal symptoms. They may include: Feeling hungrier than normal. Having trouble concentrating. Feeling irritable or restless. Having trouble sleeping. Feeling depressed. Craving a cigarette. These symptoms may surprise you, but they are normal to have when quitting smoking. To manage withdrawal symptoms: Avoid places, people,  and activities that trigger your cravings. Remember why you want to quit. Get plenty of sleep. Avoid coffee and other drinks that contain caffeine. These may worsen some of your symptoms. How to manage cravings Come up with a plan for how to deal with your cravings.  The plan should include the following: A definition of the specific situation you want to deal with. An activity or action you will take to replace smoking. A clear idea for how this action will help. The name of someone who could help you with this. Cravings usually last for 5-10 minutes. Consider taking the following actions to help you with your plan to deal with cravings: Keep your mouth busy. Chew sugar-free gum. Suck on hard candies or a straw. Brush your teeth. Keep your hands and body busy. Change to a different activity right away. Squeeze or play with a ball. Do an activity or a hobby, such as making bead jewelry, practicing needlepoint, or working with wood. Mix up your normal routine. Take a short exercise break. Go for a quick walk, or run up and down stairs. Focus on doing something kind or helpful for someone else. Call a friend or family member to talk during a craving. Join a support group. Contact a quitline. Where to find support To get help or find a support group: Call the [employer]'s Smoking Quitline: 1-800-QUIT-NOW (323)682-3307) Text QUIT to SmokefreeTXTMQ:317211 Where to find more information Visit these websites to find more information on quitting smoking: U.S. Department of Health and Human Services: www.smokefree.gov American Lung Association: www.freedomfromsmoking.org Centers for Disease Control and Prevention (CDC): http://www.wolf.info/ American Heart Association: www.heart.org Contact a health care provider if: You want to change your plan for quitting. The medicines you are taking are not helping. Your eating feels out of control or you cannot sleep. You feel depressed or become very anxious. Summary Quitting smoking is a physical and mental challenge. You will face cravings, withdrawal symptoms, and temptation to smoke again. Preparation can help you as you go through these challenges. Try different techniques to manage stress, handle social  situations, and prevent weight gain. You can deal with cravings by keeping your mouth busy (such as by chewing gum), keeping your hands and body busy, calling family or friends, or contacting a quitline for people who want to quit smoking. You can deal with withdrawal symptoms by avoiding places where people smoke, getting plenty of rest, and avoiding drinks that contain caffeine. This information is not intended to replace advice given to you by your health care provider. Make sure you discuss any questions you have with your health care provider. Document Revised: 10/08/2021 Document Reviewed: 10/08/2021 Elsevier Patient Education  [place].    If you need a refill on your cardiac medications before your next appointment, please call your pharmacy.  ZIO XT- Long Term Monitor Instructions   Your physician has requested you wear your ZIO patch monitor___14____days.   This is a single patch monitor.  [employer] supplies one patch monitor per enrollment.  Additional stickers are not available.   Please do not apply patch if you will be having a Nuclear Stress Test, Echocardiogram, Cardiac CT, MRI, or Chest Xray during the time frame you would be wearing the monitor. The patch cannot be worn during these tests.  You cannot remove and re-apply the ZIO XT patch monitor.   Your ZIO patch monitor will be sent USPS Priority mail from [facility] directly to your home address. The monitor  may also be mailed to a PO BOX if home delivery is not available.   It may take 3-5 days to receive your monitor after you have been enrolled.   Once you have received you monitor, please review enclosed instructions.  Your monitor has already been registered assigning a specific monitor serial # to you.   Applying the monitor   Shave hair from upper left chest.   Hold abrader disc by orange tab.  Rub abrader in 40 strokes over left upper chest as indicated in your monitor instructions.   Clean  area with 4 enclosed alcohol pads .  Use all pads to assure are is cleaned thoroughly.  Let dry.   Apply patch as indicated in monitor instructions.  Patch will be place under collarbone on left side of chest with arrow pointing upward.   Rub patch adhesive wings for 2 minutes.Remove white label marked "1".  Remove white label marked "2".  Rub patch adhesive wings for 2 additional minutes.   While looking in a mirror, press and release button in center of patch.  A small green light will flash 3-4 times .  This will be your only indicator the monitor has been turned on.     Do not shower for the first 24 hours.  You may shower after the first 24 hours.   Press button if you feel a symptom. You will hear a small click.  Record Date, Time and Symptom in the Patient Log Book.   When you are ready to remove patch, follow instructions on last 2 pages of Patient Log Book.  Stick patch monitor onto last page of Patient Log Book.   Place Patient Log Book in [place] box.  Use locking tab on box and tape box closed securely.  The [employer] has [employer] on it.  Please place in mailbox as soon as possible.  Your physician should have your test results approximately 7 days after the monitor has been mailed back to [facility].   Call [facility] Customer Care at [phone] if you have questions regarding your ZIO XT patch monitor.  Call them immediately if you see an orange light blinking on your monitor.   If your monitor falls off in less than 4 days contact our Monitor department at [phone].  If your monitor becomes loose or falls off after 4 days call [employer] at [phone] for suggestions on securing your monitor.

## 2022-04-21 ENCOUNTER — Telehealth: Payer: Self-pay | Admitting: *Deleted

## 2022-04-21 MED ORDER — POTASSIUM CHLORIDE ER 10 MEQ PO TBCR: 10.0000 meq | EXTENDED_RELEASE_TABLET | Freq: Every day | ORAL | 3 refills | Status: DC

## 2022-04-21 NOTE — Telephone Encounter (Signed)
Pt notified and refill order placed.

## 2022-04-21 NOTE — Telephone Encounter (Signed)
-----   Message from Dyann Kief, [place] sent at 04/19/2022 12:31 PM EDT ----- K normal. Would continue K 10 meq once daily-she'll need a refill. No other changes thanks

## 2022-04-22 DIAGNOSIS — J449 Chronic obstructive pulmonary disease, unspecified: Secondary | ICD-10-CM | POA: Diagnosis not present

## 2022-05-10 DIAGNOSIS — R002 Palpitations: Secondary | ICD-10-CM | POA: Diagnosis not present

## 2022-05-15 DIAGNOSIS — J449 Chronic obstructive pulmonary disease, unspecified: Secondary | ICD-10-CM | POA: Diagnosis not present

## 2022-05-17 ENCOUNTER — Telehealth: Payer: Self-pay

## 2022-05-17 IMAGING — DX DG CHEST 1V PORT
1 series · 1 of 1 positions shown · non-contrast
Comparison: Chest CT 07/15/2021.

CLINICAL DATA: Dyspnea with recent positive COVID test.

EXAM:
PORTABLE CHEST 1 VIEW

[chest ap]
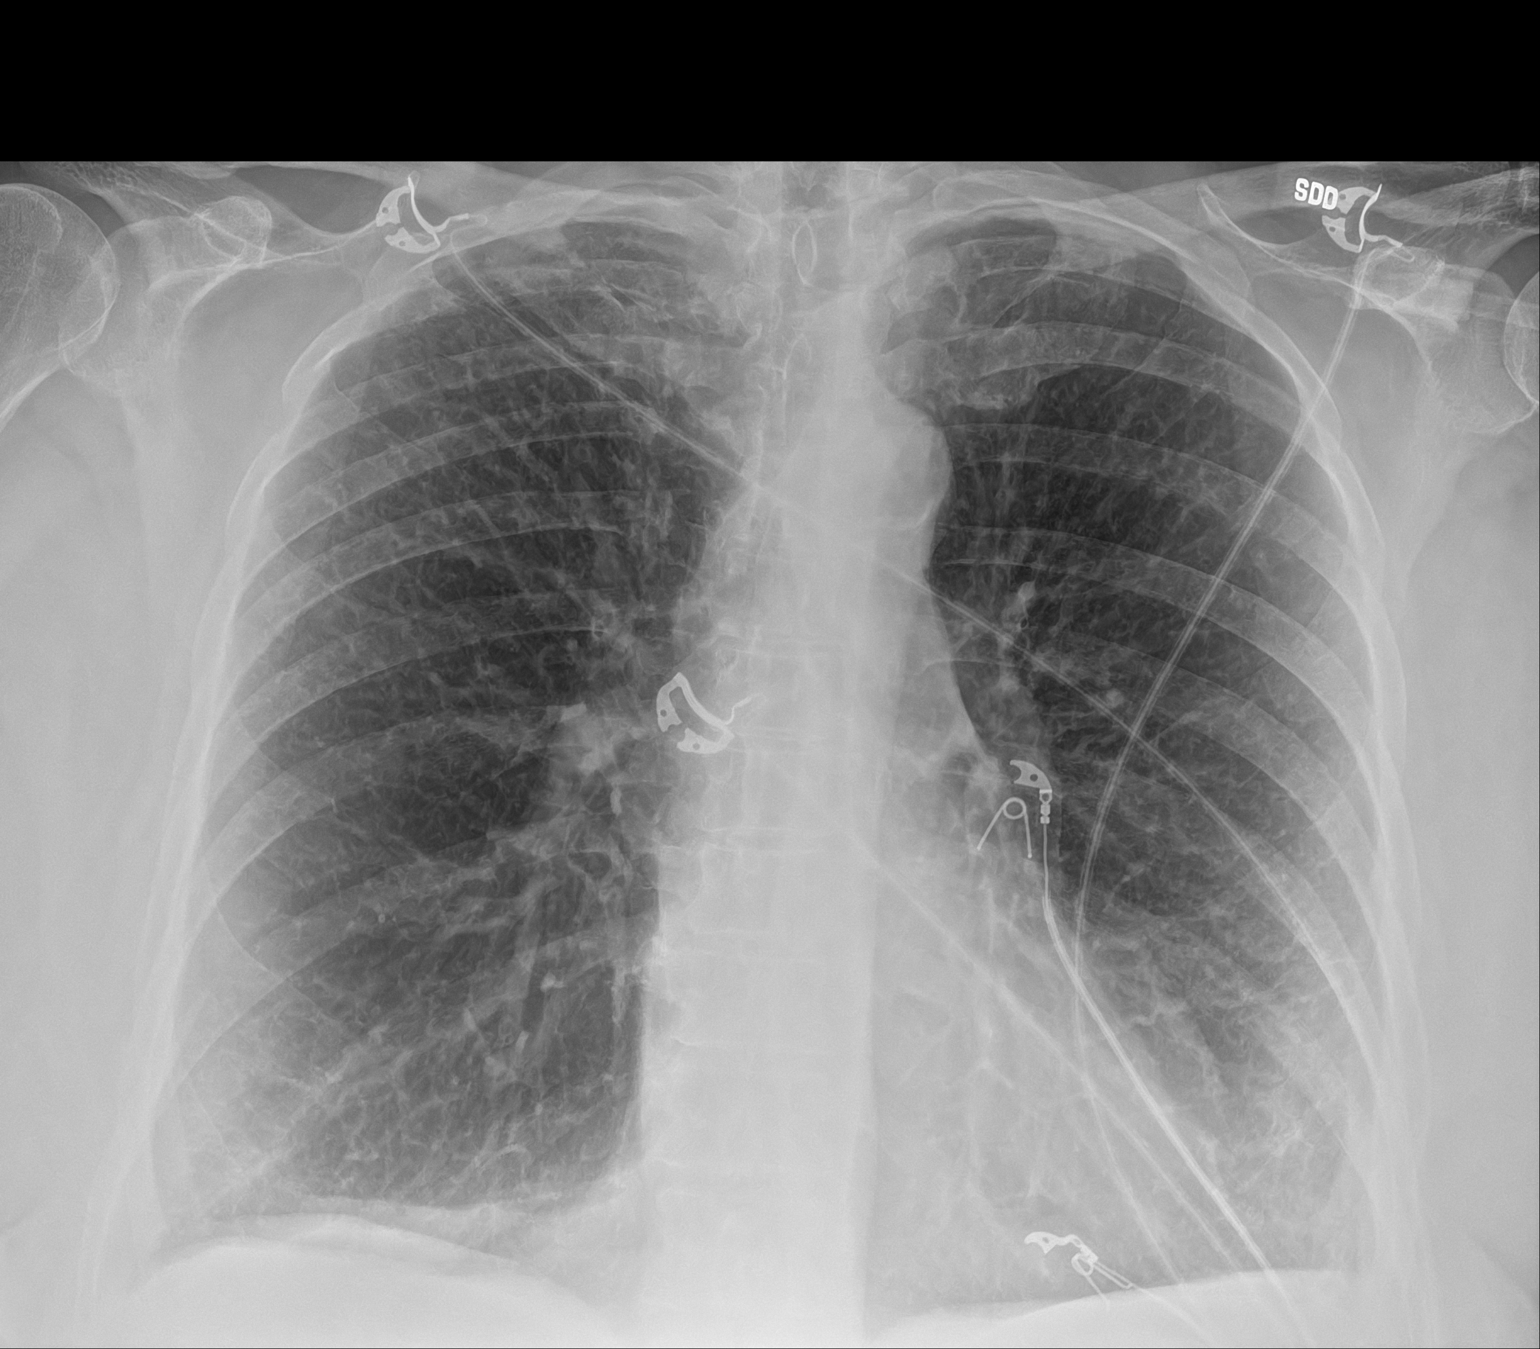

[1 of 1 positions shown; findings below may reference images not displayed]

FINDINGS: Cardiac size is normal. There is a normal mediastinal configuration
with atherosclerosis of the arch. Lungs are emphysematous with mild
subpleural reticulation in the lower lung zones again noted
consistent with chronic change. No pleural effusion is seen. There
is thoracic spondylosis.
IMPRESSION: No evidence of acute chest disease. Stable COPD chest chronic
interstitial change.

## 2022-05-17 MED ORDER — METOPROLOL TARTRATE 50 MG PO TABS: 50.0000 mg | ORAL_TABLET | Freq: Two times a day (BID) | ORAL | 3 refills | Status: DC

## 2022-05-17 NOTE — Telephone Encounter (Signed)
Patient notified and verbalized understanding understanding of results. Patient had no questions or concerns at this time. PCP copied

## 2022-05-17 NOTE — Telephone Encounter (Signed)
-----   Message from Beatrice Lecher, [place] sent at 05/12/2022  5:11 PM EDT ----- Results reviewed for Jacolyn Reedy, PA-C  Monitor does not show sustained abnormal rhythms. There were rare, brief supraventricular runs. We can increase her beta-blocker for symptom control.  PLAN:  -Increase Metoprolol Tartrate to 50 mg twice daily  Tereso Newcomer, PA-C    05/12/2022 5:08 PM

## 2022-05-19 DIAGNOSIS — E039 Hypothyroidism, unspecified: Secondary | ICD-10-CM | POA: Diagnosis not present

## 2022-05-19 DIAGNOSIS — E538 Deficiency of other specified B group vitamins: Secondary | ICD-10-CM | POA: Diagnosis not present

## 2022-05-19 DIAGNOSIS — E782 Mixed hyperlipidemia: Secondary | ICD-10-CM | POA: Diagnosis not present

## 2022-05-19 DIAGNOSIS — R7303 Prediabetes: Secondary | ICD-10-CM | POA: Diagnosis not present

## 2022-05-19 DIAGNOSIS — I10 Essential (primary) hypertension: Secondary | ICD-10-CM | POA: Diagnosis not present

## 2022-05-19 DIAGNOSIS — E559 Vitamin D deficiency, unspecified: Secondary | ICD-10-CM | POA: Diagnosis not present

## 2022-05-22 DIAGNOSIS — J449 Chronic obstructive pulmonary disease, unspecified: Secondary | ICD-10-CM | POA: Diagnosis not present

## 2022-05-25 DIAGNOSIS — E039 Hypothyroidism, unspecified: Secondary | ICD-10-CM | POA: Diagnosis not present

## 2022-05-25 DIAGNOSIS — I2510 Atherosclerotic heart disease of native coronary artery without angina pectoris: Secondary | ICD-10-CM | POA: Diagnosis not present

## 2022-05-25 DIAGNOSIS — J449 Chronic obstructive pulmonary disease, unspecified: Secondary | ICD-10-CM | POA: Diagnosis not present

## 2022-05-25 DIAGNOSIS — E559 Vitamin D deficiency, unspecified: Secondary | ICD-10-CM | POA: Diagnosis not present

## 2022-05-25 DIAGNOSIS — E538 Deficiency of other specified B group vitamins: Secondary | ICD-10-CM | POA: Diagnosis not present

## 2022-05-25 DIAGNOSIS — E782 Mixed hyperlipidemia: Secondary | ICD-10-CM | POA: Diagnosis not present

## 2022-05-25 DIAGNOSIS — R232 Flushing: Secondary | ICD-10-CM | POA: Diagnosis not present

## 2022-05-25 DIAGNOSIS — M1990 Unspecified osteoarthritis, unspecified site: Secondary | ICD-10-CM | POA: Diagnosis not present

## 2022-05-25 DIAGNOSIS — I10 Essential (primary) hypertension: Secondary | ICD-10-CM | POA: Diagnosis not present

## 2022-05-25 DIAGNOSIS — F17210 Nicotine dependence, cigarettes, uncomplicated: Secondary | ICD-10-CM | POA: Diagnosis not present

## 2022-06-15 DIAGNOSIS — J449 Chronic obstructive pulmonary disease, unspecified: Secondary | ICD-10-CM | POA: Diagnosis not present

## 2022-06-22 DIAGNOSIS — J449 Chronic obstructive pulmonary disease, unspecified: Secondary | ICD-10-CM | POA: Diagnosis not present

## 2022-07-03 IMAGING — US US CAROTID DUPLEX BILAT
1 series · 13 of 24 positions shown · non-contrast
Comparison: CT 07/25/2019

CLINICAL DATA: 66-year-old female with a history of bruit. Reported
history of stent/carotid endarterectomy

EXAM:
BILATERAL CAROTID DUPLEX ULTRASOUND
TECHNIQUE: Gray scale imaging, color Doppler and duplex ultrasound were
performed of bilateral carotid and vertebral arteries in the neck.

[Series 1: us carotid bilateral · 13 of 68 slices shown]
[im 1/68]
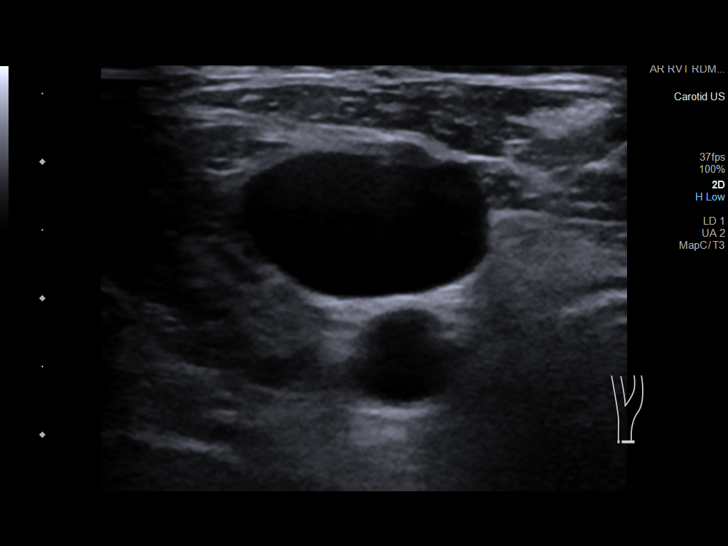
[im 6/68]
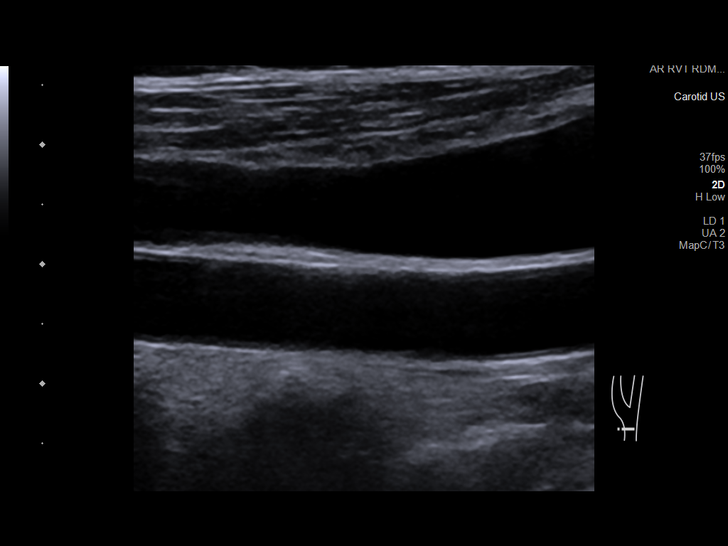
[im 12/68]
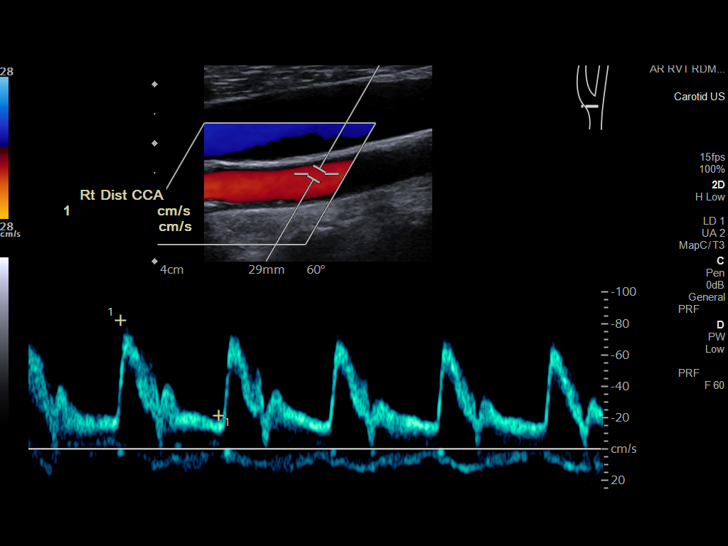
[im 18/68]
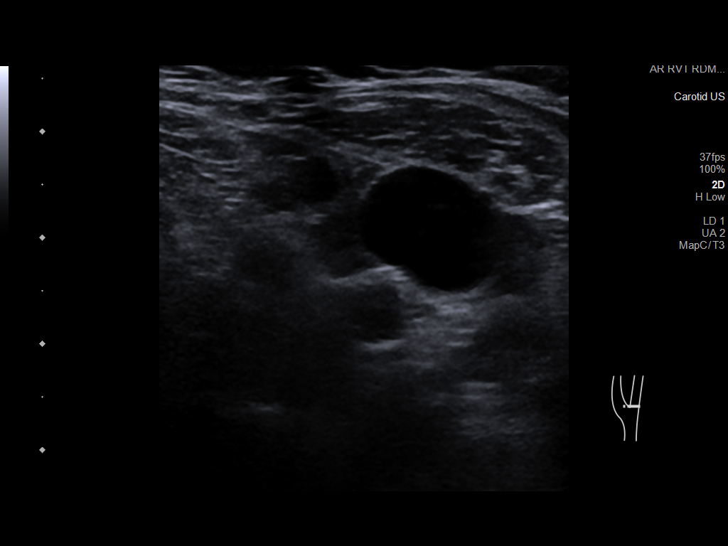
[im 24/68]
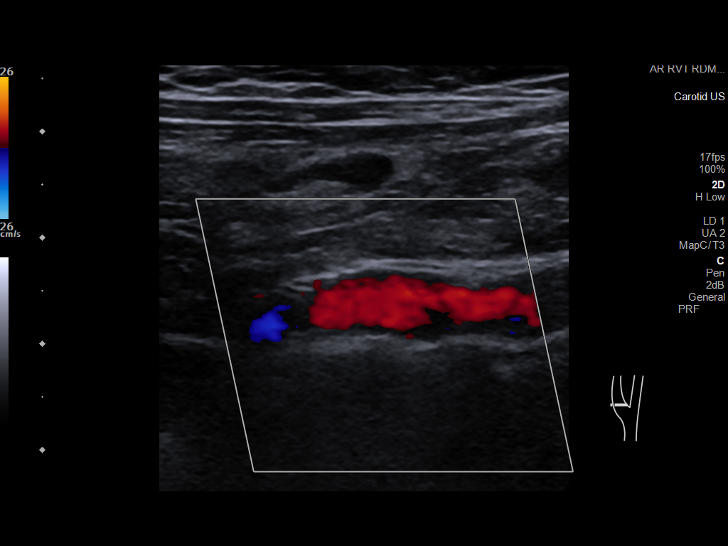
[im 30/68]
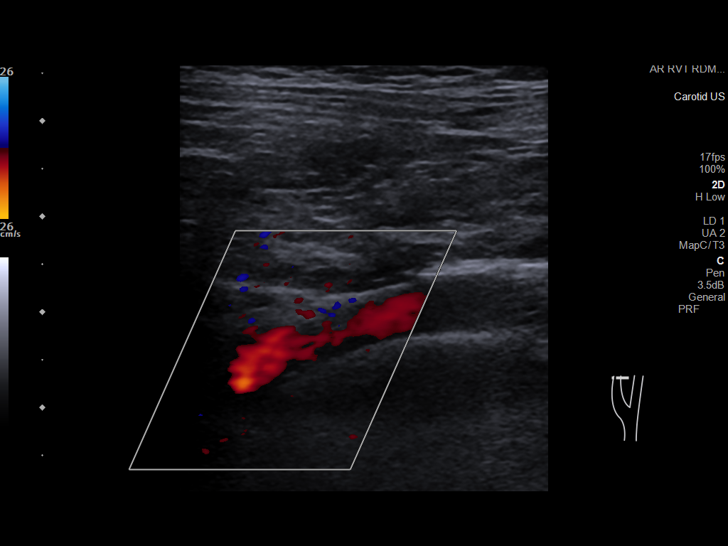
[im 35/68]
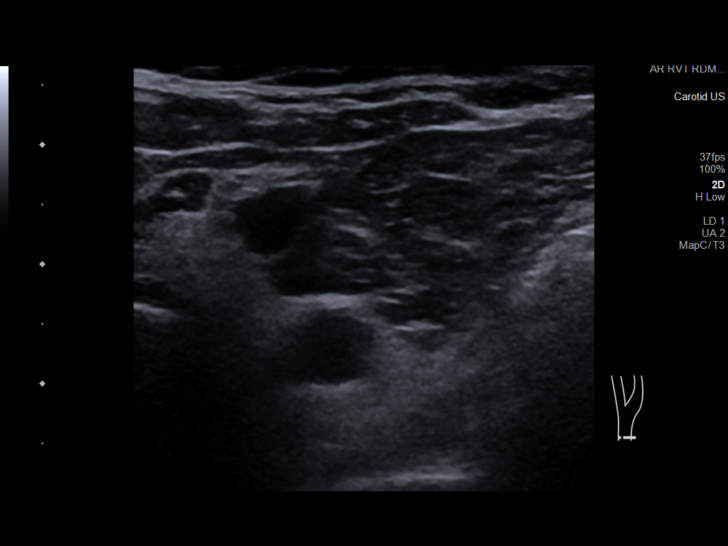
[im 38/68]
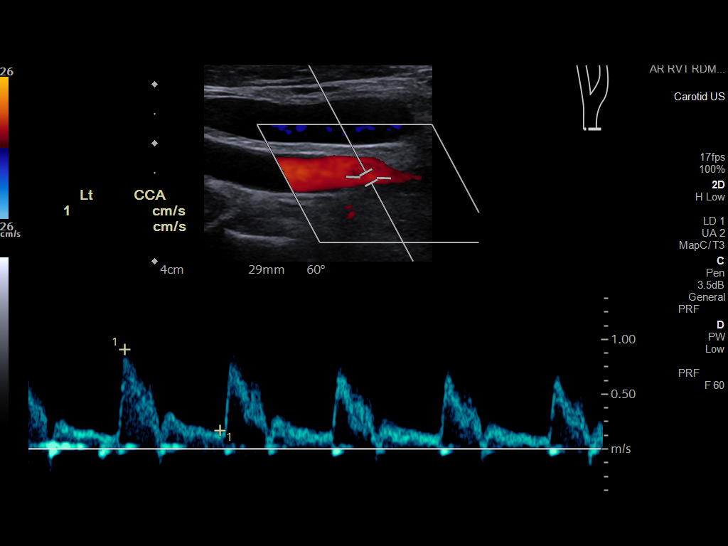
[im 44/68]
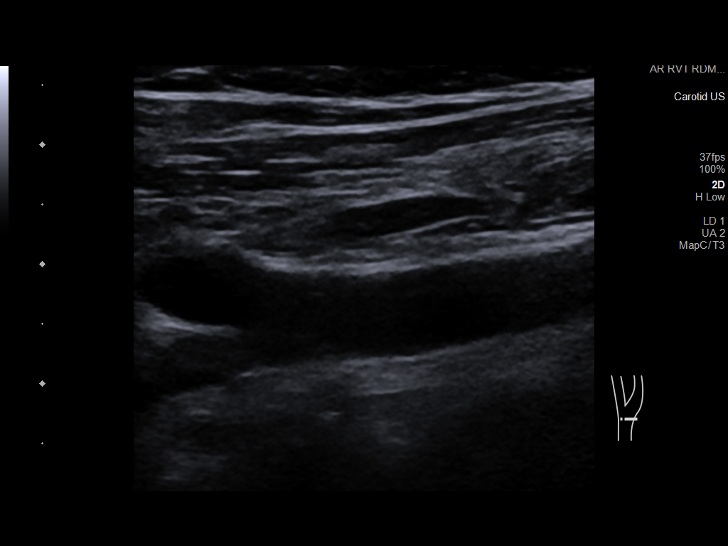
[im 50/68]
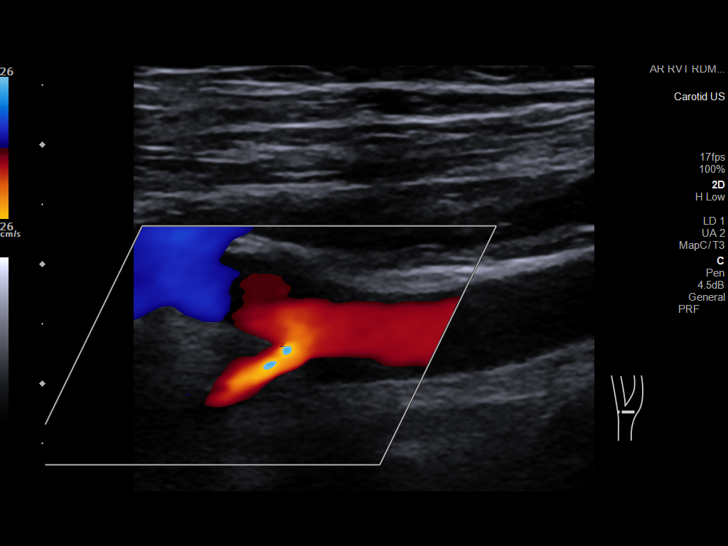
[im 56/68]
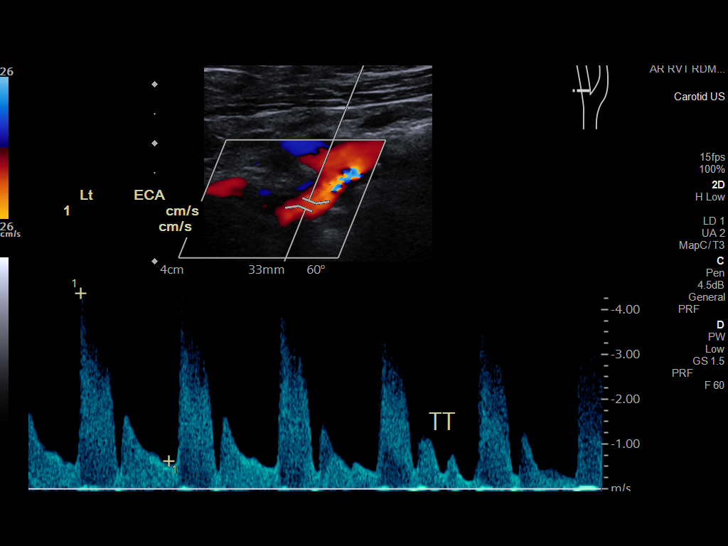
[im 62/68]
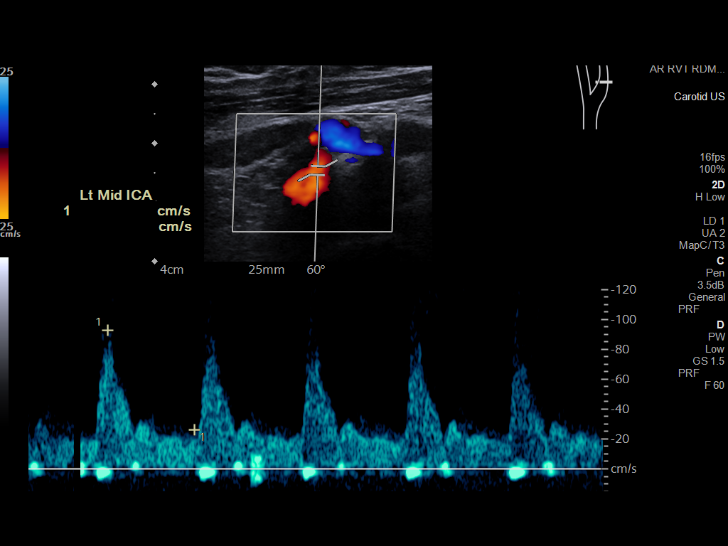
[im 68/68]
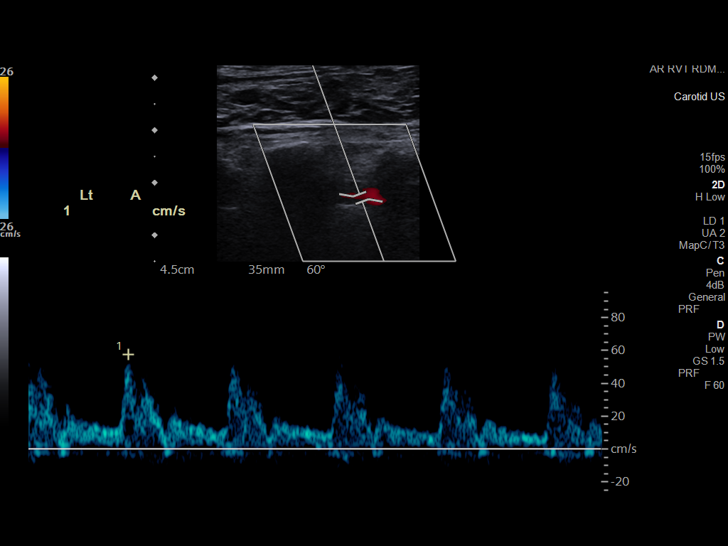

[13 of 24 positions shown; findings below may reference images not displayed]

FINDINGS: Criteria: Quantification of carotid stenosis is based on velocity
parameters that correlate the residual internal carotid diameter
with NASCET-based stenosis levels, using the diameter of the distal
internal carotid lumen as the denominator for stenosis measurement.

The following velocity measurements were obtained:

RIGHT

ICA:  Systolic 82 cm/sec, Diastolic 24 cm/sec

CCA:  77 cm/sec

SYSTOLIC ICA/CCA RATIO:

ECA:  360 cm/sec

LEFT

ICA:  Systolic 154 cm/sec, Diastolic 19 cm/sec

CCA:  87 cm/sec

SYSTOLIC ICA/CCA RATIO:

ECA:  436 cm/sec

Right Brachial SBP: Not acquired

Left Brachial SBP: Not acquired

RIGHT CAROTID ARTERY: No significant calcified disease of the right
common carotid artery. Intermediate waveform maintained. Patent
right-sided stent system with no elevated velocity. Low resistance
waveform of the right ICA. No significant tortuosity.

RIGHT VERTEBRAL ARTERY: Antegrade flow with low resistance waveform.

LEFT CAROTID ARTERY: No significant calcified disease of the left
common carotid artery. Intermediate waveform maintained. Moderate
homogeneous plaque at the left carotid bifurcation without
significant calcifications. Low resistance waveform of the left ICA.
Tortuosity.

LEFT VERTEBRAL ARTERY:  Antegrade flow with low resistance waveform.
IMPRESSION: Right:

Established duplex criteria have not been validated in the setting
of prior carotid stenting, however, there is patent right carotid
stent with no definite evidence of recurrent stenosis.

Left:

Moderate homogeneous plaque at the left carotid bifurcation, with
discordant results regarding degree of stenosis by established
duplex criteria. Peak velocity suggests 50%-69% stenosis, with the
ICA/ CCA ratio suggesting a lesser degree of stenosis. If
establishing a more accurate degree of stenosis is required,
cerebral angiogram should be considered, or as a second best test,
CTA.

## 2022-07-16 DIAGNOSIS — J449 Chronic obstructive pulmonary disease, unspecified: Secondary | ICD-10-CM | POA: Diagnosis not present

## 2022-07-23 DIAGNOSIS — J449 Chronic obstructive pulmonary disease, unspecified: Secondary | ICD-10-CM | POA: Diagnosis not present

## 2022-08-15 DIAGNOSIS — J449 Chronic obstructive pulmonary disease, unspecified: Secondary | ICD-10-CM | POA: Diagnosis not present

## 2022-08-20 IMAGING — DX DG CHEST 2V
2 series · 2 of 2 positions shown · non-contrast
Comparison: Previous studies including the chest radiograph done on
01/07/2022 and CT chest done on 07/15/2021

CLINICAL DATA: Bradycardia

EXAM:
CHEST - 2 VIEW

[chest pa]
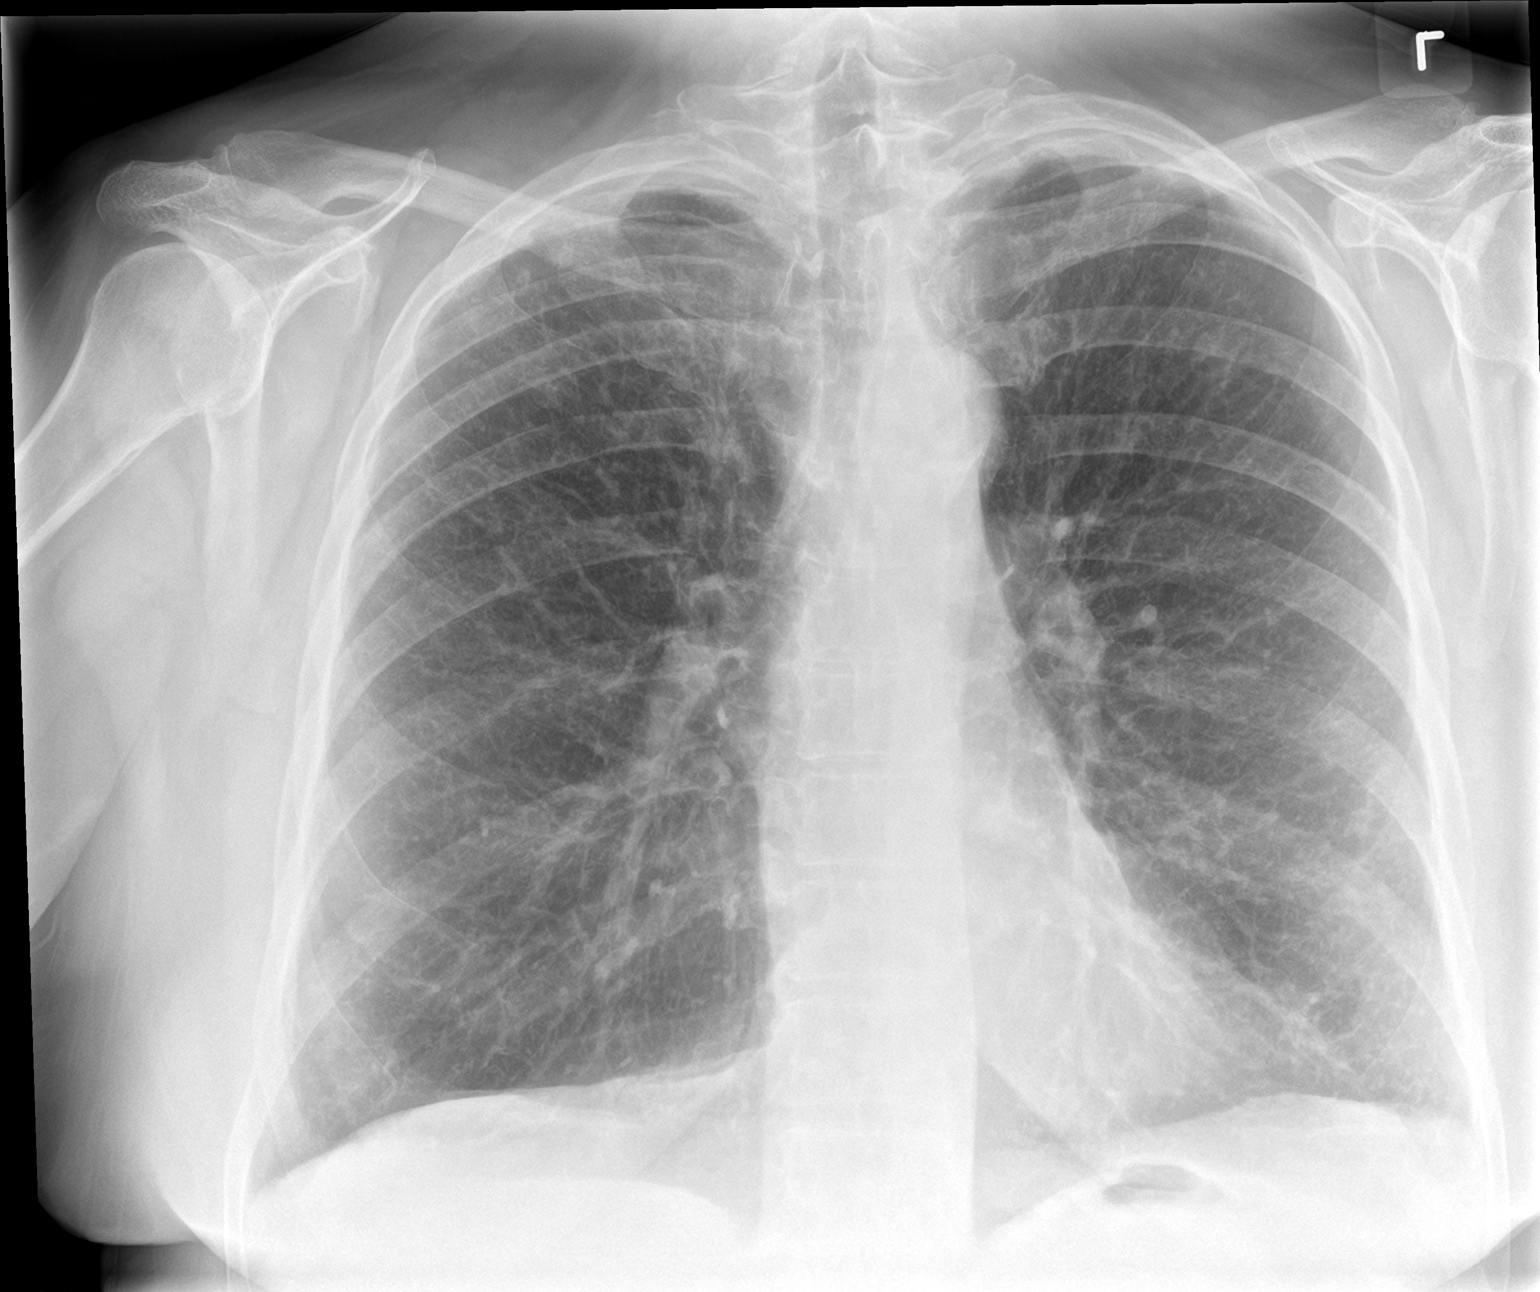

[chest lat]
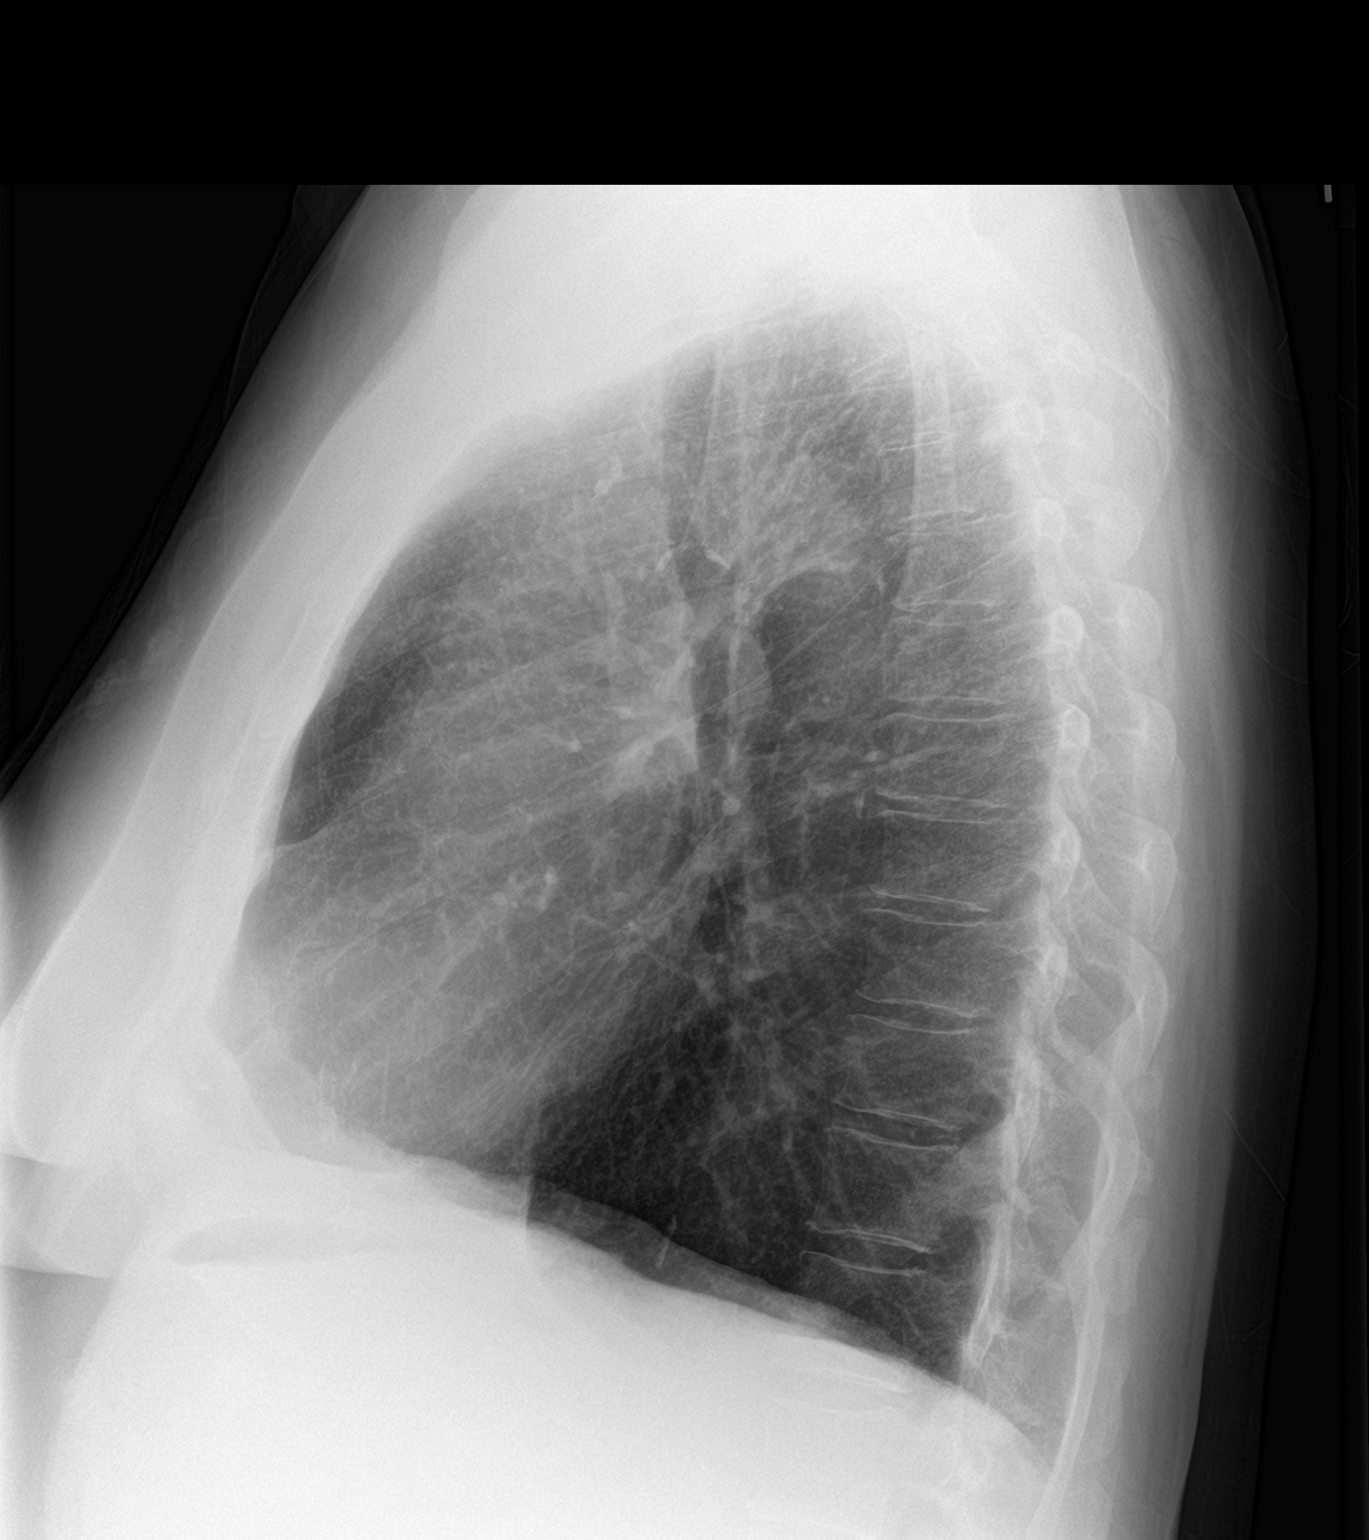

[2 of 2 positions shown; findings below may reference images not displayed]

FINDINGS: Cardiac size is within normal limits. Increase in AP diameter of
chest suggests COPD. Subtle increase in interstitial markings in the
right upper and left lower lung fields have not changed suggesting
scarring. No new focal infiltrates are seen. There is no pleural
effusion or pneumothorax.
IMPRESSION: COPD.  There are no new infiltrates or signs of pulmonary edema.

## 2022-08-22 DIAGNOSIS — J449 Chronic obstructive pulmonary disease, unspecified: Secondary | ICD-10-CM | POA: Diagnosis not present

## 2022-08-24 DIAGNOSIS — I10 Essential (primary) hypertension: Secondary | ICD-10-CM | POA: Diagnosis not present

## 2022-08-24 DIAGNOSIS — E559 Vitamin D deficiency, unspecified: Secondary | ICD-10-CM | POA: Diagnosis not present

## 2022-08-24 DIAGNOSIS — R7303 Prediabetes: Secondary | ICD-10-CM | POA: Diagnosis not present

## 2022-08-24 DIAGNOSIS — E039 Hypothyroidism, unspecified: Secondary | ICD-10-CM | POA: Diagnosis not present

## 2022-08-24 DIAGNOSIS — E782 Mixed hyperlipidemia: Secondary | ICD-10-CM | POA: Diagnosis not present

## 2022-08-30 DIAGNOSIS — J449 Chronic obstructive pulmonary disease, unspecified: Secondary | ICD-10-CM | POA: Diagnosis not present

## 2022-08-30 DIAGNOSIS — M1990 Unspecified osteoarthritis, unspecified site: Secondary | ICD-10-CM | POA: Diagnosis not present

## 2022-08-30 DIAGNOSIS — R232 Flushing: Secondary | ICD-10-CM | POA: Diagnosis not present

## 2022-08-30 DIAGNOSIS — I2510 Atherosclerotic heart disease of native coronary artery without angina pectoris: Secondary | ICD-10-CM | POA: Diagnosis not present

## 2022-08-30 DIAGNOSIS — F17200 Nicotine dependence, unspecified, uncomplicated: Secondary | ICD-10-CM | POA: Diagnosis not present

## 2022-08-30 DIAGNOSIS — E782 Mixed hyperlipidemia: Secondary | ICD-10-CM | POA: Diagnosis not present

## 2022-08-30 DIAGNOSIS — I10 Essential (primary) hypertension: Secondary | ICD-10-CM | POA: Diagnosis not present

## 2022-08-30 DIAGNOSIS — E039 Hypothyroidism, unspecified: Secondary | ICD-10-CM | POA: Diagnosis not present

## 2022-08-30 DIAGNOSIS — E538 Deficiency of other specified B group vitamins: Secondary | ICD-10-CM | POA: Diagnosis not present

## 2022-08-30 DIAGNOSIS — R7303 Prediabetes: Secondary | ICD-10-CM | POA: Diagnosis not present

## 2022-08-30 DIAGNOSIS — E559 Vitamin D deficiency, unspecified: Secondary | ICD-10-CM | POA: Diagnosis not present

## 2022-08-31 ENCOUNTER — Other Ambulatory Visit: Payer: Self-pay | Admitting: Nurse Practitioner

## 2022-08-31 DIAGNOSIS — Z1239 Encounter for other screening for malignant neoplasm of breast: Secondary | ICD-10-CM

## 2022-09-15 DIAGNOSIS — J449 Chronic obstructive pulmonary disease, unspecified: Secondary | ICD-10-CM | POA: Diagnosis not present

## 2022-09-19 ENCOUNTER — Ambulatory Visit
Admission: EM | Admit: 2022-09-19 | Discharge: 2022-09-19 | Disposition: A | Discharge status: Home / Self Care | Payer: Medicare Other | Attending: Nurse Practitioner | Admitting: Nurse Practitioner

## 2022-09-19 ENCOUNTER — Ambulatory Visit (INDEPENDENT_AMBULATORY_CARE_PROVIDER_SITE_OTHER): Payer: Medicare Other

## 2022-09-19 DIAGNOSIS — J441 Chronic obstructive pulmonary disease with (acute) exacerbation: Secondary | ICD-10-CM | POA: Diagnosis not present

## 2022-09-19 DIAGNOSIS — J069 Acute upper respiratory infection, unspecified: Secondary | ICD-10-CM | POA: Diagnosis not present

## 2022-09-19 DIAGNOSIS — Z792 Long term (current) use of antibiotics: Secondary | ICD-10-CM | POA: Diagnosis not present

## 2022-09-19 DIAGNOSIS — R0602 Shortness of breath: Secondary | ICD-10-CM | POA: Insufficient documentation

## 2022-09-19 DIAGNOSIS — R051 Acute cough: Secondary | ICD-10-CM | POA: Insufficient documentation

## 2022-09-19 DIAGNOSIS — J439 Emphysema, unspecified: Secondary | ICD-10-CM | POA: Diagnosis not present

## 2022-09-19 DIAGNOSIS — Z7952 Long term (current) use of systemic steroids: Secondary | ICD-10-CM | POA: Diagnosis not present

## 2022-09-19 DIAGNOSIS — R059 Cough, unspecified: Secondary | ICD-10-CM | POA: Diagnosis not present

## 2022-09-19 DIAGNOSIS — Z8701 Personal history of pneumonia (recurrent): Secondary | ICD-10-CM | POA: Insufficient documentation

## 2022-09-19 DIAGNOSIS — Z1152 Encounter for screening for COVID-19: Secondary | ICD-10-CM | POA: Diagnosis not present

## 2022-09-19 LAB — RESP PANEL BY RT-PCR (FLU A&B, COVID) ARPGX2
Influenza A by PCR: NEGATIVE
Influenza B by PCR: NEGATIVE
SARS Coronavirus 2 by RT PCR: NEGATIVE

## 2022-09-19 MED ORDER — AZITHROMYCIN 250 MG PO TABS: ORAL_TABLET | ORAL | 0 refills | Status: DC

## 2022-09-19 MED ORDER — IPRATROPIUM-ALBUTEROL 0.5-2.5 (3) MG/3ML IN SOLN
3.0000 mL | Freq: Once | RESPIRATORY_TRACT | Status: AC
  Administered 2022-09-19: 3 mL via RESPIRATORY_TRACT

## 2022-09-19 MED ORDER — PREDNISONE 20 MG PO TABS: 40.0000 mg | ORAL_TABLET | Freq: Every day | ORAL | 0 refills | Status: AC

## 2022-09-19 NOTE — Discharge Instructions (Signed)
We have given you a DuoNeb breathing treatment today.  This helped a lot of the wheezing in your lung fields today.  Recommend continuing this every 4-6 hours as needed for the next couple of days while you are in this acute phase of sickness.  In addition, please start the prednisone and take it daily in the morning for 5 days.  Continue plain guaifenesin or Mucinex to help break up the congestion.  If your symptoms worsen or not better after 48 hours of prednisone, start the azithromycin.  The chest x-ray does not show pneumonia or an acute cardiopulmonary process.  We have tested you for COVID-19, influenza, and RSV and we will call you tomorrow with positive results and will prescribe Tamiflu or an antiviral for COVID-19 if you test positive.  Please continue making sure you are drinking plenty of fluids.  If you develop severe shortness of breath that does not get better with the breathing treatments, go to the emergency room or call 911.

## 2022-09-19 NOTE — ED Triage Notes (Signed)
Patient presents to UC for cough, SOB, and HA since 3 days ago. Hx of COPD, exposed to RSV. Wears oxygen at home 3L, states she is concerned she may be exp COPD exacerbation or has RSV. Treating with ibuprofen and albuterol inhaler.

## 2022-09-19 NOTE — ED Provider Notes (Signed)
[facility]    CSN: [id number] Arrival date & time: 09/19/22  1024      History   Chief Complaint Chief Complaint  Patient presents with   Cough   Shortness of Breath   Headache    HPI Tabitha Schmidt is a 66 y.o. female.   Patient presents for 3 days of body aches, chills, congested cough, shortness of breath worse than her baseline, wheezing that is worse at nighttime, chest soreness from coughing, chest tightness, chest and nasal congestion, runny nose, headache above her eyes, decreased appetite, and fatigue.  She denies postnasal drainage, sore throat, ear pain or pressure, abdominal pain, nausea/vomiting, diarrhea, and loss of taste or smell.  Reports last week she was in the hospital with her granddaughter who had RSV.  Reports approximately 3 weeks ago, she was treated for a COPD exacerbation with doxycycline and prednisone and did improve after she was treated last time.  Reports she has been taking her Breztri 2 puffs twice daily and using rescue albuterol inhaler 6-7 times daily.  Reports she has not used her DuoNeb nebulizer so far.  Has also been taking ibuprofen for the headache.  Patient has a history of COPD, on 3 L nasal cannula chronically.  She is a current smoker.  Patient also reports she does not recall having an allergy to omeprazole and wants me to take it off of her record.    Past Medical History:  Diagnosis Date   Anxiety    Arthritis    Asthma    Cataracts, bilateral    Gershon Crane   Complication of anesthesia    Note: pt has hx of TMJ " every now and then my jaw will lock "   COPD (chronic obstructive pulmonary disease) (HCC)    Coronary artery disease    a. s/p DES to mid-RCA in 05/2019   DM (diabetes mellitus) ([place])    "came off medicines with dr's permission"   Family history of adverse reaction to anesthesia    " my son woke up crying "   Fatty liver    Fibromyalgia    GERD (gastroesophageal reflux disease)    Hyperlipidemia     stopped meds on her own   Hypothyroid    Myocardial infarction ([place])    On supplemental oxygen therapy    at night and PRN   Pneumonia    Shortness of breath    Stenosis of right carotid artery    TMJ (dislocation of temporomandibular joint)     Patient Active Problem List   Diagnosis Date Noted   Foreign body in left ear 11/16/2021   Nicotine dependence 08/05/2021   Mixed anxiety and depressive disorder 05/05/2021   Atherosclerosis of coronary artery without angina pectoris 05/04/2021   Flushing 05/04/2021   Osteoarthritis 05/04/2021   Anxiety 03/14/2021   Chronic insomnia 03/14/2021   Essential hypertension 03/14/2021   Low back pain 03/14/2021   Menopausal syndrome 03/14/2021   Palpitations 03/14/2021   Polyarthropathy 03/14/2021   Cigarette smoker 08/12/2020   Carotid artery stenosis 08/02/2019   Chest pain 05/18/2019   MI (myocardial infarction) ([place]) 05/18/2019   Non-ST elevation (NSTEMI) myocardial infarction (HCC)    Elevated troponin    Hyperlipidemia    COPD  ? GOLD Stage/ active smoker    Chest pain in adult 05/13/2019   Hyperglycemia 07/10/2015   Vitamin B12 deficiency 05/06/2015   Vitamin D deficiency 05/06/2015   Chronic fatigue 12/16/2014   Hypothyroidism 12/16/2014  GERD (gastroesophageal reflux disease) 01/03/2012   Plantar fasciitis 05/26/2011   PLANTAR FACIITIS 11/24/2010    Past Surgical History:  Procedure Laterality Date   ABDOMINAL HYSTERECTOMY     BREAST SURGERY     removal of bilateral benign tumors   CARDIAC CATHETERIZATION     2010   CHOLECYSTECTOMY     CORONARY STENT INTERVENTION N/A 05/18/2019   Procedure: CORONARY STENT INTERVENTION;  Surgeon: Lorretta Harp, MD;  Location: [place] [facility];  Service: Cardiovascular;  Laterality: N/A;   DILATION AND CURETTAGE OF UTERUS     ESOPHAGOGASTRODUODENOSCOPY  01/26/2012   Procedure: ESOPHAGOGASTRODUODENOSCOPY (EGD);  Surgeon: Rogene Houston, MD;  [facility];  Service:  Endoscopy;  Laterality: N/A;  730   LEFT HEART CATH AND CORONARY ANGIOGRAPHY N/A 05/18/2019   Procedure: LEFT HEART CATH AND CORONARY ANGIOGRAPHY;  Surgeon: Lorretta Harp, MD;  Location: [place] [facility];  Service: Cardiovascular;  Laterality: N/A;   TRANSCAROTID ARTERY REVASCULARIZATION  Right 08/02/2019   Procedure: TRANSCAROTID ARTERY REVASCULARIZATION RIGHT;  Surgeon: Serafina Mitchell, MD;  Location: [facility];  Service: Vascular;  Laterality: Right;   TUBAL LIGATION      OB History   No obstetric history on file.      Home Medications    Prior to Admission medications   Medication Sig Start Date End Date Taking? Authorizing Provider  azithromycin (ZITHROMAX) 250 MG tablet Take (2) tablets by mouth on day 1, then take (1) tablet by mouth on days 2-5. 09/19/22  Yes Eulogio Bear, NP  predniSONE (DELTASONE) 20 MG tablet Take 2 tablets (40 mg total) by mouth daily with breakfast for 5 days. 09/19/22 09/24/22 Yes Eulogio Bear, NP  aspirin EC 81 MG EC tablet Take 1 tablet (81 mg total) by mouth daily. 05/15/19   Barton Dubois, MD  atorvastatin (LIPITOR) 80 MG tablet TAKE ONE TABLET BY MOUTH AT 6PM. Patient taking differently: Take 80 mg by mouth in the morning. 09/30/20   Arnoldo Lenis, MD  BREZTRI AEROSPHERE 160-9-4.8 MCG/ACT AERO Inhale 2 puffs into the lungs daily. 03/19/20   [provider]  guaiFENesin (MUCINEX) 600 MG 12 hr tablet Take 1,200 mg by mouth 2 (two) times daily. Patient not taking: Reported on 04/19/2022    [provider]  hydrochlorothiazide (HYDRODIURIL) 12.5 MG tablet Take 12.5 mg by mouth daily. 01/11/22   [provider]  LORazepam (ATIVAN) 0.5 MG tablet Take 0.5 mg by mouth 4 (four) times daily as needed. 06/29/20   [provider]  meclizine (ANTIVERT) 25 MG tablet Take 25 mg by mouth every 6 (six) hours. 02/17/20   [provider]  metoprolol tartrate (LOPRESSOR) 50 MG tablet Take 1 tablet (50 mg total)  by mouth 2 (two) times daily. 05/17/22 05/12/23  Richardson Dopp T, PA-C  NALTREXONE HCL PO Take 4.5 mg by mouth every morning.    [provider]  nitroGLYCERIN (NITROSTAT) 0.4 MG SL tablet Place 1 tablet (0.4 mg total) under the tongue every 5 (five) minutes as needed for chest pain. 02/10/21 04/12/22  Josue Hector, MD  potassium chloride (KLOR-CON) 10 MEQ tablet Take 1 tablet (10 mEq total) by mouth daily. 04/21/22 04/16/23  Imogene Burn, PA-C  PROAIR HFA 108 [phone] Base) MCG/ACT inhaler Inhale 1 puff into the lungs every 6 (six) hours as needed for wheezing or shortness of breath.  01/15/19   [provider]  RESTASIS 0.05 % ophthalmic emulsion 1 drop 2 (  two) times daily. 01/27/21   [provider]  thyroid (ARMOUR) 90 MG tablet Take 90 mg by mouth daily.    [provider]    Family History Family History  Problem Relation Age of Onset   CAD Father    Anesthesia problems Neg Hx    Hypotension Neg Hx    Malignant hyperthermia Neg Hx    Pseudochol deficiency Neg Hx     Social History Social History   Tobacco Use   Smoking status: Former    Packs/day: 0.50    Years: 40.00    Total pack years: 20.00    Types: Cigarettes, E-cigarettes    Start date: 12/2021    Quit date: 12/2021    Years since quitting: 0.7   Smokeless tobacco: Never  Vaping Use   Vaping Use: Never used  Substance Use Topics   Alcohol use: No   Drug use: No    Types: Cocaine     Allergies   Patient has no known allergies.   Review of Systems Review of Systems Per HPI  Physical Exam Triage Vital Signs ED Triage Vitals  Enc Vitals Group     BP 09/19/22 1104 (!) 148/62     Pulse Rate 09/19/22 1104 98     Resp 09/19/22 1104 20     Temp 09/19/22 1104 99.2 F (37.3 C)     Temp Source 09/19/22 1104 Oral     SpO2 09/19/22 1104 97 %     Weight --      Height --      Head Circumference --      Peak Flow --      Pain Score 09/19/22 1103 2     Pain Loc --      Pain  Edu? --      Excl. in GC? --    No data found.  Updated Vital Signs BP (!) 148/62 (BP Location: Right Arm)   Pulse 98   Temp 99.2 F (37.3 C) (Oral)   Resp 20   SpO2 97%   Visual Acuity Right Eye Distance:   Left Eye Distance:   Bilateral Distance:    Right Eye Near:   Left Eye Near:    Bilateral Near:     Physical Exam Vitals and nursing note reviewed.  Constitutional:      General: She is not in acute distress.    Appearance: Normal appearance. She is not ill-appearing or toxic-appearing.  HENT:     Head: Normocephalic and atraumatic.     Right Ear: Tympanic membrane, ear canal and external ear normal. There is no impacted cerumen.     Left Ear: Tympanic membrane, ear canal and external ear normal. There is no impacted cerumen.     Nose: No congestion or rhinorrhea.     Mouth/Throat:     Mouth: Mucous membranes are moist.     Pharynx: Oropharynx is clear.  Eyes:     General: No scleral icterus.    Extraocular Movements: Extraocular movements intact.  Cardiovascular:     Rate and Rhythm: Normal rate and regular rhythm.  Pulmonary:     Effort: Pulmonary effort is normal. No respiratory distress.     Breath sounds: Wheezing present. No rhonchi or rales.  Abdominal:     General: Abdomen is flat. Bowel sounds are normal. There is no distension.     Palpations: Abdomen is soft.     Tenderness: There is no abdominal tenderness.  Musculoskeletal:  Cervical back: Normal range of motion and neck supple.  Lymphadenopathy:     Cervical: No cervical adenopathy.  Skin:    General: Skin is warm and dry.     Coloration: Skin is not jaundiced or pale.     Findings: No erythema or rash.  Neurological:     Mental Status: She is alert and oriented to person, place, and time.  Psychiatric:        Behavior: Behavior is cooperative.      UC Treatments / Results  Labs (all labs ordered are listed, but only abnormal results are displayed) Labs Reviewed  RESP PANEL BY  RT-PCR (FLU A&B, COVID) ARPGX2  RESP PANEL BY RT-PCR (RSV, FLU A&B, COVID)  RVPGX2    EKG   Radiology DG Chest 2 View  Result Date: 09/19/2022 CLINICAL DATA:  Cough and shortness of breath for 3 weeks, history COPD, pneumonia EXAM: CHEST - 2 VIEW COMPARISON:  04/12/2022 FINDINGS: Normal heart size, mediastinal contours, and pulmonary vascularity. Atherosclerotic calcification aorta. Emphysematous changes with RIGHT apical scarring. No acute infiltrate, pleural effusion, or pneumothorax. Osseous structures unremarkable. IMPRESSION: Emphysematous changes with RIGHT apical scarring. No acute abnormalities. Aortic Atherosclerosis (ICD10-I70.0) and Emphysema (ICD10-J43.9). Electronically Signed   By: Ulyses Southward M.D.   On: 09/19/2022 11:41    Procedures Procedures (including critical care time)  Medications Ordered in UC Medications  ipratropium-albuterol (DUONEB) 0.5-2.5 (3) MG/3ML nebulizer solution 3 mL (3 mLs Nebulization Given 09/19/22 1144)    Initial Impression / Assessment and Plan / UC Course  I have reviewed the triage vital signs and the nursing notes.  Pertinent labs & imaging results that were available during my care of the patient were reviewed by me and considered in my medical decision making (see chart for details).   Patient is well-appearing, normotensive, afebrile, not tachycardic, not tachypneic, oxygenating well on room air.    Acute cough Viral URI with cough COPD exacerbation (HCC) Suspect viral upper respiratory infection Influenza, COVID-19, RSV testing obtained per patient request Patient is a good candidate for Tamiflu or Paxlovid if she tests positive.  She would need to hold Breztri and atorvastatin while on Paxlovid.  DuoNeb given in urgent care which improved wheezing significantly Chest x-ray today negative for acute cardiopulmonary process Supportive care discussed Given wheezing, history of COPD, will treat patient with prednisone 40 mg daily for 5  days along with azithromycin ER and return precautions discussed with patient  The patient was given the opportunity to ask questions.  All questions answered to their satisfaction.  The patient is in agreement to this plan.    Final Clinical Impressions(s) / UC Diagnoses   Final diagnoses:  Acute cough  Viral URI with cough  COPD exacerbation [facility])     Discharge Instructions      We have given you a DuoNeb breathing treatment today.  This helped a lot of the wheezing in your lung fields today.  Recommend continuing this every 4-6 hours as needed for the next couple of days while you are in this acute phase of sickness.  In addition, please start the prednisone and take it daily in the morning for 5 days.  Continue plain guaifenesin or Mucinex to help break up the congestion.  If your symptoms worsen or not better after 48 hours of prednisone, start the azithromycin.  The chest x-ray does not show pneumonia or an acute cardiopulmonary process.  We have tested you for COVID-19, influenza, and RSV and we will call  you tomorrow with positive results and will prescribe Tamiflu or an antiviral for COVID-19 if you test positive.  Please continue making sure you are drinking plenty of fluids.  If you develop severe shortness of breath that does not get better with the breathing treatments, go to the emergency room or call 911.     ED Prescriptions     Medication Sig Dispense Auth. Provider   predniSONE (DELTASONE) 20 MG tablet Take 2 tablets (40 mg total) by mouth daily with breakfast for 5 days. 10 tablet Noemi Chapel A, NP   azithromycin (ZITHROMAX) 250 MG tablet Take (2) tablets by mouth on day 1, then take (1) tablet by mouth on days 2-5. 6 tablet Eulogio Bear, NP      PDMP not reviewed this encounter.   Eulogio Bear, NP 09/19/22 1734

## 2022-09-22 DIAGNOSIS — J449 Chronic obstructive pulmonary disease, unspecified: Secondary | ICD-10-CM | POA: Diagnosis not present

## 2022-10-03 DIAGNOSIS — H109 Unspecified conjunctivitis: Secondary | ICD-10-CM | POA: Diagnosis not present

## 2022-10-15 DIAGNOSIS — J449 Chronic obstructive pulmonary disease, unspecified: Secondary | ICD-10-CM | POA: Diagnosis not present

## 2022-10-17 NOTE — Progress Notes (Signed)
CARDIOLOGY CONSULT NOTE       Patient ID: Tabitha Schmidt MRN: [id number] DOB/AGE: 1956-06-16 66 y.o.  Admit date: (Not on file) Referring Physician: [name] Primary Physician: [name], MD Primary Cardiologist: [name] -> Javontay Vandam F/U  Palpitations   HPI:  66 y.o. referred by Dr [name] for palpitations 02/10/21 . She has been followed in past by [facility]. She has a history of right carotid stenting Known CAD with cath 05/18/19 for NSTEMI showing occluded mid/distal RCA stented with good results and no disease in left system EF 50-55% Last carotid duplex 02/23/22  with patent right stent and 50-69% % Left ICA stenosis patent stent in [place]   She is still smoking  I see her son She sees endocrine for hypothyroidism on replacement Her TSH has been suppressed repeatedly over the last year   She has an 68 yo grand daughter "adopted" living with her with severe mental health issues  Depression bipolar suicidal at times   Counseled on smoking cessation < 10 minutes  Lung cancer CT 03/13/21 suspicious subpleural nodular opacity in posterior apical RUL Consider PET/ CT in 3 months Repeat CT stable and seeing Dr [name] in pulmonary   Seen in ED 01/07/22 with dyspnea COVID positive Given Paxlovid She has had vaccines and booster COPD on oxygen at night 2L's Rx steroids/ nebs CXR NAD just chronic emphysema BNP only 124  Has not smoked for 2 months using Chantix No TIA symptoms no angina Compliant with meds   ROS All other systems reviewed and negative except as noted above  Past Medical History:  Diagnosis Date   Anxiety    Arthritis    Asthma    Cataracts, bilateral    [name]   Complication of anesthesia    Note: pt has hx of TMJ " every now and then my jaw will lock "   COPD (chronic obstructive pulmonary disease) (HCC)    Coronary artery disease    a. s/p DES to mid-RCA in 05/2019   DM (diabetes mellitus) (HCC)    "came off medicines with dr's permission"   Family history of adverse reaction to  anesthesia    " my son woke up crying "   Fatty liver    Fibromyalgia    GERD (gastroesophageal reflux disease)    Hyperlipidemia    stopped meds on her own   Hypothyroid    Myocardial infarction (HCC)    On supplemental oxygen therapy    at night and PRN   Pneumonia    Shortness of breath    Stenosis of right carotid artery    TMJ (dislocation of temporomandibular joint)     Family History  Problem Relation Age of Onset   CAD Father    Anesthesia problems Neg Hx    Hypotension Neg Hx    Malignant hyperthermia Neg Hx    Pseudochol deficiency Neg Hx     Social History   Socioeconomic History   Marital status: Single    Spouse name: Not on file   Number of children: Not on file   Years of education: Not on file   Highest education level: Not on file  Occupational History   Not on file  Tobacco Use   Smoking status: Former    Packs/day: 0.50    Years: 40.00    Total pack years: 20.00    Types: Cigarettes, E-cigarettes    Start date: 12/2021    Quit date: 12/2021    Years since  quitting: 0.8   Smokeless tobacco: Never  Vaping Use   Vaping Use: Never used  Substance and Sexual Activity   Alcohol use: No   Drug use: No    Types: Cocaine   Sexual activity: Yes    Birth control/protection: Surgical  Other Topics Concern   Not on file  Social History Narrative   Not on file   Social Determinants of Health   Financial Resource Strain: Not on file  Food Insecurity: Not on file  Transportation Needs: Not on file  Physical Activity: Not on file  Stress: Not on file  Social Connections: Not on file  Intimate Partner Violence: Not on file    Past Surgical History:  Procedure Laterality Date   ABDOMINAL HYSTERECTOMY     BREAST SURGERY     removal of bilateral benign tumors   CARDIAC CATHETERIZATION     2010   CHOLECYSTECTOMY     CORONARY STENT INTERVENTION N/A 05/18/2019   Procedure: CORONARY STENT INTERVENTION;  Surgeon: Lorretta Harp, MD;  Location: [place] [facility];  Service: Cardiovascular;  Laterality: N/A;   DILATION AND CURETTAGE OF UTERUS     ESOPHAGOGASTRODUODENOSCOPY  01/26/2012   Procedure: ESOPHAGOGASTRODUODENOSCOPY (EGD);  Surgeon: Rogene Houston, MD;  [facility];  Service: Endoscopy;  Laterality: N/A;  730   LEFT HEART CATH AND CORONARY ANGIOGRAPHY N/A 05/18/2019   Procedure: LEFT HEART CATH AND CORONARY ANGIOGRAPHY;  Surgeon: Lorretta Harp, MD;  Location: [place] [facility];  Service: Cardiovascular;  Laterality: N/A;   TRANSCAROTID ARTERY REVASCULARIZATION  Right 08/02/2019   Procedure: TRANSCAROTID ARTERY REVASCULARIZATION RIGHT;  Surgeon: Serafina Mitchell, MD;  Location: [facility];  Service: Vascular;  Laterality: Right;   TUBAL LIGATION        Current Outpatient Medications:    aspirin EC 81 MG EC tablet, Take 1 tablet (81 mg total) by mouth daily., Disp: 30 tablet, Rfl: 3   atorvastatin (LIPITOR) 80 MG tablet, TAKE ONE TABLET BY MOUTH AT 6PM. (Patient taking differently: Take 80 mg by mouth in the morning.), Disp: 90 tablet, Rfl: 0   azithromycin (ZITHROMAX) 250 MG tablet, Take (2) tablets by mouth on day 1, then take (1) tablet by mouth on days 2-5., Disp: 6 tablet, Rfl: 0   BREZTRI AEROSPHERE 160-9-4.8 MCG/ACT AERO, Inhale 2 puffs into the lungs daily., Disp: , Rfl:    guaiFENesin (MUCINEX) 600 MG 12 hr tablet, Take 1,200 mg by mouth 2 (two) times daily. (Patient not taking: Reported on 04/19/2022), Disp: , Rfl:    hydrochlorothiazide (HYDRODIURIL) 12.5 MG tablet, Take 12.5 mg by mouth daily., Disp: , Rfl:    LORazepam (ATIVAN) 0.5 MG tablet, Take 0.5 mg by mouth 4 (four) times daily as needed., Disp: , Rfl:    meclizine (ANTIVERT) 25 MG tablet, Take 25 mg by mouth every 6 (six) hours., Disp: , Rfl:    metoprolol tartrate (LOPRESSOR) 50 MG tablet, Take 1 tablet (50 mg total) by mouth 2 (two) times daily., Disp: 180 tablet, Rfl: 3   NALTREXONE HCL PO, Take 4.5 mg by mouth every morning., Disp: , Rfl:     nitroGLYCERIN (NITROSTAT) 0.4 MG SL tablet, Place 1 tablet (0.4 mg total) under the tongue every 5 (five) minutes as needed for chest pain., Disp: 25 tablet, Rfl: 3   potassium chloride (KLOR-CON) 10 MEQ tablet, Take 1 tablet (10 mEq total) by mouth daily., Disp: 90 tablet, Rfl: 3   PROAIR HFA 108 (90 Base) MCG/ACT  inhaler, Inhale 1 puff into the lungs every 6 (six) hours as needed for wheezing or shortness of breath. , Disp: , Rfl:    RESTASIS 0.05 % ophthalmic emulsion, 1 drop 2 (two) times daily., Disp: , Rfl:    thyroid (ARMOUR) 90 MG tablet, Take 90 mg by mouth daily., Disp: , Rfl:     Physical Exam: There were no vitals taken for this visit.    Affect appropriate Healthy:  appears stated age 66: normal Neck supple with no adenopathy JVP normal no bruits no thyromegaly Lungs clear with no wheezing and good diaphragmatic motion Heart:  S1/S2 no murmur, no rub, gallop or click PMI normal Abdomen: benighn, BS positve, no tenderness, no AAA no bruit.  No HSM or HJR Distal pulses intact with no bruits No edema Neuro non-focal Skin warm and dry No muscular weakness   Labs:   Lab Results  Component Value Date   WBC 6.1 04/12/2022   HGB 15.2 (H) 04/12/2022   HCT 45.5 04/12/2022   MCV 93.2 04/12/2022   PLT 328 04/12/2022   No results for input(s): "NA", "K", "CL", "CO2", "BUN", "CREATININE", "CALCIUM", "PROT", "BILITOT", "ALKPHOS", "ALT", "AST", "GLUCOSE" in the last 168 hours.  Invalid input(s): "LABALBU" No results found for: "CKTOTAL", "CKMB", "CKMBINDEX", "TROPONINI"  Lab Results  Component Value Date   CHOL 181 05/14/2019   Lab Results  Component Value Date   HDL 35 (L) 05/14/2019   Lab Results  Component Value Date   LDLCALC 113 (H) 05/14/2019   Lab Results  Component Value Date   TRIG 167 (H) 05/14/2019   Lab Results  Component Value Date   CHOLHDL 5.2 05/14/2019   No results found for: "LDLDIRECT"    Radiology: DG Chest 2 View  Result Date:  09/19/2022 CLINICAL DATA:  Cough and shortness of breath for 3 weeks, history COPD, pneumonia EXAM: CHEST - 2 VIEW COMPARISON:  04/12/2022 FINDINGS: Normal heart size, mediastinal contours, and pulmonary vascularity. Atherosclerotic calcification aorta. Emphysematous changes with RIGHT apical scarring. No acute infiltrate, pleural effusion, or pneumothorax. Osseous structures unremarkable. IMPRESSION: Emphysematous changes with RIGHT apical scarring. No acute abnormalities. Aortic Atherosclerosis (ICD10-I70.0) and Emphysema (ICD10-J43.9). Electronically Signed   By: Lavonia Dana M.D.   On: 09/19/2022 11:41    EKG: 05/20/19 SR lateral T wave changes 10/17/2022 SB rate 64 normal    ASSESSMENT AND PLAN:   1. CAD:  Stenting of occluded mid/distal RCA July 2020 Continue ASA/statin/beta blocker No residual left sided disease no need for stress testing now  2. HLD on statin labs with primary  3. Smoking:  Counseled on cessation been good for 2 months continue Chantix  4. PVD: post stenting right ICA with residual 50-69% left ICA stenosis by duplex  02/23/22 F/U Brabham 5. Thyroid:  TSH with primary has been suppressed no recent labs in Epic 6. Palpitations: benign monitor 05/12/22 max 15 beats artrial tachycardia and < 1% PAC/PVCls continue lopressor   Carotid duplex 01/2023  F/U in  6 months    Signed: Jenkins Rouge 10/17/2022, 1:10 PM

## 2022-10-22 DIAGNOSIS — J449 Chronic obstructive pulmonary disease, unspecified: Secondary | ICD-10-CM | POA: Diagnosis not present

## 2022-10-26 ENCOUNTER — Encounter: Payer: Self-pay | Admitting: Cardiovascular Disease

## 2022-10-26 ENCOUNTER — Ambulatory Visit: Payer: Medicare Other | Attending: Cardiovascular Disease | Admitting: Cardiovascular Disease

## 2022-10-26 VITALS — BP 138/72 | HR 61 | Ht 67.0 in | Wt 183.4 lb

## 2022-10-26 DIAGNOSIS — Z95828 Presence of other vascular implants and grafts: Secondary | ICD-10-CM | POA: Diagnosis not present

## 2022-10-26 DIAGNOSIS — I2510 Atherosclerotic heart disease of native coronary artery without angina pectoris: Secondary | ICD-10-CM

## 2022-10-26 DIAGNOSIS — F17200 Nicotine dependence, unspecified, uncomplicated: Secondary | ICD-10-CM

## 2022-10-26 DIAGNOSIS — I25118 Atherosclerotic heart disease of native coronary artery with other forms of angina pectoris: Secondary | ICD-10-CM

## 2022-10-26 DIAGNOSIS — I6523 Occlusion and stenosis of bilateral carotid arteries: Secondary | ICD-10-CM | POA: Diagnosis not present

## 2022-10-26 NOTE — Patient Instructions (Signed)
Medication Instructions:  Your physician recommends that you continue on your current medications as directed. Please refer to the Current Medication list given to you today.   Labwork: None  Testing/Procedures: Your physician has requested that you have a carotid duplex. This test is an ultrasound of the carotid arteries in your neck. It looks at blood flow through these arteries that supply the brain with blood. Allow one hour for this exam. There are no restrictions or special instructions.   Follow-Up: Follow up in 6 months with Dr. Eden Emms.   Any Other Special Instructions Will Be Listed Below (If Applicable).     If you need a refill on your cardiac medications before your next appointment, please call your pharmacy.

## 2022-11-09 ENCOUNTER — Ambulatory Visit
Admission: RE | Admit: 2022-11-09 | Discharge: 2022-11-09 | Disposition: A | Discharge status: Home / Self Care | Payer: Medicare Other | Source: Ambulatory Visit | Attending: Nurse Practitioner | Admitting: Nurse Practitioner

## 2022-11-09 DIAGNOSIS — Z1239 Encounter for other screening for malignant neoplasm of breast: Secondary | ICD-10-CM

## 2022-11-09 DIAGNOSIS — Z1231 Encounter for screening mammogram for malignant neoplasm of breast: Secondary | ICD-10-CM | POA: Diagnosis not present

## 2022-11-22 DIAGNOSIS — J449 Chronic obstructive pulmonary disease, unspecified: Secondary | ICD-10-CM | POA: Diagnosis not present

## 2022-12-01 DIAGNOSIS — E039 Hypothyroidism, unspecified: Secondary | ICD-10-CM | POA: Diagnosis not present

## 2022-12-01 DIAGNOSIS — E559 Vitamin D deficiency, unspecified: Secondary | ICD-10-CM | POA: Diagnosis not present

## 2022-12-01 DIAGNOSIS — E782 Mixed hyperlipidemia: Secondary | ICD-10-CM | POA: Diagnosis not present

## 2022-12-01 DIAGNOSIS — I10 Essential (primary) hypertension: Secondary | ICD-10-CM | POA: Diagnosis not present

## 2022-12-01 DIAGNOSIS — R7303 Prediabetes: Secondary | ICD-10-CM | POA: Diagnosis not present

## 2022-12-07 DIAGNOSIS — E559 Vitamin D deficiency, unspecified: Secondary | ICD-10-CM | POA: Diagnosis not present

## 2022-12-07 DIAGNOSIS — I10 Essential (primary) hypertension: Secondary | ICD-10-CM | POA: Diagnosis not present

## 2022-12-07 DIAGNOSIS — I2510 Atherosclerotic heart disease of native coronary artery without angina pectoris: Secondary | ICD-10-CM | POA: Diagnosis not present

## 2022-12-07 DIAGNOSIS — J449 Chronic obstructive pulmonary disease, unspecified: Secondary | ICD-10-CM | POA: Diagnosis not present

## 2022-12-07 DIAGNOSIS — E538 Deficiency of other specified B group vitamins: Secondary | ICD-10-CM | POA: Diagnosis not present

## 2022-12-07 DIAGNOSIS — L853 Xerosis cutis: Secondary | ICD-10-CM | POA: Diagnosis not present

## 2022-12-07 DIAGNOSIS — M1990 Unspecified osteoarthritis, unspecified site: Secondary | ICD-10-CM | POA: Diagnosis not present

## 2022-12-07 DIAGNOSIS — Z0001 Encounter for general adult medical examination with abnormal findings: Secondary | ICD-10-CM | POA: Diagnosis not present

## 2022-12-07 DIAGNOSIS — E782 Mixed hyperlipidemia: Secondary | ICD-10-CM | POA: Diagnosis not present

## 2022-12-07 DIAGNOSIS — E039 Hypothyroidism, unspecified: Secondary | ICD-10-CM | POA: Diagnosis not present

## 2022-12-07 DIAGNOSIS — J302 Other seasonal allergic rhinitis: Secondary | ICD-10-CM | POA: Diagnosis not present

## 2022-12-23 DIAGNOSIS — J449 Chronic obstructive pulmonary disease, unspecified: Secondary | ICD-10-CM | POA: Diagnosis not present

## 2023-01-02 ENCOUNTER — Telehealth: Payer: Self-pay | Admitting: Cardiovascular Disease

## 2023-01-02 DIAGNOSIS — Z95828 Presence of other vascular implants and grafts: Secondary | ICD-10-CM

## 2023-01-02 NOTE — Telephone Encounter (Signed)
Patient wants to have carotid US that is scheduled for April at [facility] to be done by Dr.Brabham at [facility]. [facility] siad since it is 3 years since they had last seen her, they need a referral.    I will message Dr.Nishan for approval.

## 2023-01-02 NOTE — Telephone Encounter (Signed)
Pt is requesting referral be sent to Dr. Trula Slade at [facility]. Please call if this can be done.

## 2023-01-04 NOTE — Telephone Encounter (Signed)
Patient notified of referral placed to [facility]. Patient had no questions or concerns at this time.

## 2023-01-09 ENCOUNTER — Ambulatory Visit (INDEPENDENT_AMBULATORY_CARE_PROVIDER_SITE_OTHER): Payer: 59 | Admitting: Pulmonary Disease

## 2023-01-09 ENCOUNTER — Encounter: Payer: Self-pay | Admitting: Pulmonary Disease

## 2023-01-09 VITALS — BP 120/60 | HR 80 | Ht 67.0 in | Wt 190.2 lb

## 2023-01-09 DIAGNOSIS — R911 Solitary pulmonary nodule: Secondary | ICD-10-CM

## 2023-01-09 DIAGNOSIS — Z87891 Personal history of nicotine dependence: Secondary | ICD-10-CM

## 2023-01-09 DIAGNOSIS — J449 Chronic obstructive pulmonary disease, unspecified: Secondary | ICD-10-CM

## 2023-01-09 NOTE — Progress Notes (Signed)
Synopsis: Referred in June 2022 for lung nodule by Celene Squibb, MD  Subjective:   PATIENT ID: Barb Merino GENDER: female DOB: 11/07/1955, MRN: [id number]  Chief Complaint  Patient presents with   Follow-up    This is a 67 year old female, past medical history of COPD, coronary artery disease, DES to mid RCA in July 2020, diabetes, reflux.Patient had lung cancer screening CT completed on 03/13/2021 this was a suspicious lung RADS 4A concerning for a irregular subpleural nodular opacity that had not been previously visualized in 2017.  There was a 10 mm nodular focus.  Patient has COPD, evidence of centrilobular emphysema on CT imaging.  Currently managed with triple therapy inhaler regimen.  Longstanding history of smoking.  She quit in the past but has restarted.  This tends to be her crutch related to stress and anxiety management.  Dealing with her 25 year old daughter that has bipolar disease and recently moved out.  She is concerned for her drug use and illicit behavior.  OV 01/09/2023: Here today for follow-up.  Last office visit recommended a 74-monthCT follow-up.  She had this done.  No office follow-up was scheduled at that time.  We recommended her starting to enrolled in lung cancer screening program in September 2023.  This was also not scheduled.  From respiratory standpoint she is doing well has no complaints.  [facility]  She stopped smoking 4 months ago.    Past Medical History:  Diagnosis Date   Anxiety    Arthritis    Asthma    Cataracts, bilateral    SGershon Crane  Complication of anesthesia    Note: pt has hx of TMJ " every now and then my jaw will lock "   COPD (chronic obstructive pulmonary disease) (HCC)    Coronary artery disease    a. s/p DES to mid-RCA in 05/2019   [place]    "came off medicines with dr's permission"   Family history of adverse reaction to anesthesia    " my son woke up crying "   Fatty liver     Fibromyalgia    GERD (gastroesophageal reflux disease)    Hyperlipidemia    stopped meds on her own   Hypothyroid    [place]    On supplemental oxygen therapy    at night and PRN   Pneumonia    Shortness of breath    Stenosis of right carotid artery    TMJ (dislocation of temporomandibular joint)      Family History  Problem Relation Age of Onset   CAD Father    Anesthesia problems Neg Hx    Hypotension Neg Hx    Malignant hyperthermia Neg Hx    Pseudochol deficiency Neg Hx      Past Surgical History:  Procedure Laterality Date   ABDOMINAL HYSTERECTOMY     BREAST SURGERY     removal of bilateral benign tumors   CARDIAC CATHETERIZATION     2010   CHOLECYSTECTOMY     CORONARY STENT INTERVENTION N/A 05/18/2019   Procedure: CORONARY STENT INTERVENTION;  Surgeon: BLorretta Harp MD;  [facility];  Service: Cardiovascular;  Laterality: N/A;   DILATION AND CURETTAGE OF UTERUS     ESOPHAGOGASTRODUODENOSCOPY  01/26/2012   Procedure: ESOPHAGOGASTRODUODENOSCOPY (EGD);  Surgeon: NRogene Houston MD;  [facility];  Service: Endoscopy;  Laterality: N/A;  730   LEFT HEART  CATH AND CORONARY ANGIOGRAPHY N/A 05/18/2019   Procedure: LEFT HEART CATH AND CORONARY ANGIOGRAPHY;  Surgeon: Lorretta Harp, MD;  Location: [facility];  Service: Cardiovascular;  Laterality: N/A;   TRANSCAROTID ARTERY REVASCULARIZATION  Right 08/02/2019   Procedure: TRANSCAROTID ARTERY REVASCULARIZATION RIGHT;  Surgeon: Serafina Mitchell, MD;  Location: [facility];  Service: Vascular;  Laterality: Right;   TUBAL LIGATION      Social History   Socioeconomic History   Marital status: Single    Spouse name: Not on file   Number of children: Not on file   Years of education: Not on file   Highest education level: Not on file  Occupational History   Not on file  Tobacco Use   Smoking status: Former    Packs/day: 0.50    Years: 40.00    Total pack years:  20.00    Types: Cigarettes, E-cigarettes    Start date: 12/2021    Quit date: 12/2021    Years since quitting: 1.0   Smokeless tobacco: Never  Vaping Use   Vaping Use: Never used  Substance and Sexual Activity   Alcohol use: No   Drug use: No    Types: Cocaine   Sexual activity: Yes    Birth control/protection: Surgical  Other Topics Concern   Not on file  Social History Narrative   Not on file   Social Determinants of Health   Financial Resource Strain: Not on file  Food Insecurity: Not on file  Transportation Needs: Not on file  Physical Activity: Not on file  Stress: Not on file  Social Connections: Not on file  Intimate Partner Violence: Not on file     No Known Allergies    Outpatient Medications Prior to Visit  Medication Sig Dispense Refill   aspirin EC 81 MG EC tablet Take 1 tablet (81 mg total) by mouth daily. 30 tablet 3   atorvastatin (LIPITOR) 80 MG tablet TAKE ONE TABLET BY MOUTH AT 6PM. (Patient taking differently: Take 80 mg by mouth in the morning.) 90 tablet 0   BREZTRI AEROSPHERE 160-9-4.8 MCG/ACT AERO Inhale 2 puffs into the lungs daily.     Cholecalciferol (VITAMIN D3) 1.25 MG (50000 UT) CAPS Take 1 capsule by mouth once a week.     guaiFENesin (MUCINEX) 600 MG 12 hr tablet Take 1,200 mg by mouth 2 (two) times daily.     hydrochlorothiazide (HYDRODIURIL) 12.5 MG tablet Take 12.5 mg by mouth daily.     levocetirizine (XYZAL) 5 MG tablet Take 5 mg by mouth daily.     LORazepam (ATIVAN) 0.5 MG tablet Take 0.5 mg by mouth 4 (four) times daily as needed.     meclizine (ANTIVERT) 25 MG tablet Take 25 mg by mouth every 6 (six) hours.     metoprolol tartrate (LOPRESSOR) 50 MG tablet Take 1 tablet (50 mg total) by mouth 2 (two) times daily. 180 tablet 3   NALTREXONE HCL PO Take 4.5 mg by mouth every morning.     potassium chloride (KLOR-CON) 10 MEQ tablet Take 1 tablet (10 mEq total) by mouth daily. 90 tablet 3   PROAIR HFA 108 (90 Base) MCG/ACT inhaler  Inhale 1 puff into the lungs every 6 (six) hours as needed for wheezing or shortness of breath.      RESTASIS 0.05 % ophthalmic emulsion 1 drop 2 (two) times daily.     thyroid (ARMOUR) 90 MG tablet Take 90 mg by mouth daily.  nitroGLYCERIN (NITROSTAT) 0.4 MG SL tablet Place 1 tablet (0.4 mg total) under the tongue every 5 (five) minutes as needed for chest pain. 25 tablet 3   No facility-administered medications prior to visit.    Review of Systems  Constitutional:  Negative for chills, fever, malaise/fatigue and weight loss.  HENT:  Negative for hearing loss, sore throat and tinnitus.   Eyes:  Negative for blurred vision and double vision.  Respiratory:  Positive for shortness of breath. Negative for cough, hemoptysis, sputum production, wheezing and stridor.   Cardiovascular:  Negative for chest pain, palpitations, orthopnea, leg swelling and PND.  Gastrointestinal:  Negative for abdominal pain, constipation, diarrhea, heartburn, nausea and vomiting.  Genitourinary:  Negative for dysuria, hematuria and urgency.  Musculoskeletal:  Negative for joint pain and myalgias.  Skin:  Negative for itching and rash.  Neurological:  Negative for dizziness, tingling, weakness and headaches.  Endo/Heme/Allergies:  Negative for environmental allergies. Does not bruise/bleed easily.  Psychiatric/Behavioral:  Negative for depression. The patient is not nervous/anxious and does not have insomnia.   All other systems reviewed and are negative.    Objective:  Physical Exam Vitals reviewed.  Constitutional:      General: She is not in acute distress.    Appearance: She is well-developed.  HENT:     Head: Normocephalic and atraumatic.  Eyes:     General: No scleral icterus.    Conjunctiva/sclera: Conjunctivae normal.     Pupils: Pupils are equal, round, and reactive to light.  Neck:     Vascular: No JVD.     Trachea: No tracheal deviation.  Cardiovascular:     Rate and Rhythm: Normal rate  and regular rhythm.     Heart sounds: Normal heart sounds. No murmur heard. Pulmonary:     Effort: Pulmonary effort is normal. No tachypnea, accessory muscle usage or respiratory distress.     Breath sounds: No stridor. No wheezing, rhonchi or rales.     Comments: Diminished breath sounds bilaterally Abdominal:     General: Bowel sounds are normal. There is no distension.     Palpations: Abdomen is soft.     Tenderness: There is no abdominal tenderness.  Musculoskeletal:        General: No tenderness.     Cervical back: Neck supple.  Lymphadenopathy:     Cervical: No cervical adenopathy.  Skin:    General: Skin is warm and dry.     Capillary Refill: Capillary refill takes less than 2 seconds.     Findings: No rash.  Neurological:     Mental Status: She is alert and oriented to person, place, and time.  Psychiatric:        Behavior: Behavior normal.      Vitals:   01/09/23 1318  BP: 120/60  Pulse: 80  SpO2: 94%  Weight: 190 lb 3.2 oz (86.3 kg)  Height: '5\' 7"'$  (1.702 m)   94% on RA BMI Readings from Last 3 Encounters:  01/09/23 29.79 kg/m  10/26/22 28.72 kg/m  04/19/22 32.44 kg/m   Wt Readings from Last 3 Encounters:  01/09/23 190 lb 3.2 oz (86.3 kg)  10/26/22 183 lb 6.4 oz (83.2 kg)  04/19/22 201 lb (91.2 kg)     CBC    Component Value Date/Time   WBC 6.1 04/12/2022 1522   RBC 4.88 04/12/2022 1522   HGB 15.2 (H) 04/12/2022 1522   HCT 45.5 04/12/2022 1522   PLT 328 04/12/2022 1522   MCV 93.2 04/12/2022 1522  MCH 31.1 04/12/2022 1522   MCHC 33.4 04/12/2022 1522   RDW 12.5 04/12/2022 1522   LYMPHSABS 2.0 05/17/2019 2337   MONOABS 0.5 05/17/2019 2337   EOSABS 0.1 05/17/2019 2337   BASOSABS 0.0 05/17/2019 2337    Chest Imaging: Lung cancer screening CT Mar 13, 2021: Right upper lobe subpleural nodular density subsolid in nature, 10 mm in largest focus area. The patient's images have been independently reviewed by me.    Pulmonary Functions Testing  Results:     No data to display          FeNO:   Pathology:   Echocardiogram:   Heart Catheterization:     Assessment & Plan:     ICD-10-CM   1. Nodule of upper lobe of right lung  R91.1 Pulmonary function test    Ambulatory Referral for Lung Cancer Scre    2. Former smoker  Z87.891 Ambulatory Referral for Lung Cancer Scre    3. Chronic obstructive pulmonary disease, unspecified COPD type ([place])  J44.9       Discussion:  This is a 67 year old female, had originally seen me for an abnormal lung cancer screening CT with a subsolid 10 mm lesion.  Short-term CT follow-up showed resolution and we will plan to reenroll her in her annual CT imaging.  This has not yet happened.  Plan: Another referral placed to the lung cancer screening program to get her worked in for her annual LDCT. Congratulated patient on quitting smoking. She may have underlying COPD.  She is currently on triple therapy inhaler regimen. Will get PFTs prior to next office visit.  RTC after this is complete COPD follow up in 1 year or as needed if symptoms arise      Current Outpatient Medications:    aspirin EC 81 MG EC tablet, Take 1 tablet (81 mg total) by mouth daily., Disp: 30 tablet, Rfl: 3   atorvastatin (LIPITOR) 80 MG tablet, TAKE ONE TABLET BY MOUTH AT 6PM. (Patient taking differently: Take 80 mg by mouth in the morning.), Disp: 90 tablet, Rfl: 0   BREZTRI AEROSPHERE 160-9-4.8 MCG/ACT AERO, Inhale 2 puffs into the lungs daily., Disp: , Rfl:    Cholecalciferol (VITAMIN D3) 1.25 MG (50000 UT) CAPS, Take 1 capsule by mouth once a week., Disp: , Rfl:    guaiFENesin (MUCINEX) 600 MG 12 hr tablet, Take 1,200 mg by mouth 2 (two) times daily., Disp: , Rfl:    hydrochlorothiazide (HYDRODIURIL) 12.5 MG tablet, Take 12.5 mg by mouth daily., Disp: , Rfl:    levocetirizine (XYZAL) 5 MG tablet, Take 5 mg by mouth daily., Disp: , Rfl:    LORazepam (ATIVAN) 0.5 MG tablet, Take 0.5 mg by mouth 4 (four) times  daily as needed., Disp: , Rfl:    meclizine (ANTIVERT) 25 MG tablet, Take 25 mg by mouth every 6 (six) hours., Disp: , Rfl:    metoprolol tartrate (LOPRESSOR) 50 MG tablet, Take 1 tablet (50 mg total) by mouth 2 (two) times daily., Disp: 180 tablet, Rfl: 3   NALTREXONE HCL PO, Take 4.5 mg by mouth every morning., Disp: , Rfl:    potassium chloride (KLOR-CON) 10 MEQ tablet, Take 1 tablet (10 mEq total) by mouth daily., Disp: 90 tablet, Rfl: 3   PROAIR HFA 108 (90 Base) MCG/ACT inhaler, Inhale 1 puff into the lungs every 6 (six) hours as needed for wheezing or shortness of breath. , Disp: , Rfl:    RESTASIS 0.05 % ophthalmic emulsion, 1 drop 2 (  two) times daily., Disp: , Rfl:    thyroid (ARMOUR) 90 MG tablet, Take 90 mg by mouth daily., Disp: , Rfl:    nitroGLYCERIN (NITROSTAT) 0.4 MG SL tablet, Place 1 tablet (0.4 mg total) under the tongue every 5 (five) minutes as needed for chest pain., Disp: 25 tablet, Rfl: 3    Garner Nash, DO [facility] 01/09/2023 1:41 PM

## 2023-01-09 NOTE — Patient Instructions (Addendum)
Thank you for visiting Dr. Valeta Harms at [facility]. Today we recommend the following:  Orders Placed This Encounter  Procedures   Ambulatory Referral for Lung Cancer Scre   Pulmonary function test   Please schedule PFTs prior to next office visit The LDCT Program will contact you with the ct results   Return in about 1 year (around 01/09/2024) for with Eric Form, NP, or Dr. Valeta Harms.    Please do your part to reduce the spread of COVID-19.

## 2023-01-10 ENCOUNTER — Other Ambulatory Visit: Payer: Self-pay | Admitting: *Deleted

## 2023-01-10 DIAGNOSIS — I6529 Occlusion and stenosis of unspecified carotid artery: Secondary | ICD-10-CM

## 2023-01-13 ENCOUNTER — Other Ambulatory Visit: Payer: Self-pay

## 2023-01-13 DIAGNOSIS — Z87891 Personal history of nicotine dependence: Secondary | ICD-10-CM

## 2023-01-19 DIAGNOSIS — L821 Other seborrheic keratosis: Secondary | ICD-10-CM | POA: Diagnosis not present

## 2023-01-19 DIAGNOSIS — X32XXXA Exposure to sunlight, initial encounter: Secondary | ICD-10-CM | POA: Diagnosis not present

## 2023-01-19 DIAGNOSIS — L820 Inflamed seborrheic keratosis: Secondary | ICD-10-CM | POA: Diagnosis not present

## 2023-01-19 DIAGNOSIS — L570 Actinic keratosis: Secondary | ICD-10-CM | POA: Diagnosis not present

## 2023-01-21 DIAGNOSIS — J449 Chronic obstructive pulmonary disease, unspecified: Secondary | ICD-10-CM | POA: Diagnosis not present

## 2023-01-23 ENCOUNTER — Ambulatory Visit (INDEPENDENT_AMBULATORY_CARE_PROVIDER_SITE_OTHER): Payer: 59 | Admitting: Surgery

## 2023-01-23 ENCOUNTER — Encounter: Payer: Self-pay | Admitting: Surgery

## 2023-01-23 ENCOUNTER — Ambulatory Visit (HOSPITAL_COMMUNITY)
Admission: RE | Admit: 2023-01-23 | Discharge: 2023-01-23 | Disposition: A | Discharge status: Home / Self Care | Payer: 59 | Source: Ambulatory Visit | Attending: Surgery | Admitting: Surgery

## 2023-01-23 VITALS — BP 118/71 | HR 61 | Temp 97.7°F | Resp 20 | Ht 67.0 in | Wt 192.0 lb

## 2023-01-23 DIAGNOSIS — I6523 Occlusion and stenosis of bilateral carotid arteries: Secondary | ICD-10-CM | POA: Diagnosis not present

## 2023-01-23 DIAGNOSIS — I6529 Occlusion and stenosis of unspecified carotid artery: Secondary | ICD-10-CM | POA: Diagnosis not present

## 2023-01-23 NOTE — Progress Notes (Signed)
[facility]  Patient name: Tabitha Schmidt MRN: [id number] DOB: November 19, 1955 Sex: female   REASON FOR VISIT:    Follow up  HISOTRY OF PRESENT ILLNESS:    Tabitha Schmidt is a 67 y.o. female with asymptomatic right carotid stenosis.  She underwent TCAR on October 2 without incident.  She was found to have a 90% lesion.  Her postoperative course was uncomplicated.  She was a high risk candidate because of recent NSTEMI with DES treatment.   She has no complaints today.  She denies any neurologic symptoms such as numbness or weakness in either extremity, slurred speech, or amaurosis fugax.  She had a ultrasound last year performed at [facility] that showed 60-79% stenosis.  She does not have claudication symptoms   The patient does take a statin for hypercholesterolemia.  She has been diagnosed with diabetes but is not on medications.  She is medically managed for hypertension.  She is a current smoker.   PAST MEDICAL HISTORY:   Past Medical History:  Diagnosis Date   Anxiety    Arthritis    Asthma    Cataracts, bilateral    Tabitha Schmidt   Complication of anesthesia    Note: pt has hx of TMJ " every now and then my jaw will lock "   COPD (chronic obstructive pulmonary disease) (HCC)    Coronary artery disease    a. s/p DES to mid-RCA in 05/2019   DM (diabetes mellitus) ([place])    "came off medicines with dr's permission"   Family history of adverse reaction to anesthesia    " my son woke up crying "   Fatty liver    Fibromyalgia    GERD (gastroesophageal reflux disease)    Hyperlipidemia    stopped meds on her own   Hypothyroid    Myocardial infarction ([place])    On supplemental oxygen therapy    at night and PRN   Pneumonia    Shortness of breath    Stenosis of right carotid artery    TMJ (dislocation of temporomandibular joint)      FAMILY HISTORY:   Family History  Problem Relation Age of Onset   CAD Father     Anesthesia problems Neg Hx    Hypotension Neg Hx    Malignant hyperthermia Neg Hx    Pseudochol deficiency Neg Hx     SOCIAL HISTORY:   Social History   Tobacco Use   Smoking status: Former    Packs/day: 0.50    Years: 40.00    Additional pack years: 0.00    Total pack years: 20.00    Types: Cigarettes, E-cigarettes    Start date: 12/2021    Quit date: 12/2021    Years since quitting: 1.0   Smokeless tobacco: Never  Substance Use Topics   Alcohol use: No     ALLERGIES:   No Known Allergies   CURRENT MEDICATIONS:   Current Outpatient Medications  Medication Sig Dispense Refill   aspirin EC 81 MG EC tablet Take 1 tablet (81 mg total) by mouth daily. 30 tablet 3   atorvastatin (LIPITOR) 80 MG tablet TAKE ONE TABLET BY MOUTH AT 6PM. (Patient taking differently: Take 80 mg by mouth in the morning.) 90 tablet 0   BREZTRI AEROSPHERE 160-9-4.8 MCG/ACT AERO Inhale 2 puffs into the lungs daily.     Cholecalciferol (VITAMIN D3) 1.25 MG (50000 UT) CAPS Take 1 capsule by mouth once a week.  guaiFENesin (MUCINEX) 600 MG 12 hr tablet Take 1,200 mg by mouth 2 (two) times daily.     hydrochlorothiazide (HYDRODIURIL) 12.5 MG tablet Take 12.5 mg by mouth daily.     levocetirizine (XYZAL) 5 MG tablet Take 5 mg by mouth daily.     LORazepam (ATIVAN) 0.5 MG tablet Take 0.5 mg by mouth 4 (four) times daily as needed.     meclizine (ANTIVERT) 25 MG tablet Take 25 mg by mouth every 6 (six) hours.     metoprolol tartrate (LOPRESSOR) 50 MG tablet Take 1 tablet (50 mg total) by mouth 2 (two) times daily. 180 tablet 3   NALTREXONE HCL PO Take 4.5 mg by mouth every morning.     potassium chloride (KLOR-CON) 10 MEQ tablet Take 1 tablet (10 mEq total) by mouth daily. 90 tablet 3   PROAIR HFA 108 (90 Base) MCG/ACT inhaler Inhale 1 puff into the lungs every 6 (six) hours as needed for wheezing or shortness of breath.      RESTASIS 0.05 % ophthalmic emulsion 1 drop 2 (two) times daily.      thyroid (ARMOUR) 90 MG tablet Take 90 mg by mouth daily.     nitroGLYCERIN (NITROSTAT) 0.4 MG SL tablet Place 1 tablet (0.4 mg total) under the tongue every 5 (five) minutes as needed for chest pain. 25 tablet 3   No current facility-administered medications for this visit.    REVIEW OF SYSTEMS:   [X]  denotes positive finding, [ ]  denotes negative finding Cardiac  Comments:  Chest pain or chest pressure:    Shortness of breath upon exertion:    Short of breath when lying flat:    Irregular heart rhythm:        Vascular    Pain in calf, thigh, or hip brought on by ambulation:    Pain in feet at night that wakes you up from your sleep:     Blood clot in your veins:    Leg swelling:         Pulmonary    Oxygen at home:    Productive cough:     Wheezing:         Neurologic    Sudden weakness in arms or legs:     Sudden numbness in arms or legs:     Sudden onset of difficulty speaking or slurred speech:    Temporary loss of vision in one eye:     Problems with dizziness:         Gastrointestinal    Blood in stool:     Vomited blood:         Genitourinary    Burning when urinating:     Blood in urine:        Psychiatric    Major depression:         Hematologic    Bleeding problems:    Problems with blood clotting too easily:        Skin    Rashes or ulcers:        Constitutional    Fever or chills:      PHYSICAL EXAM:   Vitals:   01/23/23 1332 01/23/23 1334  BP: 124/76 118/71  Pulse: 61   Resp: 20   Temp: 97.7 F (36.5 C)   SpO2: 94%   Weight: 192 lb (87.1 kg)   Height: 5\' 7"  (1.702 m)     GENERAL: The patient is a well-nourished female, in no acute distress. The vital signs  are documented above. CARDIAC: There is a regular rate and rhythm.  VASCULAR: Palpable bilateral radial and bilateral pedal pulses PULMONARY: Non-labored respirations MUSCULOSKELETAL: There are no major deformities or cyanosis. NEUROLOGIC: No focal weakness or paresthesias are  detected. SKIN: There are no ulcers or rashes noted. PSYCHIATRIC: The patient has a normal affect.  STUDIES:   I have reviewed her ultrasound with the following findings: Right Carotid: The ECA appears >50% stenosed. Patent CCA/ICA stent with no                 stenosis.   Left Carotid: Velocities in the left ICA are consistent with a 1-39%  stenosis.               The ECA appears >50% stenosed.   Vertebrals:  Bilateral vertebral arteries demonstrate antegrade flow.  Subclavians: Normal flow hemodynamics were seen in bilateral subclavian               arteries.   MEDICAL ISSUES:   Carotid: Elevated velocities could not be replicated on today's study.  She remains asymptomatic.  Her stent is widely patent on the right side.  I will have her follow-up in 1 year with repeat ultrasound.    Leia Alf, MD, FACS [facility] [phone] Pager [phone]

## 2023-01-28 ENCOUNTER — Other Ambulatory Visit: Payer: Self-pay | Admitting: Physician Assistant

## 2023-01-31 ENCOUNTER — Ambulatory Visit (INDEPENDENT_AMBULATORY_CARE_PROVIDER_SITE_OTHER): Payer: 59 | Admitting: Pulmonary Disease

## 2023-01-31 DIAGNOSIS — R911 Solitary pulmonary nodule: Secondary | ICD-10-CM

## 2023-01-31 LAB — PULMONARY FUNCTION TEST
DL/VA % pred: 66 %
DL/VA: 2.68 ml/min/mmHg/L
DLCO cor % pred: 65 %
DLCO cor: 14.04 ml/min/mmHg
DLCO unc % pred: 65 %
DLCO unc: 14.04 ml/min/mmHg
FEF 25-75 Post: 1.39 L/sec
FEF 25-75 Pre: 0.73 L/sec
FEF2575-%Change-Post: 90 %
FEF2575-%Pred-Post: 65 %
FEF2575-%Pred-Pre: 34 %
FEV1-%Change-Post: 22 %
FEV1-%Pred-Post: 67 %
FEV1-%Pred-Pre: 55 %
FEV1-Post: 1.76 L
FEV1-Pre: 1.43 L
FEV1FVC-%Change-Post: -1 %
FEV1FVC-%Pred-Pre: 76 %
FEV6-%Change-Post: 23 %
FEV6-%Pred-Post: 90 %
FEV6-%Pred-Pre: 73 %
FEV6-Post: 2.95 L
FEV6-Pre: 2.39 L
FEV6FVC-%Change-Post: 0 %
FEV6FVC-%Pred-Post: 100 %
FEV6FVC-%Pred-Pre: 101 %
FVC-%Change-Post: 24 %
FVC-%Pred-Post: 89 %
FVC-%Pred-Pre: 72 %
FVC-Post: 3.06 L
FVC-Pre: 2.45 L
Post FEV1/FVC ratio: 57 %
Post FEV6/FVC ratio: 96 %
Pre FEV1/FVC ratio: 58 %
Pre FEV6/FVC Ratio: 97 %
RV % pred: 196 %
RV: 4.55 L
TLC % pred: 129 %
TLC: 7.13 L

## 2023-01-31 NOTE — Patient Instructions (Signed)
Full PFT performed today. °

## 2023-01-31 NOTE — Progress Notes (Signed)
Full PFT performed today. °

## 2023-02-21 DIAGNOSIS — J449 Chronic obstructive pulmonary disease, unspecified: Secondary | ICD-10-CM | POA: Diagnosis not present

## 2023-02-24 ENCOUNTER — Other Ambulatory Visit (HOSPITAL_COMMUNITY): Payer: Medicare Other

## 2023-03-07 ENCOUNTER — Encounter: Payer: Self-pay | Admitting: Acute Care

## 2023-03-07 ENCOUNTER — Ambulatory Visit (INDEPENDENT_AMBULATORY_CARE_PROVIDER_SITE_OTHER): Payer: 59 | Admitting: Acute Care

## 2023-03-07 DIAGNOSIS — Z87891 Personal history of nicotine dependence: Secondary | ICD-10-CM | POA: Diagnosis not present

## 2023-03-07 NOTE — Progress Notes (Signed)
Virtual Visit via Telephone Note  I connected with Tabitha Schmidt on 03/07/23 at  2:00 PM EDT by telephone and verified that I am speaking with the correct person using two identifiers.  Location: Patient:  At home  Provider: [address], [place], [place], [address]   I discussed the limitations, risks, security and privacy concerns of performing an evaluation and management service by telephone and the availability of in person appointments. I also discussed with the patient that there may be a patient responsible charge related to this service. The patient expressed understanding and agreed to proceed.   Shared Decision Making Visit Lung Cancer Screening Program [phone])   Eligibility: Age 67 y.o. Pack Years Smoking History Calculation 41 pack year smoking history (# packs/per year x # years smoked) Recent History of coughing up blood  no Unexplained weight loss? no ( >Than 15 pounds within the last 6 months ) Prior History Lung / other cancer no (Diagnosis within the last 5 years already requiring surveillance chest CT Scans). Smoking Status Former Smoker Former Smokers: Years since quit: < 1 year  Quit Date: 08/2022  Visit Components: Discussion included one or more decision making aids. yes Discussion included risk/benefits of screening. yes Discussion included potential follow up diagnostic testing for abnormal scans. yes Discussion included meaning and risk of over diagnosis. yes Discussion included meaning and risk of False Positives. yes Discussion included meaning of total radiation exposure. yes  Counseling Included: Importance of adherence to annual lung cancer LDCT screening. yes Impact of comorbidities on ability to participate in the program. yes Ability and willingness to under diagnostic treatment. yes  Smoking Cessation Counseling: Current Smokers:  Discussed importance of smoking cessation. yes Information about tobacco cessation classes and  interventions provided to patient. yes Patient provided with "ticket" for LDCT Scan. yes Symptomatic Patient. no  Counseling NA Diagnosis Code: Tobacco Use Z72.0 Asymptomatic Patient yes  Counseling (Intermediate counseling: > three minutes counseling) B7169 Former Smokers:  Discussed the importance of maintaining cigarette abstinence. yes Diagnosis Code: Personal History of Nicotine Dependence. C78.938 Information about tobacco cessation classes and interventions provided to patient. Yes Patient provided with "ticket" for LDCT Scan. yes Written Order for Lung Cancer Screening with LDCT placed in Epic. Yes (CT Chest Lung Cancer Screening Low Dose W/O CM) [id number] Z12.2-Screening of respiratory organs Z87.891-Personal history of nicotine dependence  I spent 25 minutes of face to face time/virtual visit time  with  Tabitha Schmidt discussing the risks and benefits of lung cancer screening. We took the time to pause the power point at intervals to allow for questions to be asked and answered to ensure understanding. We discussed that she had taken the single most powerful action possible to decrease her risk of developing lung cancer when she quit smoking. I counseled her to remain smoke free, and to contact me if she ever had the desire to smoke again so that I can provide resources and tools to help support the effort to remain smoke free. We discussed the time and location of the scan, and that either  Tabitha Miyamoto RN, Karlton Lemon, RN or I  or I will call / send a letter with the results within  24-72 hours of receiving them. She has the office contact information in the event she needs to speak with me,  she verbalized understanding of all of the above and had no further questions upon leaving the office.     I explained to the patient that there  has been a high incidence of coronary artery disease noted on these exams. I explained that this is a non-gated exam therefore degree or severity cannot  be determined. This patient is on statin therapy. I have asked the patient to follow-up with their PCP regarding any incidental finding of coronary artery disease and management with diet or medication as they feel is clinically indicated. The patient verbalized understanding of the above and had no further questions.     Bevelyn Ngo, NP 03/07/2023

## 2023-03-07 NOTE — Patient Instructions (Signed)
Thank you for participating in the [facility]. It was our pleasure to meet you today. We will call you with the results of your scan within the next few days. Your scan will be assigned a Lung RADS category score by the physicians reading the scans.  This Lung RADS score determines follow up scanning.  See below for description of categories, and follow up screening recommendations. We will be in touch to schedule your follow up screening annually or based on recommendations of our providers. We will fax a copy of your scan results to your Primary Care Physician, or the physician who referred you to the program, to ensure they have the results. Please call the office if you have any questions or concerns regarding your scanning experience or results.  Our office number is [phone]. Please speak with Denise Phelps, RN. , or  Denise Buckner RN, They are  our Lung Cancer Screening RN.'s If They are unavailable when you call, Please leave a message on the voice mail. We will return your call at our earliest convenience.This voice mail is monitored several times a day.  Remember, if your scan is normal, we will scan you annually as long as you continue to meet the criteria for the program. (Age 50-80, Current smoker or smoker who has quit within the last 15 years). If you are a smoker, remember, quitting is the single most powerful action that you can take to decrease your risk of lung cancer and other pulmonary, breathing related problems. We know quitting is hard, and we are here to help.  Please let us know if there is anything we can do to help you meet your goal of quitting. If you are a former smoker, congratulations. We are proud of you! Remain smoke free! Remember you can refer friends or family members through the number above.  We will screen them to make sure they meet criteria for the program. Thank you for helping us take better care of you by  participating in Lung Screening.  You can receive free nicotine replacement therapy ( patches, gum or mints) by calling 1-800-QUIT NOW. Please call so we can get you on the path to becoming  a non-smoker. I know it is hard, but you can do this!  Lung RADS Categories:  Lung RADS 1: no nodules or definitely non-concerning nodules.  Recommendation is for a repeat annual scan in 12 months.  Lung RADS 2:  nodules that are non-concerning in appearance and behavior with a very low likelihood of becoming an active cancer. Recommendation is for a repeat annual scan in 12 months.  Lung RADS 3: nodules that are probably non-concerning , includes nodules with a low likelihood of becoming an active cancer.  Recommendation is for a 6-month repeat screening scan. Often noted after an upper respiratory illness. We will be in touch to make sure you have no questions, and to schedule your 6-month scan.  Lung RADS 4 A: nodules with concerning findings, recommendation is most often for a follow up scan in 3 months or additional testing based on our provider's assessment of the scan. We will be in touch to make sure you have no questions and to schedule the recommended 3 month follow up scan.  Lung RADS 4 B:  indicates findings that are concerning. We will be in touch with you to schedule additional diagnostic testing based on our provider's  assessment of the scan.  Other options for assistance in smoking cessation (   As covered by your insurance benefits)  Hypnosis for smoking cessation  [employer] [phone]  Acupuncture for smoking cessation  [facility] [phone]   

## 2023-03-10 ENCOUNTER — Ambulatory Visit (HOSPITAL_COMMUNITY)
Admission: RE | Admit: 2023-03-10 | Discharge: 2023-03-10 | Disposition: A | Discharge status: Home / Self Care | Payer: 59 | Source: Ambulatory Visit | Attending: Family Medicine | Admitting: Family Medicine

## 2023-03-10 DIAGNOSIS — Z87891 Personal history of nicotine dependence: Secondary | ICD-10-CM

## 2023-03-14 ENCOUNTER — Other Ambulatory Visit: Payer: Self-pay | Admitting: Acute Care

## 2023-03-14 DIAGNOSIS — Z122 Encounter for screening for malignant neoplasm of respiratory organs: Secondary | ICD-10-CM

## 2023-03-14 DIAGNOSIS — Z87891 Personal history of nicotine dependence: Secondary | ICD-10-CM

## 2023-03-23 DIAGNOSIS — J449 Chronic obstructive pulmonary disease, unspecified: Secondary | ICD-10-CM | POA: Diagnosis not present

## 2023-04-15 NOTE — Progress Notes (Signed)
CARDIOLOGY CONSULT NOTE       Patient ID: Tabitha Schmidt MRN: [id number] DOB/AGE: May 05, 1956 67 y.o.  Admit date: (Not on file) Referring Physician: [name] Primary Physician: [name], MD Primary Cardiologist: [name] -> [name] F/U  Palpitations   HPI:  67 y.o. referred by Dr [name] for palpitations 02/10/21 . She has been followed in past by [facility]. She has a history of right carotid stenting Known CAD with cath 05/18/19 for NSTEMI showing occluded mid/distal RCA stented with good results and no disease in left system EF 50-55% Last carotid duplex 01/2023  with patent right stent and 1-39 % Left ICA stenosis patent stent in RICA   She is still smoking  I see her son She sees endocrine for hypothyroidism on replacement Her TSH has been suppressed repeatedly over the last year   She has an 30 yo grand daughter "adopted" living with her with severe mental health issues  Depression bipolar suicidal at times   Stopped smoking 08/2022 Lung cancer CT 03/13/21 suspicious subpleural nodular opacity in posterior apical RUL Consider PET/ CT in 3 months Repeat CT stable 03/14/23  and seeing Dr [name] in pulmonary F/U CT 03/10/23 stable   Seen in ED 01/07/22 with dyspnea COVID positive Given Paxlovid She has had vaccines and booster COPD on oxygen at night 2L's Rx steroids/ nebs CXR NAD just chronic emphysema BNP only 124  Has not smoked using Chantix No TIA symptoms no angina Compliant with meds   No angina has cats/dogs at home but doesn't travel much   ROS All other systems reviewed and negative except as noted above  Past Medical History:  Diagnosis Date   Anxiety    Arthritis    Asthma    Cataracts, bilateral    [name]   Complication of anesthesia    Note: pt has hx of TMJ " every now and then my jaw will lock "   COPD (chronic obstructive pulmonary disease) (HCC)    Coronary artery disease    a. s/p DES to mid-RCA in 05/2019   DM (diabetes mellitus) (HCC)    "came off medicines with dr's  permission"   Family history of adverse reaction to anesthesia    " my son woke up crying "   Fatty liver    Fibromyalgia    GERD (gastroesophageal reflux disease)    Hyperlipidemia    stopped meds on her own   Hypothyroid    Myocardial infarction (HCC)    On supplemental oxygen therapy    at night and PRN   Pneumonia    Shortness of breath    Stenosis of right carotid artery    TMJ (dislocation of temporomandibular joint)     Family History  Problem Relation Age of Onset   CAD Father    Anesthesia problems Neg Hx    Hypotension Neg Hx    Malignant hyperthermia Neg Hx    Pseudochol deficiency Neg Hx     Social History   Socioeconomic History   Marital status: Single    Spouse name: Not on file   Number of children: Not on file   Years of education: Not on file   Highest education level: Not on file  Occupational History   Not on file  Tobacco Use   Smoking status: Former    Packs/day: 0.75    Years: 55.00    Additional pack years: 0.00    Total pack years: 41.25    Types: Cigarettes, E-cigarettes  Start date: 12/2021    Quit date: 08/2022    Years since quitting: 0.6   Smokeless tobacco: Never  Vaping Use   Vaping Use: Never used  Substance and Sexual Activity   Alcohol use: No   Drug use: No    Types: Cocaine   Sexual activity: Yes    Birth control/protection: Surgical  Other Topics Concern   Not on file  Social History Narrative   Not on file   Social Determinants of Health   Financial Resource Strain: Not on file  Food Insecurity: Not on file  Transportation Needs: Not on file  Physical Activity: Not on file  Stress: Not on file  Social Connections: Not on file  Intimate Partner Violence: Not on file    Past Surgical History:  Procedure Laterality Date   ABDOMINAL HYSTERECTOMY     BREAST SURGERY     removal of bilateral benign tumors   CARDIAC CATHETERIZATION     2010   CHOLECYSTECTOMY     CORONARY STENT INTERVENTION N/A 05/18/2019    Procedure: CORONARY STENT INTERVENTION;  Surgeon: Runell Gess, MD;  Location: [facility];  Service: Cardiovascular;  Laterality: N/A;   DILATION AND CURETTAGE OF UTERUS     ESOPHAGOGASTRODUODENOSCOPY  01/26/2012   Procedure: ESOPHAGOGASTRODUODENOSCOPY (EGD);  Surgeon: Malissa Hippo, MD;  Location: [facility];  Service: Endoscopy;  Laterality: N/A;  730   LEFT HEART CATH AND CORONARY ANGIOGRAPHY N/A 05/18/2019   Procedure: LEFT HEART CATH AND CORONARY ANGIOGRAPHY;  Surgeon: Runell Gess, MD;  Location: [facility];  Service: Cardiovascular;  Laterality: N/A;   TRANSCAROTID ARTERY REVASCULARIZATION  Right 08/02/2019   Procedure: TRANSCAROTID ARTERY REVASCULARIZATION RIGHT;  Surgeon: Nada Libman, MD;  Location: [facility] OR;  Service: Vascular;  Laterality: Right;   TUBAL LIGATION        Current Outpatient Medications:    aspirin EC 81 MG EC tablet, Take 1 tablet (81 mg total) by mouth daily., Disp: 30 tablet, Rfl: 3   atorvastatin (LIPITOR) 80 MG tablet, TAKE ONE TABLET BY MOUTH AT 6PM. (Patient taking differently: Take 80 mg by mouth in the morning.), Disp: 90 tablet, Rfl: 0   BREZTRI AEROSPHERE 160-9-4.8 MCG/ACT AERO, Inhale 2 puffs into the lungs daily., Disp: , Rfl:    Cholecalciferol (VITAMIN D3) 1.25 MG (50000 UT) CAPS, Take 1 capsule by mouth once a week., Disp: , Rfl:    guaiFENesin (MUCINEX) 600 MG 12 hr tablet, Take 1,200 mg by mouth 2 (two) times daily., Disp: , Rfl:    hydrochlorothiazide (HYDRODIURIL) 12.5 MG tablet, Take 12.5 mg by mouth daily., Disp: , Rfl:    LORazepam (ATIVAN) 0.5 MG tablet, Take 0.5 mg by mouth 4 (four) times daily as needed., Disp: , Rfl:    meclizine (ANTIVERT) 25 MG tablet, Take 25 mg by mouth every 6 (six) hours., Disp: , Rfl:    metoprolol tartrate (LOPRESSOR) 50 MG tablet, Take 1 tablet (50 mg total) by mouth 2 (two) times daily., Disp: 180 tablet, Rfl: 3   NALTREXONE HCL PO, Take 4.5 mg by mouth every morning., Disp: , Rfl:     nitroGLYCERIN (NITROSTAT) 0.4 MG SL tablet, Place 1 tablet (0.4 mg total) under the tongue every 5 (five) minutes as needed for chest pain., Disp: 25 tablet, Rfl: 3   potassium chloride (KLOR-CON) 10 MEQ tablet, TAKE ONE TABLET BY MOUTH ONCE DAILY., Disp: 90 tablet, Rfl: 0   PROAIR HFA 108 (90 Base) MCG/ACT inhaler,  Inhale 1 puff into the lungs every 6 (six) hours as needed for wheezing or shortness of breath. , Disp: , Rfl:    RESTASIS 0.05 % ophthalmic emulsion, 1 drop 2 (two) times daily., Disp: , Rfl:    thyroid (ARMOUR) 90 MG tablet, Take 90 mg by mouth daily., Disp: , Rfl:    varenicline (CHANTIX) 1 MG tablet, Take 1 mg by mouth 2 (two) times daily., Disp: , Rfl:    levocetirizine (XYZAL) 5 MG tablet, Take 5 mg by mouth daily., Disp: , Rfl:     Physical Exam: Blood pressure 116/60, pulse 67, height 5\' 7"  (1.702 m), weight 203 lb (92.1 kg), SpO2 96 %.    Affect appropriate Healthy:  appears stated age HEENT: normal Neck supple with no adenopathy JVP normal no bruits no thyromegaly Lungs clear with no wheezing and good diaphragmatic motion Heart:  S1/S2 no murmur, no rub, gallop or click PMI normal Abdomen: benighn, BS positve, no tenderness, no AAA no bruit.  No HSM or HJR Distal pulses intact with no bruits No edema Neuro non-focal Skin warm and dry No muscular weakness   Labs:   Lab Results  Component Value Date   WBC 6.1 04/12/2022   HGB 15.2 (H) 04/12/2022   HCT 45.5 04/12/2022   MCV 93.2 04/12/2022   PLT 328 04/12/2022   No results for input(s): "NA", "K", "CL", "CO2", "BUN", "CREATININE", "CALCIUM", "PROT", "BILITOT", "ALKPHOS", "ALT", "AST", "GLUCOSE" in the last 168 hours.  Invalid input(s): "LABALBU" No results found for: "CKTOTAL", "CKMB", "CKMBINDEX", "TROPONINI"  Lab Results  Component Value Date   CHOL 181 05/14/2019   Lab Results  Component Value Date   HDL 35 (L) 05/14/2019   Lab Results  Component Value Date   LDLCALC 113 (H) 05/14/2019    Lab Results  Component Value Date   TRIG 167 (H) 05/14/2019   Lab Results  Component Value Date   CHOLHDL 5.2 05/14/2019   No results found for: "LDLDIRECT"    Radiology: No results found.  EKG: 04/28/2023 SR rate 67 normal    ASSESSMENT AND PLAN:   1. CAD:  Stenting of occluded mid/distal RCA July 2020 Continue ASA/statin/beta blocker No residual left sided disease no need for stress testing now  2. HLD on statin labs with primary  3. Smoking:  Counseled on cessation has done well with Chantix F/U with Dr [name] in pulmonary  4. PVD: post stenting right ICA Duplex 01/23/23 patent with no obstructive ICS dx bilaterally F/U Dr Myra Gianotti  5. Thyroid:  TSH with primary has been suppressed no recent labs in Epic 6. Palpitations: benign monitor 05/12/22 max 15 beats artrial tachycardia and < 1% PAC/PVCls continue lopressor   Carotid duplex April 2025  F/U in  6 months    Signed: Charlton Haws 04/28/2023, 8:55 AM

## 2023-04-23 DIAGNOSIS — J449 Chronic obstructive pulmonary disease, unspecified: Secondary | ICD-10-CM | POA: Diagnosis not present

## 2023-04-28 ENCOUNTER — Ambulatory Visit: Payer: 59 | Attending: Cardiovascular Disease | Admitting: Cardiovascular Disease

## 2023-04-28 ENCOUNTER — Encounter: Payer: Self-pay | Admitting: Cardiovascular Disease

## 2023-04-28 VITALS — BP 116/60 | HR 67 | Ht 67.0 in | Wt 203.0 lb

## 2023-04-28 DIAGNOSIS — E785 Hyperlipidemia, unspecified: Secondary | ICD-10-CM

## 2023-04-28 DIAGNOSIS — R002 Palpitations: Secondary | ICD-10-CM

## 2023-04-28 DIAGNOSIS — I2510 Atherosclerotic heart disease of native coronary artery without angina pectoris: Secondary | ICD-10-CM

## 2023-04-28 DIAGNOSIS — Z95828 Presence of other vascular implants and grafts: Secondary | ICD-10-CM | POA: Diagnosis not present

## 2023-04-28 NOTE — Patient Instructions (Signed)
Medication Instructions:  Your physician recommends that you continue on your current medications as directed. Please refer to the Current Medication list given to you today.   Labwork: None today  Testing/Procedures: None today  Follow-Up: 1 year  Any Other Special Instructions Will Be Listed Below (If Applicable).  If you need a refill on your cardiac medications before your next appointment, please call your pharmacy.  

## 2023-05-01 DIAGNOSIS — M545 Low back pain, unspecified: Secondary | ICD-10-CM | POA: Diagnosis not present

## 2023-05-23 DIAGNOSIS — J449 Chronic obstructive pulmonary disease, unspecified: Secondary | ICD-10-CM | POA: Diagnosis not present

## 2023-06-01 DIAGNOSIS — E559 Vitamin D deficiency, unspecified: Secondary | ICD-10-CM | POA: Diagnosis not present

## 2023-06-01 DIAGNOSIS — R7303 Prediabetes: Secondary | ICD-10-CM | POA: Diagnosis not present

## 2023-06-01 DIAGNOSIS — I10 Essential (primary) hypertension: Secondary | ICD-10-CM | POA: Diagnosis not present

## 2023-06-01 DIAGNOSIS — E039 Hypothyroidism, unspecified: Secondary | ICD-10-CM | POA: Diagnosis not present

## 2023-06-01 DIAGNOSIS — E782 Mixed hyperlipidemia: Secondary | ICD-10-CM | POA: Diagnosis not present

## 2023-06-07 DIAGNOSIS — E1165 Type 2 diabetes mellitus with hyperglycemia: Secondary | ICD-10-CM | POA: Diagnosis not present

## 2023-06-07 DIAGNOSIS — E039 Hypothyroidism, unspecified: Secondary | ICD-10-CM | POA: Diagnosis not present

## 2023-06-07 DIAGNOSIS — J449 Chronic obstructive pulmonary disease, unspecified: Secondary | ICD-10-CM | POA: Diagnosis not present

## 2023-06-07 DIAGNOSIS — I2510 Atherosclerotic heart disease of native coronary artery without angina pectoris: Secondary | ICD-10-CM | POA: Diagnosis not present

## 2023-06-07 DIAGNOSIS — E538 Deficiency of other specified B group vitamins: Secondary | ICD-10-CM | POA: Diagnosis not present

## 2023-06-07 DIAGNOSIS — M1990 Unspecified osteoarthritis, unspecified site: Secondary | ICD-10-CM | POA: Diagnosis not present

## 2023-06-07 DIAGNOSIS — F17201 Nicotine dependence, unspecified, in remission: Secondary | ICD-10-CM | POA: Diagnosis not present

## 2023-06-07 DIAGNOSIS — E782 Mixed hyperlipidemia: Secondary | ICD-10-CM | POA: Diagnosis not present

## 2023-06-07 DIAGNOSIS — K219 Gastro-esophageal reflux disease without esophagitis: Secondary | ICD-10-CM | POA: Diagnosis not present

## 2023-06-07 DIAGNOSIS — E559 Vitamin D deficiency, unspecified: Secondary | ICD-10-CM | POA: Diagnosis not present

## 2023-06-07 DIAGNOSIS — I10 Essential (primary) hypertension: Secondary | ICD-10-CM | POA: Diagnosis not present

## 2023-06-08 ENCOUNTER — Telehealth: Payer: Self-pay | Admitting: Cardiovascular Disease

## 2023-06-08 MED ORDER — ATORVASTATIN CALCIUM 80 MG PO TABS: ORAL_TABLET | ORAL | 3 refills | Status: DC

## 2023-06-08 MED ORDER — METOPROLOL TARTRATE 50 MG PO TABS: 50.0000 mg | ORAL_TABLET | Freq: Two times a day (BID) | ORAL | 3 refills | Status: DC

## 2023-06-08 NOTE — Telephone Encounter (Signed)
Refill complete 

## 2023-06-08 NOTE — Addendum Note (Signed)
Addended by: Kerney Elbe on: 06/08/2023 11:31 AM   Modules accepted: Orders

## 2023-06-08 NOTE — Telephone Encounter (Signed)
*  STAT* If patient is at the pharmacy, call can be transferred to refill team.   1. Which medications need to be refilled? (please list name of each medication and dose if known) atorvastatin (LIPITOR) 80 MG tablet metoprolol tartrate (LOPRESSOR) 50 MG tablet  2. Which pharmacy/location (including street and city if local pharmacy) is medication to be sent to? [facility] - [place], [place] - [address]    3. Do they need a 30 day or 90 day supply?  90 day supply

## 2023-06-23 DIAGNOSIS — J449 Chronic obstructive pulmonary disease, unspecified: Secondary | ICD-10-CM | POA: Diagnosis not present

## 2023-07-13 ENCOUNTER — Telehealth: Payer: Self-pay | Admitting: *Deleted

## 2023-07-13 NOTE — Progress Notes (Signed)
Care Coordination  Outreach Note  07/13/2023 Name: Tabitha Schmidt MRN: [id number] DOB: September 23, 1956   Care Coordination Outreach Attempts: An unsuccessful telephone outreach was attempted today to offer the patient information about available care coordination services.  Follow Up Plan:  Additional outreach attempts will be made to offer the patient care coordination information and services.   Encounter Outcome:  No Answer  Gwenevere Ghazi  Care Coordination Care Guide  Direct Dial: [phone]

## 2023-07-18 NOTE — Progress Notes (Signed)
Care Coordination  Outreach Note  07/18/2023 Name: AAMINA BEAUDOIN MRN: [id number] DOB: [birth year]   Care Coordination Outreach Attempts: A second unsuccessful outreach was attempted today to offer the patient with information about available care coordination services.  Follow Up Plan:  Additional outreach attempts will be made to offer the patient care coordination information and services.   Encounter Outcome:  No Answer  Gwenevere Ghazi  Care Coordination Care Guide  Direct Dial: [phone]

## 2023-07-24 DIAGNOSIS — J449 Chronic obstructive pulmonary disease, unspecified: Secondary | ICD-10-CM | POA: Diagnosis not present

## 2023-07-24 NOTE — Progress Notes (Signed)
Care Coordination  Outreach Note  07/24/2023 Name: TASSY JASON MRN: [id number] DOB: [birth year]   Care Coordination Outreach Attempts: A third unsuccessful outreach was attempted today to offer the patient with information about available care coordination services.  Follow Up Plan:  No further outreach attempts will be made at this time. We have been unable to contact the patient to offer or enroll patient in care coordination services  Encounter Outcome:  No Answer  Gwenevere Ghazi  Care Coordination Care Guide  Direct Dial: [phone]

## 2023-08-08 ENCOUNTER — Other Ambulatory Visit: Payer: Self-pay | Admitting: Physician Assistant

## 2023-08-23 DIAGNOSIS — J449 Chronic obstructive pulmonary disease, unspecified: Secondary | ICD-10-CM | POA: Diagnosis not present

## 2023-09-13 DIAGNOSIS — E538 Deficiency of other specified B group vitamins: Secondary | ICD-10-CM | POA: Diagnosis not present

## 2023-09-13 DIAGNOSIS — E559 Vitamin D deficiency, unspecified: Secondary | ICD-10-CM | POA: Diagnosis not present

## 2023-09-13 DIAGNOSIS — E1165 Type 2 diabetes mellitus with hyperglycemia: Secondary | ICD-10-CM | POA: Diagnosis not present

## 2023-09-13 DIAGNOSIS — E782 Mixed hyperlipidemia: Secondary | ICD-10-CM | POA: Diagnosis not present

## 2023-09-13 DIAGNOSIS — I10 Essential (primary) hypertension: Secondary | ICD-10-CM | POA: Diagnosis not present

## 2023-09-19 DIAGNOSIS — J449 Chronic obstructive pulmonary disease, unspecified: Secondary | ICD-10-CM | POA: Diagnosis not present

## 2023-09-19 DIAGNOSIS — I10 Essential (primary) hypertension: Secondary | ICD-10-CM | POA: Diagnosis not present

## 2023-09-19 DIAGNOSIS — E559 Vitamin D deficiency, unspecified: Secondary | ICD-10-CM | POA: Diagnosis not present

## 2023-09-19 DIAGNOSIS — E1165 Type 2 diabetes mellitus with hyperglycemia: Secondary | ICD-10-CM | POA: Diagnosis not present

## 2023-09-19 DIAGNOSIS — J302 Other seasonal allergic rhinitis: Secondary | ICD-10-CM | POA: Diagnosis not present

## 2023-09-19 DIAGNOSIS — K219 Gastro-esophageal reflux disease without esophagitis: Secondary | ICD-10-CM | POA: Diagnosis not present

## 2023-09-19 DIAGNOSIS — E538 Deficiency of other specified B group vitamins: Secondary | ICD-10-CM | POA: Diagnosis not present

## 2023-09-19 DIAGNOSIS — I2510 Atherosclerotic heart disease of native coronary artery without angina pectoris: Secondary | ICD-10-CM | POA: Diagnosis not present

## 2023-09-19 DIAGNOSIS — E782 Mixed hyperlipidemia: Secondary | ICD-10-CM | POA: Diagnosis not present

## 2023-09-19 DIAGNOSIS — E039 Hypothyroidism, unspecified: Secondary | ICD-10-CM | POA: Diagnosis not present

## 2023-09-19 DIAGNOSIS — F17201 Nicotine dependence, unspecified, in remission: Secondary | ICD-10-CM | POA: Diagnosis not present

## 2023-09-23 DIAGNOSIS — J449 Chronic obstructive pulmonary disease, unspecified: Secondary | ICD-10-CM | POA: Diagnosis not present

## 2023-10-23 DIAGNOSIS — F17210 Nicotine dependence, cigarettes, uncomplicated: Secondary | ICD-10-CM | POA: Diagnosis not present

## 2023-10-23 DIAGNOSIS — J441 Chronic obstructive pulmonary disease with (acute) exacerbation: Secondary | ICD-10-CM | POA: Diagnosis not present

## 2023-10-23 DIAGNOSIS — J449 Chronic obstructive pulmonary disease, unspecified: Secondary | ICD-10-CM | POA: Diagnosis not present

## 2023-10-27 ENCOUNTER — Other Ambulatory Visit: Payer: Self-pay

## 2023-10-27 MED ORDER — POTASSIUM CHLORIDE ER 10 MEQ PO TBCR: 10.0000 meq | EXTENDED_RELEASE_TABLET | Freq: Every day | ORAL | 1 refills | Status: DC

## 2023-11-19 DIAGNOSIS — Z1211 Encounter for screening for malignant neoplasm of colon: Secondary | ICD-10-CM | POA: Diagnosis not present

## 2023-11-23 DIAGNOSIS — J449 Chronic obstructive pulmonary disease, unspecified: Secondary | ICD-10-CM | POA: Diagnosis not present

## 2023-11-28 ENCOUNTER — Other Ambulatory Visit (HOSPITAL_COMMUNITY): Payer: Self-pay | Admitting: Family Medicine

## 2023-11-28 DIAGNOSIS — F17210 Nicotine dependence, cigarettes, uncomplicated: Secondary | ICD-10-CM | POA: Diagnosis not present

## 2023-11-28 DIAGNOSIS — Z7951 Long term (current) use of inhaled steroids: Secondary | ICD-10-CM | POA: Diagnosis not present

## 2023-11-28 DIAGNOSIS — J449 Chronic obstructive pulmonary disease, unspecified: Secondary | ICD-10-CM | POA: Diagnosis not present

## 2023-11-28 DIAGNOSIS — Z1211 Encounter for screening for malignant neoplasm of colon: Secondary | ICD-10-CM | POA: Diagnosis not present

## 2023-11-28 DIAGNOSIS — R1900 Intra-abdominal and pelvic swelling, mass and lump, unspecified site: Secondary | ICD-10-CM | POA: Diagnosis not present

## 2023-11-28 DIAGNOSIS — Z7952 Long term (current) use of systemic steroids: Secondary | ICD-10-CM | POA: Diagnosis not present

## 2023-12-01 ENCOUNTER — Ambulatory Visit (HOSPITAL_COMMUNITY)
Admission: RE | Admit: 2023-12-01 | Discharge: 2023-12-01 | Disposition: A | Discharge status: Home / Self Care | Payer: 59 | Source: Ambulatory Visit | Attending: Family Medicine | Admitting: Family Medicine

## 2023-12-01 DIAGNOSIS — R1900 Intra-abdominal and pelvic swelling, mass and lump, unspecified site: Secondary | ICD-10-CM | POA: Diagnosis not present

## 2023-12-01 DIAGNOSIS — R1905 Periumbilic swelling, mass or lump: Secondary | ICD-10-CM | POA: Diagnosis not present

## 2023-12-06 ENCOUNTER — Encounter (INDEPENDENT_AMBULATORY_CARE_PROVIDER_SITE_OTHER): Payer: Self-pay | Admitting: *Deleted

## 2023-12-22 ENCOUNTER — Emergency Department (HOSPITAL_COMMUNITY): Payer: 59

## 2023-12-22 ENCOUNTER — Other Ambulatory Visit: Payer: Self-pay

## 2023-12-22 ENCOUNTER — Encounter (HOSPITAL_COMMUNITY): Payer: Self-pay

## 2023-12-22 ENCOUNTER — Observation Stay (HOSPITAL_COMMUNITY)
Admission: EM | Admit: 2023-12-22 | Discharge: 2023-12-23 | Disposition: A | Discharge status: Home / Self Care | Payer: 59 | Attending: Internal Medicine | Admitting: Internal Medicine

## 2023-12-22 DIAGNOSIS — I6521 Occlusion and stenosis of right carotid artery: Secondary | ICD-10-CM | POA: Insufficient documentation

## 2023-12-22 DIAGNOSIS — E039 Hypothyroidism, unspecified: Secondary | ICD-10-CM | POA: Diagnosis not present

## 2023-12-22 DIAGNOSIS — Z1152 Encounter for screening for COVID-19: Secondary | ICD-10-CM | POA: Diagnosis not present

## 2023-12-22 DIAGNOSIS — R7989 Other specified abnormal findings of blood chemistry: Secondary | ICD-10-CM | POA: Diagnosis present

## 2023-12-22 DIAGNOSIS — J101 Influenza due to other identified influenza virus with other respiratory manifestations: Secondary | ICD-10-CM | POA: Insufficient documentation

## 2023-12-22 DIAGNOSIS — F418 Other specified anxiety disorders: Secondary | ICD-10-CM | POA: Diagnosis present

## 2023-12-22 DIAGNOSIS — Z794 Long term (current) use of insulin: Secondary | ICD-10-CM | POA: Diagnosis not present

## 2023-12-22 DIAGNOSIS — J441 Chronic obstructive pulmonary disease with (acute) exacerbation: Principal | ICD-10-CM | POA: Diagnosis present

## 2023-12-22 DIAGNOSIS — Z87891 Personal history of nicotine dependence: Secondary | ICD-10-CM | POA: Insufficient documentation

## 2023-12-22 DIAGNOSIS — J9611 Chronic respiratory failure with hypoxia: Secondary | ICD-10-CM | POA: Diagnosis not present

## 2023-12-22 DIAGNOSIS — R0602 Shortness of breath: Secondary | ICD-10-CM | POA: Diagnosis not present

## 2023-12-22 DIAGNOSIS — F419 Anxiety disorder, unspecified: Secondary | ICD-10-CM | POA: Diagnosis not present

## 2023-12-22 DIAGNOSIS — Z7982 Long term (current) use of aspirin: Secondary | ICD-10-CM | POA: Diagnosis not present

## 2023-12-22 DIAGNOSIS — E782 Mixed hyperlipidemia: Secondary | ICD-10-CM | POA: Diagnosis not present

## 2023-12-22 DIAGNOSIS — Z743 Need for continuous supervision: Secondary | ICD-10-CM | POA: Diagnosis not present

## 2023-12-22 DIAGNOSIS — E119 Type 2 diabetes mellitus without complications: Secondary | ICD-10-CM | POA: Diagnosis not present

## 2023-12-22 DIAGNOSIS — Z79899 Other long term (current) drug therapy: Secondary | ICD-10-CM | POA: Insufficient documentation

## 2023-12-22 DIAGNOSIS — I499 Cardiac arrhythmia, unspecified: Secondary | ICD-10-CM | POA: Diagnosis not present

## 2023-12-22 DIAGNOSIS — I10 Essential (primary) hypertension: Secondary | ICD-10-CM | POA: Diagnosis present

## 2023-12-22 DIAGNOSIS — J111 Influenza due to unidentified influenza virus with other respiratory manifestations: Secondary | ICD-10-CM

## 2023-12-22 DIAGNOSIS — R6889 Other general symptoms and signs: Secondary | ICD-10-CM | POA: Diagnosis not present

## 2023-12-22 DIAGNOSIS — I2510 Atherosclerotic heart disease of native coronary artery without angina pectoris: Secondary | ICD-10-CM | POA: Insufficient documentation

## 2023-12-22 DIAGNOSIS — R0600 Dyspnea, unspecified: Secondary | ICD-10-CM | POA: Diagnosis not present

## 2023-12-22 DIAGNOSIS — R000 Tachycardia, unspecified: Secondary | ICD-10-CM | POA: Diagnosis not present

## 2023-12-22 LAB — COMPREHENSIVE METABOLIC PANEL
ALT: 39 U/L (ref 0–44)
AST: 33 U/L (ref 15–41)
Albumin: 4.1 g/dL (ref 3.5–5.0)
Alkaline Phosphatase: 73 U/L (ref 38–126)
Anion gap: 11 (ref 5–15)
BUN: 16 mg/dL (ref 8–23)
CO2: 33 mmol/L — ABNORMAL HIGH (ref 22–32)
Calcium: 9.3 mg/dL (ref 8.9–10.3)
Chloride: 93 mmol/L — ABNORMAL LOW (ref 98–111)
Creatinine, Ser: 0.74 mg/dL (ref 0.44–1.00)
GFR, Estimated: >60 mL/min (ref >60)
Glucose, Bld: 103 mg/dL — ABNORMAL HIGH (ref 70–99)
Potassium: 4 mmol/L (ref 3.5–5.1)
Sodium: 137 mmol/L (ref 135–145)
Total Bilirubin: 0.5 mg/dL (ref 0.0–1.2)
Total Protein: 7.6 g/dL (ref 6.5–8.1)

## 2023-12-22 LAB — CBC WITH DIFFERENTIAL/PLATELET
Abs Immature Granulocytes: 0.05 10*3/uL (ref 0.00–0.07)
Basophils Absolute: 0.1 10*3/uL (ref 0.0–0.1)
Basophils Relative: 2 %
Eosinophils Absolute: 0.2 10*3/uL (ref 0.0–0.5)
Eosinophils Relative: 4 %
HCT: 46.9 % — ABNORMAL HIGH (ref 36.0–46.0)
Hemoglobin: 15.3 g/dL — ABNORMAL HIGH (ref 12.0–15.0)
Immature Granulocytes: 1 %
Lymphocytes Relative: 18 %
Lymphs Abs: 0.9 10*3/uL (ref 0.7–4.0)
MCH: 31 pg (ref 26.0–34.0)
MCHC: 32.6 g/dL (ref 30.0–36.0)
MCV: 95.1 fL (ref 80.0–100.0)
Monocytes Absolute: 0.4 10*3/uL (ref 0.1–1.0)
Monocytes Relative: 9 %
Neutro Abs: 3.4 10*3/uL (ref 1.7–7.7)
Neutrophils Relative %: 66 %
Platelets: 205 10*3/uL (ref 150–400)
RBC: 4.93 MIL/uL (ref 3.87–5.11)
RDW: 12.9 % (ref 11.5–15.5)
Smear Review: ADEQUATE
WBC: 5.1 10*3/uL (ref 4.0–10.5)
nRBC: 0 % (ref 0.0–0.2)

## 2023-12-22 LAB — BRAIN NATRIURETIC PEPTIDE: B Natriuretic Peptide: 99 pg/mL (ref 0.0–100.0)

## 2023-12-22 LAB — RESP PANEL BY RT-PCR (RSV, FLU A&B, COVID)  RVPGX2
Influenza A by PCR: POSITIVE — AB
Influenza B by PCR: NEGATIVE
Resp Syncytial Virus by PCR: NEGATIVE
SARS Coronavirus 2 by RT PCR: NEGATIVE

## 2023-12-22 LAB — TROPONIN I (HIGH SENSITIVITY)
Troponin I (High Sensitivity): 8 ng/L (ref <18)
Troponin I (High Sensitivity): 24 ng/L — ABNORMAL HIGH (ref <18)
Troponin I (High Sensitivity): 34 ng/L — ABNORMAL HIGH (ref <18)

## 2023-12-22 LAB — PROCALCITONIN: Procalcitonin: <0.1 ng/mL

## 2023-12-22 MED ORDER — ACETAMINOPHEN 325 MG PO TABS
650.0000 mg | ORAL_TABLET | Freq: Four times a day (QID) | ORAL | Status: DC | PRN
  Administered 2023-12-23: 650 mg via ORAL
  Filled 2023-12-22: qty 2

## 2023-12-22 MED ORDER — METHYLPREDNISOLONE SODIUM SUCC 125 MG IJ SOLR
60.0000 mg | Freq: Two times a day (BID) | INTRAMUSCULAR | Status: DC
  Administered 2023-12-22 – 2023-12-23 (×2): 60 mg via INTRAVENOUS
  Filled 2023-12-22 (×2): qty 2

## 2023-12-22 MED ORDER — ORAL CARE MOUTH RINSE: 15.0000 mL | OROMUCOSAL | Status: DC | PRN

## 2023-12-22 MED ORDER — ACETAMINOPHEN 650 MG RE SUPP: 650.0000 mg | Freq: Four times a day (QID) | RECTAL | Status: DC | PRN

## 2023-12-22 MED ORDER — ACETAMINOPHEN 325 MG PO TABS
650.0000 mg | ORAL_TABLET | Freq: Once | ORAL | Status: AC
  Administered 2023-12-22: 650 mg via ORAL
  Filled 2023-12-22: qty 2

## 2023-12-22 MED ORDER — OSELTAMIVIR PHOSPHATE 75 MG PO CAPS
75.0000 mg | ORAL_CAPSULE | Freq: Two times a day (BID) | ORAL | Status: DC
  Administered 2023-12-22 – 2023-12-23 (×2): 75 mg via ORAL
  Filled 2023-12-22 (×2): qty 1

## 2023-12-22 MED ORDER — BUDESONIDE 0.5 MG/2ML IN SUSP
0.5000 mg | Freq: Two times a day (BID) | RESPIRATORY_TRACT | Status: DC
  Administered 2023-12-22 – 2023-12-23 (×3): 0.5 mg via RESPIRATORY_TRACT
  Filled 2023-12-22 (×3): qty 2

## 2023-12-22 MED ORDER — METHYLPREDNISOLONE SODIUM SUCC 125 MG IJ SOLR
125.0000 mg | Freq: Once | INTRAMUSCULAR | Status: AC
  Administered 2023-12-22: 125 mg via INTRAVENOUS
  Filled 2023-12-22: qty 2

## 2023-12-22 MED ORDER — ONDANSETRON HCL 4 MG/2ML IJ SOLN: 4.0000 mg | Freq: Four times a day (QID) | INTRAMUSCULAR | Status: DC | PRN

## 2023-12-22 MED ORDER — OSELTAMIVIR PHOSPHATE 75 MG PO CAPS
75.0000 mg | ORAL_CAPSULE | Freq: Once | ORAL | Status: AC
  Administered 2023-12-22: 75 mg via ORAL
  Filled 2023-12-22: qty 1

## 2023-12-22 MED ORDER — LORAZEPAM 0.5 MG PO TABS: 0.5000 mg | ORAL_TABLET | Freq: Four times a day (QID) | ORAL | Status: DC | PRN

## 2023-12-22 MED ORDER — ARFORMOTEROL TARTRATE 15 MCG/2ML IN NEBU
15.0000 ug | INHALATION_SOLUTION | Freq: Two times a day (BID) | RESPIRATORY_TRACT | Status: DC
  Administered 2023-12-22 – 2023-12-23 (×3): 15 ug via RESPIRATORY_TRACT
  Filled 2023-12-22 (×3): qty 2

## 2023-12-22 MED ORDER — IPRATROPIUM-ALBUTEROL 0.5-2.5 (3) MG/3ML IN SOLN
3.0000 mL | Freq: Once | RESPIRATORY_TRACT | Status: AC
  Administered 2023-12-22: 3 mL via RESPIRATORY_TRACT
  Filled 2023-12-22: qty 3

## 2023-12-22 MED ORDER — ATORVASTATIN CALCIUM 40 MG PO TABS
80.0000 mg | ORAL_TABLET | Freq: Every day | ORAL | Status: DC
  Administered 2023-12-23: 80 mg via ORAL
  Filled 2023-12-22: qty 2

## 2023-12-22 MED ORDER — IPRATROPIUM-ALBUTEROL 0.5-2.5 (3) MG/3ML IN SOLN
3.0000 mL | Freq: Four times a day (QID) | RESPIRATORY_TRACT | Status: DC
  Administered 2023-12-22 – 2023-12-23 (×4): 3 mL via RESPIRATORY_TRACT
  Filled 2023-12-22 (×4): qty 3

## 2023-12-22 MED ORDER — ENOXAPARIN SODIUM 40 MG/0.4ML IJ SOSY
40.0000 mg | PREFILLED_SYRINGE | INTRAMUSCULAR | Status: DC
  Administered 2023-12-22: 40 mg via SUBCUTANEOUS
  Filled 2023-12-22: qty 0.4

## 2023-12-22 MED ORDER — OXYCODONE-ACETAMINOPHEN 5-325 MG PO TABS: 1.0000 | ORAL_TABLET | ORAL | Status: DC | PRN

## 2023-12-22 MED ORDER — ASPIRIN 81 MG PO TBEC
81.0000 mg | DELAYED_RELEASE_TABLET | Freq: Every day | ORAL | Status: DC
  Administered 2023-12-22 – 2023-12-23 (×2): 81 mg via ORAL
  Filled 2023-12-22 (×2): qty 1

## 2023-12-22 MED ORDER — METOPROLOL TARTRATE 25 MG PO TABS
25.0000 mg | ORAL_TABLET | Freq: Two times a day (BID) | ORAL | Status: DC
  Administered 2023-12-22 – 2023-12-23 (×3): 25 mg via ORAL
  Filled 2023-12-22 (×3): qty 1

## 2023-12-22 MED ORDER — ONDANSETRON HCL 4 MG PO TABS
4.0000 mg | ORAL_TABLET | Freq: Four times a day (QID) | ORAL | Status: DC | PRN
Start: 2023-12-22 — End: 2023-12-23

## 2023-12-22 NOTE — ED Triage Notes (Signed)
Pt has a hx of COPD and asthma. Wears O2 at night and as needed. Was on 3L at home. Throat felt funny yesterday morning, and started to run a fever. Breathing become hard and tight around 1700. Breathing treatments and rescue inhaler done at home and with [facility] enroute.

## 2023-12-22 NOTE — H&P (Signed)
History and Physical    Patient: Tabitha Schmidt:[id number] DOB: [birth year] DOA: 12/22/2023 DOS: the patient was seen and examined on 12/22/2023 PCP: [name], MD  Patient coming from: Home  Chief Complaint:  Chief Complaint  Patient presents with   Shortness of Breath   HPI: Tabitha Schmidt is a 68 year old female with history of COPD, coronary artery disease, hypertension, chronic respiratory failure on 3 L at nighttime, hypothyroidism presenting with coughing, shortness of breath and wheezing that started on 12/21/2023.  The patient woke up in the morning up 12/21/23 with a sore throat.  Later in the day, the patient has subjective fevers and chills, and her symptoms progressed to having increasing shortness of breath and coughing.  She is coughing up yellow sputum.  She denies any hemoptysis.  She denies any nausea, vomiting or diarrhea.  She has some chest discomfort from coughing.  She denies any abdominal pain, hematochezia, melena. She states that she quit smoking in November 2024 after at least 35-pack-year history. Patient used her albuterol nebulizer at home without much improvement.  As result she presented for further evaluation and treatment. In the ED, the patient was febrile to 100.7 F.  He was hemodynamically stable.  She ambulated with 3 L with saturation down to 93%.  She remains short of breath with wheezing.  The patient was treated with IV Solu-Medrol bronchodilators.  WBC 5.1, hemoglobin 15.2, platelet 205.  Sodium 137, potassium 4.0, bicarbonate 33, serum creatinine 0.74.  AST 33, ALT 39, alk phosphatase 73, total bilirubin 0.5.  Troponin 8>> 24.  EKG showed sinus rhythm without any ST ST wave changes.  Chest x-ray showed chronic interstitial markings.  Review of Systems: As mentioned in the history of present illness. All other systems reviewed and are negative. Past Medical History:  Diagnosis Date   Anxiety    Arthritis    Asthma    Cataracts, bilateral    [name]    Complication of anesthesia    Note: pt has hx of TMJ " every now and then my jaw will lock "   COPD (chronic obstructive pulmonary disease) (HCC)    Coronary artery disease    a. s/p DES to mid-RCA in 05/2019   DM (diabetes mellitus) (HCC)    "came off medicines with dr's permission"   Family history of adverse reaction to anesthesia    " my son woke up crying "   Fatty liver    Fibromyalgia    GERD (gastroesophageal reflux disease)    Hyperlipidemia    stopped meds on her own   Hypothyroid    Myocardial infarction (HCC)    On supplemental oxygen therapy    at night and PRN   Pneumonia    Shortness of breath    Stenosis of right carotid artery    TMJ (dislocation of temporomandibular joint)    Past Surgical History:  Procedure Laterality Date   ABDOMINAL HYSTERECTOMY     BREAST SURGERY     removal of bilateral benign tumors   CARDIAC CATHETERIZATION     2010   CHOLECYSTECTOMY     CORONARY STENT INTERVENTION N/A 05/18/2019   Procedure: CORONARY STENT INTERVENTION;  Surgeon: Runell Gess, MD;  Location: [facility];  Service: Cardiovascular;  Laterality: N/A;   DILATION AND CURETTAGE OF UTERUS     ESOPHAGOGASTRODUODENOSCOPY  01/26/2012   Procedure: ESOPHAGOGASTRODUODENOSCOPY (EGD);  Surgeon: Malissa Hippo, MD;  Location: [facility];  Service: Endoscopy;  Laterality: N/A;  730   LEFT HEART CATH AND CORONARY ANGIOGRAPHY N/A 05/18/2019   Procedure: LEFT HEART CATH AND CORONARY ANGIOGRAPHY;  Surgeon: Runell Gess, MD;  Location: [facility];  Service: Cardiovascular;  Laterality: N/A;   TRANSCAROTID ARTERY REVASCULARIZATION  Right 08/02/2019   Procedure: TRANSCAROTID ARTERY REVASCULARIZATION RIGHT;  Surgeon: Nada Libman, MD;  Location: [facility] OR;  Service: Vascular;  Laterality: Right;   TUBAL LIGATION     Social History:  reports that she quit smoking about 15 months ago. Her smoking use included cigarettes and e-cigarettes. She started smoking about  56 years ago. She has a 41.3 pack-year smoking history. She has never used smokeless tobacco. She reports that she does not drink alcohol and does not use drugs.  No Known Allergies  Family History  Problem Relation Age of Onset   CAD Father    Anesthesia problems Neg Hx    Hypotension Neg Hx    Malignant hyperthermia Neg Hx    Pseudochol deficiency Neg Hx     Prior to Admission medications   Medication Sig Start Date End Date Taking? Authorizing Provider  aspirin EC 81 MG EC tablet Take 1 tablet (81 mg total) by mouth daily. 05/15/19   Vassie Loll, MD  atorvastatin (LIPITOR) 80 MG tablet TAKE ONE TABLET BY MOUTH AT 6PM. Strength: 80 mg 06/08/23   Wendall Stade, MD  BREZTRI AEROSPHERE 160-9-4.8 MCG/ACT AERO Inhale 2 puffs into the lungs daily. 03/19/20   [provider]  Cholecalciferol (VITAMIN D3) 1.25 MG (50000 UT) CAPS Take 1 capsule by mouth once a week.    [provider]  guaiFENesin (MUCINEX) 600 MG 12 hr tablet Take 1,200 mg by mouth 2 (two) times daily.    [provider]  hydrochlorothiazide (HYDRODIURIL) 12.5 MG tablet Take 12.5 mg by mouth daily. 01/11/22   [provider]  ipratropium-albuterol (DUONEB) 0.5-2.5 (3) MG/3ML SOLN Take 3 mLs by nebulization every 4 (four) hours as needed (Wheezing/SOB). 10/23/23   [provider]  levocetirizine (XYZAL) 5 MG tablet Take 5 mg by mouth daily.    [provider]  LORazepam (ATIVAN) 0.5 MG tablet Take 0.5 mg by mouth 4 (four) times daily as needed. 06/29/20   [provider]  LORazepam (ATIVAN) 1 MG tablet Take 1 tablet by mouth 4 (four) times daily as needed. 11/29/23   [provider]  meclizine (ANTIVERT) 25 MG tablet Take 25 mg by mouth every 6 (six) hours. 02/17/20   [provider]  metoprolol tartrate (LOPRESSOR) 50 MG tablet Take 1 tablet (50 mg total) by mouth 2 (two) times daily. 06/08/23 06/02/24  Wendall Stade, MD  NALTREXONE HCL PO Take 4.5 mg by  mouth every morning.    [provider]  nitroGLYCERIN (NITROSTAT) 0.4 MG SL tablet Place 1 tablet (0.4 mg total) under the tongue every 5 (five) minutes as needed for chest pain. 02/10/21 04/28/23  Wendall Stade, MD  OZEMPIC, 0.25 OR 0.5 MG/DOSE, 2 MG/3ML SOPN Inject 0.25 mg into the skin once a week.    [provider]  potassium chloride (KLOR-CON) 10 MEQ tablet Take 1 tablet (10 mEq total) by mouth daily. 10/27/23   Wendall Stade, MD  predniSONE (STERAPRED UNI-PAK 21 TAB) 10 MG (21) TBPK tablet Per dose pack 11/28/23   [provider]  PROAIR HFA 108 (90 Base) MCG/ACT inhaler Inhale 1 puff into the lungs every 6 (six) hours as needed for wheezing or  shortness of breath.  01/15/19   [provider]  RESTASIS 0.05 % ophthalmic emulsion 1 drop 2 (two) times daily. 01/27/21   [provider]  thyroid (ARMOUR) 90 MG tablet Take 90 mg by mouth daily.    [provider]  varenicline (CHANTIX) 1 MG tablet Take 1 mg by mouth 2 (two) times daily. 04/13/23   [provider]    Physical Exam: Vitals:   12/22/23 0630 12/22/23 0631 12/22/23 0730 12/22/23 0734  BP: (!) 123/101  137/68   Pulse: (!) 109 (!) 111 92   Resp:  15 (!) 21   Temp:    98.7 F (37.1 C)  TempSrc:    Oral  SpO2: 95% 96% 97%   Weight:      Height:       GENERAL:  A&O x 3, NAD, well developed, cooperative, follows commands HEENT: [place]/AT, No thrush, No icterus, No oral ulcers Neck:  No neck mass, No meningismus, soft, supple CV: RRR, no S3, no S4, no rub, no JVD Lungs:  diminished BS.  Bibasilar rales.  Bibasilar wheeze Abd: soft/NT +BS, nondistended Ext: No edema, no lymphangitis, no cyanosis, no rashes Neuro:  CN II-XII intact, strength 4/5 in RUE, RLE, strength 4/5 LUE, LLE; sensation intact bilateral; no dysmetria; babinski equivocal  Data Reviewed: Data reviewed above in history  Assessment and Plan: COPD exacerbation -start brovana -start  pulmicort -continue duonebs -continue IV solumedrol  Influenza -continue oseltamivir  Chronic respiratory failure with hypoxia -chronically on 3L at night  Anxiety -continue home ativan -PDMP reviewed -ativan 1 mg, #120, last refill 11/07/23  Essential hypertension -Continue metoprolol tartrate -Holding HCTZ temporarily  Hypothyroidism -Continue thyroid Armour  Coronary artery disease -cath 05/18/19 for NSTEMI showing occluded mid/distal RCA stented with good results   Mixed hyperlipidemia -Continue statin  Carotid stenosis  -post stenting right ICA Duplex 01/23/23 patent with no obstructive ICS dx bilaterally F/U Dr Myra Gianotti      Advance Care Planning: FULL  Consults: none  Family Communication: none  Severity of Illness: The appropriate patient status for this patient is INPATIENT. Inpatient status is judged to be reasonable and necessary in order to provide the required intensity of service to ensure the patient's safety. The patient's presenting symptoms, physical exam findings, and initial radiographic and laboratory data in the context of their chronic comorbidities is felt to place them at high risk for further clinical deterioration. Furthermore, it is not anticipated that the patient will be medically stable for discharge from the hospital within 2 midnights of admission.   * I certify that at the point of admission it is my clinical judgment that the patient will require inpatient hospital care spanning beyond 2 midnights from the point of admission due to high intensity of service, high risk for further deterioration and high frequency of surveillance required.*  Author: Catarina Hartshorn, MD 12/22/2023 10:17 AM  For on call review [email or web address].

## 2023-12-22 NOTE — Progress Notes (Signed)
2100. Messaged Dr. Thomes Dinning in regards to patient at home meds. No new orders given will pass on for am doctor and nurse to address per dr. Jacqualin Combes

## 2023-12-22 NOTE — ED Notes (Signed)
Walked pt around the nurses station with the pulse ox. O2 maintained between 93-95% however patient seemed very short of breath and uncomfortable with difficulty completing sentences in-between breathes.

## 2023-12-22 NOTE — ED Provider Notes (Signed)
[place] EMERGENCY DEPARTMENT AT [facility] Provider Note   CSN: [id number] Arrival date & time: 12/22/23  0518     History  Chief Complaint  Patient presents with   Shortness of Breath    Tabitha Schmidt is a 68 y.o. female.  Patient is a 68 year old female with history of COPD, coronary artery disease, hypertension, hypothyroidism.  Patient is on home oxygen 3 L at night and as needed.  Patient presenting today with a several day history of cough that is productive of green sputum, shortness of breath, and wheezing.  She has been using her albuterol treatments at home with little relief.  Symptoms worsened tonight, then patient brought by EMS for evaluation.  The history is provided by the patient.       Home Medications Prior to Admission medications   Medication Sig Start Date End Date Taking? Authorizing Provider  aspirin EC 81 MG EC tablet Take 1 tablet (81 mg total) by mouth daily. 05/15/19   Vassie Loll, MD  atorvastatin (LIPITOR) 80 MG tablet TAKE ONE TABLET BY MOUTH AT 6PM. Strength: 80 mg 06/08/23   Wendall Stade, MD  BREZTRI AEROSPHERE 160-9-4.8 MCG/ACT AERO Inhale 2 puffs into the lungs daily. 03/19/20   [provider]  Cholecalciferol (VITAMIN D3) 1.25 MG (50000 UT) CAPS Take 1 capsule by mouth once a week.    [provider]  guaiFENesin (MUCINEX) 600 MG 12 hr tablet Take 1,200 mg by mouth 2 (two) times daily.    [provider]  hydrochlorothiazide (HYDRODIURIL) 12.5 MG tablet Take 12.5 mg by mouth daily. 01/11/22   [provider]  levocetirizine (XYZAL) 5 MG tablet Take 5 mg by mouth daily.    [provider]  LORazepam (ATIVAN) 0.5 MG tablet Take 0.5 mg by mouth 4 (four) times daily as needed. 06/29/20   [provider]  meclizine (ANTIVERT) 25 MG tablet Take 25 mg by mouth every 6 (six) hours. 02/17/20   [provider]  metoprolol tartrate (LOPRESSOR) 50 MG tablet Take 1 tablet (50 mg  total) by mouth 2 (two) times daily. 06/08/23 06/02/24  Wendall Stade, MD  NALTREXONE HCL PO Take 4.5 mg by mouth every morning.    [provider]  nitroGLYCERIN (NITROSTAT) 0.4 MG SL tablet Place 1 tablet (0.4 mg total) under the tongue every 5 (five) minutes as needed for chest pain. 02/10/21 04/28/23  Wendall Stade, MD  potassium chloride (KLOR-CON) 10 MEQ tablet Take 1 tablet (10 mEq total) by mouth daily. 10/27/23   Wendall Stade, MD  PROAIR HFA 108 [phone] Base) MCG/ACT inhaler Inhale 1 puff into the lungs every 6 (six) hours as needed for wheezing or shortness of breath.  01/15/19   [provider]  RESTASIS 0.05 % ophthalmic emulsion 1 drop 2 (two) times daily. 01/27/21   [provider]  thyroid (ARMOUR) 90 MG tablet Take 90 mg by mouth daily.    [provider]  varenicline (CHANTIX) 1 MG tablet Take 1 mg by mouth 2 (two) times daily. 04/13/23   [provider]      Allergies    Patient has no known allergies.    Review of Systems   Review of Systems  All other systems reviewed and are negative.   Physical Exam Updated Vital Signs BP (!) 160/78 (BP Location: Left Arm)   Pulse (!) 106   Temp (!) 100.7 F (38.2 C) (Oral)   Resp (!) 21  Ht 5\' 7"  (1.702 m)   Wt 90.7 kg   SpO2 97%   BMI 31.32 kg/m  Physical Exam Vitals and nursing note reviewed.  Constitutional:      General: She is not in acute distress.    Appearance: She is well-developed. She is not diaphoretic.  HENT:     Head: Normocephalic and atraumatic.  Cardiovascular:     Rate and Rhythm: Normal rate and regular rhythm.     Heart sounds: No murmur heard.    No friction rub. No gallop.  Pulmonary:     Effort: Pulmonary effort is normal. No respiratory distress.     Breath sounds: Examination of the right-middle field reveals rhonchi. Examination of the left-middle field reveals rhonchi. Rhonchi present. No wheezing.  Abdominal:     General: Bowel sounds are normal.  There is no distension.     Palpations: Abdomen is soft.     Tenderness: There is no abdominal tenderness.  Musculoskeletal:        General: Normal range of motion.     Cervical back: Normal range of motion and neck supple.     Right lower leg: No tenderness. No edema.     Left lower leg: No tenderness. No edema.  Skin:    General: Skin is warm and dry.  Neurological:     General: No focal deficit present.     Mental Status: She is alert and oriented to person, place, and time.     ED Results / Procedures / Treatments   Labs (all labs ordered are listed, but only abnormal results are displayed) Labs Reviewed  RESP PANEL BY RT-PCR (RSV, FLU A&B, COVID)  [id number]  COMPREHENSIVE METABOLIC PANEL  CBC WITH DIFFERENTIAL/PLATELET  BRAIN NATRIURETIC PEPTIDE  TROPONIN I (HIGH SENSITIVITY)    EKG EKG Interpretation Date/Time:  Friday December 22 2023 05:27:09 EST Ventricular Rate:  113 PR Interval:  154 QRS Duration:  74 QT Interval:  307 QTC Calculation: 421 R Axis:   57  Text Interpretation: Sinus tachycardia Right atrial enlargement Artifact in lead(s) I II III aVR aVL aVF V1 V2 V4 V5 V6 Confirmed by Geoffery Lyons ([id number]) on 12/22/2023 5:32:33 AM  Radiology No results found.  Procedures Procedures    Medications Ordered in ED Medications  ipratropium-albuterol (DUONEB) 0.5-2.5 (3) MG/3ML nebulizer solution 3 mL (has no administration in time range)  methylPREDNISolone sodium succinate (SOLU-MEDROL) 125 mg/2 mL injection 125 mg (has no administration in time range)  acetaminophen (TYLENOL) tablet 650 mg (has no administration in time range)    ED Course/ Medical Decision Making/ A&P  Final Clinical Impression(s) / ED Diagnoses Final diagnoses:  None   Patient is a 68 year old female presenting with complaints of productive cough and shortness of breath.  She has history of COPD.  Patient arrives here febrile, but with no hypoxia.  She does have wheezing throughout on  exam after receiving a nebulizer treatment by EMS.  Laboratory studies obtained including CBC, metabolic panel, troponin, all of which are basically unremarkable.  There is a minimal elevation of her troponin, the significance of which I am uncertain.  Chest x-ray showing hyperexpansion with chronic interstitial coarsening, but no acute findings.  She has been given a DuoNeb, Solu-Medrol, and is awaiting results of a respiratory panel.  Patient signed out to oncoming provider at shift change for results of the respiratory panel, reassessment, and determination of final disposition.  Rx / DC Orders ED Discharge Orders     None  Geoffery Lyons, MD 12/22/23 2306

## 2023-12-22 NOTE — Hospital Course (Signed)
68 year old female with history of COPD, coronary artery disease, hypertension, chronic respiratory failure on 3 L at nighttime, hypothyroidism presenting with coughing, shortness of breath and wheezing that started on 12/21/2023.  The patient woke up in the morning up 12/21/23 with a sore throat.  Later in the day, the patient has subjective fevers and chills, and her symptoms progressed to having increasing shortness of breath and coughing.  She is coughing up yellow sputum.  She denies any hemoptysis.  She denies any nausea, vomiting or diarrhea.  She has some chest discomfort from coughing.  She denies any abdominal pain, hematochezia, melena. She states that she quit smoking in November 2024 after at least 35-pack-year history. Patient used her albuterol nebulizer at home without much improvement.  As result she presented for further evaluation and treatment. In the ED, the patient was febrile to 100.7 F.  He was hemodynamically stable.  She ambulated with 3 L with saturation down to 93%.  She remains short of breath with wheezing.  The patient was treated with IV Solu-Medrol bronchodilators.  WBC 5.1, hemoglobin 15.2, platelet 205.  Sodium 137, potassium 4.0, bicarbonate 33, serum creatinine 0.74.  AST 33, ALT 39, alk phosphatase 73, total bilirubin 0.5.  Troponin 8>> 24.  EKG showed sinus rhythm without any ST ST wave changes.  Chest x-ray showed chronic interstitial markings.

## 2023-12-22 NOTE — ED Provider Notes (Signed)
Signout from Dr. Judd Lien.  68 year old female with history of COPD, uses home oxygen here with increased shortness of breath low-grade fevers.  Had some wheezing on exam.  Chest x-ray without infiltrate and normal white count normal renal function troponin BNP normal.  Has received breathing treatments Tylenol steroids.  Pending respiratory panel.  Plan is to follow-up on respiratory panel and reassess breathing status Physical Exam  BP (!) 123/101   Pulse (!) 111   Temp (!) 100.7 F (38.2 C) (Oral)   Resp 15   Ht 5\' 7"  (1.702 m)   Wt 90.7 kg   SpO2 96%   BMI 31.32 kg/m   Physical Exam  Procedures  Procedures  ED Course / MDM    Medical Decision Making Amount and/or Complexity of Data Reviewed Labs: ordered. Radiology: ordered.  Risk OTC drugs. Prescription drug management.   9:30 AM.  Updated patient on her influenza positive.  Have ordered her Tamiflu.  Ambulatory trial here.  Saturations remained above 93% but she was quite dyspneic.  She does not feel she is able to go home.  Hospitalist has been paged  9:54 AM.  Discussed with Dr. Arbutus Leas hospitalist who will evaluate for admission     Terrilee Files, MD 12/22/23 [phone]

## 2023-12-23 DIAGNOSIS — J9611 Chronic respiratory failure with hypoxia: Secondary | ICD-10-CM | POA: Diagnosis not present

## 2023-12-23 DIAGNOSIS — R7989 Other specified abnormal findings of blood chemistry: Secondary | ICD-10-CM | POA: Diagnosis not present

## 2023-12-23 DIAGNOSIS — J441 Chronic obstructive pulmonary disease with (acute) exacerbation: Secondary | ICD-10-CM | POA: Diagnosis not present

## 2023-12-23 LAB — BASIC METABOLIC PANEL
Anion gap: 10 (ref 5–15)
BUN: 17 mg/dL (ref 8–23)
CO2: 31 mmol/L (ref 22–32)
Calcium: 9.1 mg/dL (ref 8.9–10.3)
Chloride: 93 mmol/L — ABNORMAL LOW (ref 98–111)
Creatinine, Ser: 0.53 mg/dL (ref 0.44–1.00)
GFR, Estimated: >60 mL/min (ref >60)
Glucose, Bld: 97 mg/dL (ref 70–99)
Potassium: 3.7 mmol/L (ref 3.5–5.1)
Sodium: 134 mmol/L — ABNORMAL LOW (ref 135–145)

## 2023-12-23 LAB — CBC
HCT: 42.2 % (ref 36.0–46.0)
Hemoglobin: 13.9 g/dL (ref 12.0–15.0)
MCH: 30.7 pg (ref 26.0–34.0)
MCHC: 32.9 g/dL (ref 30.0–36.0)
MCV: 93.2 fL (ref 80.0–100.0)
Platelets: 195 10*3/uL (ref 150–400)
RBC: 4.53 MIL/uL (ref 3.87–5.11)
RDW: 12.8 % (ref 11.5–15.5)
WBC: 6 10*3/uL (ref 4.0–10.5)
nRBC: 0 % (ref 0.0–0.2)

## 2023-12-23 LAB — MAGNESIUM: Magnesium: 2 mg/dL (ref 1.7–2.4)

## 2023-12-23 LAB — HIV ANTIBODY (ROUTINE TESTING W REFLEX): HIV Screen 4th Generation wRfx: NONREACTIVE

## 2023-12-23 MED ORDER — PREDNISONE 20 MG PO TABS: 50.0000 mg | ORAL_TABLET | Freq: Every day | ORAL | Status: DC

## 2023-12-23 MED ORDER — PREDNISONE 50 MG PO TABS: 50.0000 mg | ORAL_TABLET | Freq: Every day | ORAL | 0 refills | Status: DC

## 2023-12-23 MED ORDER — OSELTAMIVIR PHOSPHATE 75 MG PO CAPS: 75.0000 mg | ORAL_CAPSULE | Freq: Two times a day (BID) | ORAL | 0 refills | Status: DC

## 2023-12-23 NOTE — Plan of Care (Signed)

## 2023-12-23 NOTE — Care Management CC44 (Signed)
 Condition Code 44 Documentation Completed  Patient Details  Name: Tabitha Schmidt MRN: [id number] Date of Birth: 10-21-1956   Condition Code 44 given:   Yes Patient signature on Condition Code 44 notice:   yes Documentation of 2 MD's agreement:   Yes Code 44 added to claim:   Yes    Catalina Gravel, LCSW 12/23/2023, 1:38 PM

## 2023-12-23 NOTE — Discharge Summary (Signed)
 Physician Discharge Summary   Patient: Tabitha Schmidt MRN: [id number] DOB: 05/28/1956  Admit date:     12/22/2023  Discharge date: 12/23/23  Discharge Physician: Onalee Hua Deziya Amero   PCP: [name], MD   Recommendations at discharge:   Please follow up with primary care provider within 1-2 weeks  Please repeat BMP and CBC in one week    Hospital Course: 68 year old female with history of COPD, coronary artery disease, hypertension, chronic respiratory failure on 3 L at nighttime, hypothyroidism presenting with coughing, shortness of breath and wheezing that started on 12/21/2023.  The patient woke up in the morning up 12/21/23 with a sore throat.  Later in the day, the patient has subjective fevers and chills, and her symptoms progressed to having increasing shortness of breath and coughing.  She is coughing up yellow sputum.  She denies any hemoptysis.  She denies any nausea, vomiting or diarrhea.  She has some chest discomfort from coughing.  She denies any abdominal pain, hematochezia, melena. She states that she quit smoking in November 2024 after at least 35-pack-year history. Patient used her albuterol nebulizer at home without much improvement.  As result she presented for further evaluation and treatment. In the ED, the patient was febrile to 100.7 F.  He was hemodynamically stable.  She ambulated with 3 L with saturation down to 93%.  She remains short of breath with wheezing.  The patient was treated with IV Solu-Medrol bronchodilators.  WBC 5.1, hemoglobin 15.2, platelet 205.  Sodium 137, potassium 4.0, bicarbonate 33, serum creatinine 0.74.  AST 33, ALT 39, alk phosphatase 73, total bilirubin 0.5.  Troponin 8>> 24.  EKG showed sinus rhythm without any ST ST wave changes.  Chest x-ray showed chronic interstitial markings.  Assessment and Plan: COPD exacerbation -started brovana -started pulmicort -continue duonebs -continue IV solumedrol>>d/c home with IV solumedrol    Influenza -continue oseltamivir, 8 more doses after d/c   Chronic respiratory failure with hypoxia -chronically on 3L at night -now needs oxygen 24/7 -desaturated to 88% with ambulation   Anxiety -continue home ativan -PDMP reviewed -ativan 1 mg, #120, last refill 11/07/23   Essential hypertension -Continue metoprolol tartrate -Holding HCTZ temporarily>>restart after d/c   Hypothyroidism -Continue thyroid Armour   Coronary artery disease -cath 05/18/19 for NSTEMI showing occluded mid/distal RCA stented with good results  -no chest pain -elevated troponin due to demand ischemia--trend is flat, not consistent with ACS   Mixed hyperlipidemia -Continue statin   Carotid stenosis  -post stenting right ICA Duplex 01/23/23 patent with no obstructive ICS dx bilaterally F/U Dr Myra Gianotti           Consultants: none Procedures performed: none  Disposition: Home Diet recommendation:  Cardiac diet DISCHARGE MEDICATION: Allergies as of 12/23/2023   No Known Allergies      Medication List     STOP taking these medications    predniSONE 10 MG (21) Tbpk tablet Commonly known as: STERAPRED UNI-PAK 21 TAB Replaced by: predniSONE 50 MG tablet       TAKE these medications    aspirin EC 81 MG tablet Take 1 tablet (81 mg total) by mouth daily.   atorvastatin 80 MG tablet Commonly known as: LIPITOR TAKE ONE TABLET BY MOUTH AT 6PM. Strength: 80 mg   Breztri Aerosphere 160-9-4.8 MCG/ACT Aero Generic drug: Budeson-Glycopyrrol-Formoterol Inhale 2 puffs into the lungs 2 (two) times daily.   guaiFENesin 600 MG 12 hr tablet Commonly known as: MUCINEX Take 600 mg by mouth daily.  hydrochlorothiazide 12.5 MG tablet Commonly known as: HYDRODIURIL Take 12.5 mg by mouth daily.   ipratropium-albuterol 0.5-2.5 (3) MG/3ML Soln Commonly known as: DUONEB Take 3 mLs by nebulization every 4 (four) hours as needed (Wheezing/SOB).   levocetirizine 5 MG tablet Commonly known as:  XYZAL Take 5 mg by mouth daily.   LORazepam 1 MG tablet Commonly known as: ATIVAN Take 1 tablet by mouth 4 (four) times daily as needed for anxiety.   metoprolol tartrate 50 MG tablet Commonly known as: LOPRESSOR Take 1 tablet (50 mg total) by mouth 2 (two) times daily.   Naltrex 4.5 MG Caps Generic drug: Naltrexone HCl (Pain) Take 4.5 mg by mouth daily.   nitroGLYCERIN 0.4 MG SL tablet Commonly known as: NITROSTAT Place 1 tablet (0.4 mg total) under the tongue every 5 (five) minutes as needed for chest pain.   oseltamivir 75 MG capsule Commonly known as: TAMIFLU Take 1 capsule (75 mg total) by mouth 2 (two) times daily.   potassium chloride 10 MEQ tablet Commonly known as: KLOR-CON Take 1 tablet (10 mEq total) by mouth daily.   predniSONE 50 MG tablet Commonly known as: DELTASONE Take 1 tablet (50 mg total) by mouth daily with breakfast. Start taking on: December 24, 2023 Replaces: predniSONE 10 MG (21) Tbpk tablet   ProAir HFA 108 (90 Base) MCG/ACT inhaler Generic drug: albuterol Inhale 1 puff into the lungs every 6 (six) hours as needed for wheezing or shortness of breath.   Restasis 0.05 % ophthalmic emulsion Generic drug: cycloSPORINE Place 1 drop into both eyes 2 (two) times daily as needed (Dry eye).   thyroid 90 MG tablet Commonly known as: ARMOUR Take 90 mg by mouth daily.   thyroid 15 MG tablet Commonly known as: ARMOUR Take 15 mg by mouth daily.   varenicline 1 MG tablet Commonly known as: CHANTIX Take 1 mg by mouth daily.   Vitamin D3 1.25 MG (50000 UT) Caps Take 50,000 Units by mouth once a week.               Durable Medical Equipment  (From admission, onward)           Start     Ordered   12/23/23 1147  For home use only DME oxygen  Once       Comments: POC for COPD  Question Answer Comment  Length of Need Lifetime   Mode or (Route) Nasal cannula   Liters per Minute 3   Frequency Continuous (stationary and portable oxygen  unit needed)   Oxygen conserving device Yes   Oxygen delivery system Gas      12/23/23 1146            Discharge Exam: Filed Weights   12/22/23 0530  Weight: 90.7 kg   GENERAL:  A&O x 3, NAD, well developed, cooperative, follows commands HEENT: [place]/AT, No thrush, No icterus, No oral ulcers Neck:  No neck mass, No meningismus, soft, supple CV: RRR, no S3, no S4, no rub, no JVD Lungs:  bibasilar rales.  No wheeze Abd: soft/NT +BS, nondistended Ext: No edema, no lymphangitis, no cyanosis, no rashes Neuro:  CN II-XII intact, strength 4/5 in RUE, RLE, strength 4/5 LUE, LLE; sensation intact bilateral; no dysmetria; babinski equivocal   Condition at discharge: stable  The results of significant diagnostics from this hospitalization (including imaging, microbiology, ancillary and laboratory) are listed below for reference.   Imaging Studies: DG Chest Port 1 View Result Date: 12/22/2023 CLINICAL DATA:  Shortness  of breath and tachycardia. EXAM: PORTABLE CHEST 1 VIEW COMPARISON:  09/19/2022 FINDINGS: Lungs are hyperexpanded. The lungs are clear without focal pneumonia, edema, pneumothorax or pleural effusion. Similar appearance of biapical pleuroparenchymal scarring. Interstitial markings are diffusely coarsened with chronic features. The cardiopericardial silhouette is within normal limits for size. No acute bony abnormality. Telemetry leads overlie the chest. IMPRESSION: Hyperexpansion with chronic interstitial coarsening. No acute cardiopulmonary findings. Electronically Signed   By: Kennith Center M.D.   On: 12/22/2023 06:11   US Abdomen Limited Result Date: 12/01/2023 : PROCEDURE: ULTRASOUND ABDOMEN LIMITED HISTORY: Patient is a 68 y/o  F with periumbilical mass. COMPARISON: . TECHNIQUE: Two-dimensional grayscale and color Doppler ultrasound of the periumbilical region was performed. FINDINGS: No distinct masses are identified within the periumbilical region superior to the umbilicus.  No focal fluid collections are demonstrated. No sonographic evidence of hernia on today's ultrasound. IMPRESSION: 1. No sonographic abnormalities are identified within the periumbilical region. Thank you for allowing [place] to assist in the care of this patient. Electronically Signed   By: Lestine Box M.D.   On: 12/01/2023 21:29    Microbiology: Results for orders placed or performed during the hospital encounter of 12/22/23  Resp panel by RT-PCR (RSV, Flu A&B, Covid) Anterior Nasal Swab     Status: Abnormal   Collection Time: 12/22/23  5:48 AM   Specimen: Anterior Nasal Swab  Result Value Ref Range Status   SARS Coronavirus 2 by RT PCR NEGATIVE NEGATIVE Final    Comment: (NOTE) SARS-CoV-2 target nucleic acids are NOT DETECTED.  The SARS-CoV-2 RNA is generally detectable in upper respiratory specimens during the acute phase of infection. The lowest concentration of SARS-CoV-2 viral copies this assay can detect is 138 copies/mL. A negative result does not preclude SARS-Cov-2 infection and should not be used as the sole basis for treatment or other patient management decisions. A negative result may occur with  improper specimen collection/handling, submission of specimen other than nasopharyngeal swab, presence of viral mutation(s) within the areas targeted by this assay, and inadequate number of viral copies(<138 copies/mL). A negative result must be combined with clinical observations, patient history, and epidemiological information. The expected result is Negative.  Fact Sheet for Patients:  BloggerCourse.com  Fact Sheet for Healthcare Providers:  SeriousBroker.it  This test is no t yet approved or cleared by the [place] FDA and  has been authorized for detection and/or diagnosis of SARS-CoV-2 by FDA under an Emergency Use Authorization (EUA). This EUA will remain  in effect (meaning this test can be used) for the duration of  the COVID-19 declaration under Section 564(b)(1) of the Act, 21 U.S.C.section 360bbb-3(b)(1), unless the authorization is terminated  or revoked sooner.       Influenza A by PCR POSITIVE (A) NEGATIVE Final   Influenza B by PCR NEGATIVE NEGATIVE Final    Comment: (NOTE) The Xpert Xpress SARS-CoV-2/FLU/RSV plus assay is intended as an aid in the diagnosis of influenza from Nasopharyngeal swab specimens and should not be used as a sole basis for treatment. Nasal washings and aspirates are unacceptable for Xpert Xpress SARS-CoV-2/FLU/RSV testing.  Fact Sheet for Patients: BloggerCourse.com  Fact Sheet for Healthcare Providers: SeriousBroker.it  This test is not yet approved or cleared by the [place] FDA and has been authorized for detection and/or diagnosis of SARS-CoV-2 by FDA under an Emergency Use Authorization (EUA). This EUA will remain in effect (meaning this test can be used) for the duration of the COVID-19 declaration under Section  564(b)(1) of the Act, 21 U.S.C. section 360bbb-3(b)(1), unless the authorization is terminated or revoked.     Resp Syncytial Virus by PCR NEGATIVE NEGATIVE Final    Comment: (NOTE) Fact Sheet for Patients: BloggerCourse.com  Fact Sheet for Healthcare Providers: SeriousBroker.it  This test is not yet approved or cleared by the [place] FDA and has been authorized for detection and/or diagnosis of SARS-CoV-2 by FDA under an Emergency Use Authorization (EUA). This EUA will remain in effect (meaning this test can be used) for the duration of the COVID-19 declaration under Section 564(b)(1) of the Act, 21 U.S.C. section 360bbb-3(b)(1), unless the authorization is terminated or revoked.  Performed at [facility], [address] [place], [place] [address]   Culture, blood (Routine X 2) w Reflex to ID Panel     Status: None  (Preliminary result)   Collection Time: 12/22/23 11:22 AM   Specimen: BLOOD  Result Value Ref Range Status   Specimen Description BLOOD BLOOD RIGHT HAND  Final   Special Requests   Final    AEROBIC BOTTLE ONLY Blood Culture results may not be optimal due to an inadequate volume of blood received in culture bottles   Culture   Final    NO GROWTH < 24 HOURS Performed at [facility], [address], [place], [place] [address]    Report Status PENDING  Incomplete  Culture, blood (Routine X 2) w Reflex to ID Panel     Status: None (Preliminary result)   Collection Time: 12/22/23 11:22 AM   Specimen: BLOOD  Result Value Ref Range Status   Specimen Description BLOOD BLOOD LEFT ARM  Final   Special Requests   Final    BOTTLES DRAWN AEROBIC AND ANAEROBIC Blood Culture adequate volume   Culture   Final    NO GROWTH < 24 HOURS Performed at [facility], [address], [place], [place] [address]    Report Status PENDING  Incomplete    Labs: CBC: Recent Labs  Lab 12/22/23 0510 12/23/23 0408  WBC 5.1 6.0  NEUTROABS 3.4  --   HGB 15.3* 13.9  HCT 46.9* 42.2  MCV 95.1 93.2  PLT 205 195   Basic Metabolic Panel: Recent Labs  Lab 12/22/23 0510 12/23/23 0408  NA 137 134*  K 4.0 3.7  CL 93* 93*  CO2 33* 31  GLUCOSE 103* 97  BUN 16 17  CREATININE 0.74 0.53  CALCIUM 9.3 9.1  MG  --  2.0   Liver Function Tests: Recent Labs  Lab 12/22/23 0510  AST 33  ALT 39  ALKPHOS 73  BILITOT 0.5  PROT 7.6  ALBUMIN 4.1   CBG: No results for input(s): "GLUCAP" in the last 168 hours.  Discharge time spent: greater than 30 minutes.  Signed: Catarina Hartshorn, MD [facility] 12/23/2023

## 2023-12-23 NOTE — Progress Notes (Signed)
   12/23/23 1356  TOC Brief Assessment  Insurance and Status Reviewed  Patient has primary care physician Yes  Home environment has been reviewed From Home  Prior level of function: Independent  Prior/Current Home Services Current home services (Home 02)  Readmission risk has been reviewed Yes  Transition of care needs transition of care needs identified, TOC will continue to follow (Pt needs a portable 02 tank.- active with [facility])

## 2023-12-23 NOTE — Progress Notes (Signed)
 Nsg Discharge Note  Admit Date:  12/22/2023 Discharge date: 12/23/2023   Tabitha Schmidt to be D/C'd Home per MD order.  AVS completed.   Patient/caregiver able to verbalize understanding.  Discharge Medication: Allergies as of 12/23/2023   No Known Allergies      Medication List     STOP taking these medications    predniSONE 10 MG (21) Tbpk tablet Commonly known as: STERAPRED UNI-PAK 21 TAB Replaced by: predniSONE 50 MG tablet       TAKE these medications    aspirin EC 81 MG tablet Take 1 tablet (81 mg total) by mouth daily.   atorvastatin 80 MG tablet Commonly known as: LIPITOR TAKE ONE TABLET BY MOUTH AT 6PM. Strength: 80 mg   Breztri Aerosphere 160-9-4.8 MCG/ACT Aero Generic drug: Budeson-Glycopyrrol-Formoterol Inhale 2 puffs into the lungs 2 (two) times daily.   guaiFENesin 600 MG 12 hr tablet Commonly known as: MUCINEX Take 600 mg by mouth daily.   hydrochlorothiazide 12.5 MG tablet Commonly known as: HYDRODIURIL Take 12.5 mg by mouth daily.   ipratropium-albuterol 0.5-2.5 (3) MG/3ML Soln Commonly known as: DUONEB Take 3 mLs by nebulization every 4 (four) hours as needed (Wheezing/SOB).   levocetirizine 5 MG tablet Commonly known as: XYZAL Take 5 mg by mouth daily.   LORazepam 1 MG tablet Commonly known as: ATIVAN Take 1 tablet by mouth 4 (four) times daily as needed for anxiety.   metoprolol tartrate 50 MG tablet Commonly known as: LOPRESSOR Take 1 tablet (50 mg total) by mouth 2 (two) times daily.   Naltrex 4.5 MG Caps Generic drug: Naltrexone HCl (Pain) Take 4.5 mg by mouth daily.   nitroGLYCERIN 0.4 MG SL tablet Commonly known as: NITROSTAT Place 1 tablet (0.4 mg total) under the tongue every 5 (five) minutes as needed for chest pain.   oseltamivir 75 MG capsule Commonly known as: TAMIFLU Take 1 capsule (75 mg total) by mouth 2 (two) times daily.   potassium chloride 10 MEQ tablet Commonly known as: KLOR-CON Take 1 tablet (10 mEq  total) by mouth daily.   predniSONE 50 MG tablet Commonly known as: DELTASONE Take 1 tablet (50 mg total) by mouth daily with breakfast. Start taking on: December 24, 2023 Replaces: predniSONE 10 MG (21) Tbpk tablet   ProAir HFA 108 (90 Base) MCG/ACT inhaler Generic drug: albuterol Inhale 1 puff into the lungs every 6 (six) hours as needed for wheezing or shortness of breath.   Restasis 0.05 % ophthalmic emulsion Generic drug: cycloSPORINE Place 1 drop into both eyes 2 (two) times daily as needed (Dry eye).   thyroid 90 MG tablet Commonly known as: ARMOUR Take 90 mg by mouth daily.   thyroid 15 MG tablet Commonly known as: ARMOUR Take 15 mg by mouth daily.   varenicline 1 MG tablet Commonly known as: CHANTIX Take 1 mg by mouth daily.   Vitamin D3 1.25 MG (50000 UT) Caps Take 50,000 Units by mouth once a week.               Durable Medical Equipment  (From admission, onward)           Start     Ordered   12/23/23 1147  For home use only DME oxygen  Once       Comments: POC for COPD  Question Answer Comment  Length of Need Lifetime   Mode or (Route) Nasal cannula   Liters per Minute 3   Frequency Continuous (stationary and portable oxygen unit  needed)   Oxygen conserving device Yes   Oxygen delivery system Gas      12/23/23 1146            Discharge Assessment: Vitals:   12/23/23 0756 12/23/23 0824  BP:  134/63  Pulse:  98  Resp:    Temp:    SpO2: 95% 93%   Skin clean, dry and intact without evidence of skin break down, no evidence of skin tears noted. IV catheter discontinued intact. Site without signs and symptoms of complications - no redness or edema noted at insertion site, patient denies c/o pain - only slight tenderness at site.  Dressing with slight pressure applied.  D/c Instructions-Education: Discharge instructions given to patient/family with verbalized understanding. D/c education completed with patient/family including follow  up instructions, medication list, d/c activities limitations if indicated, with other d/c instructions as indicated by MD - patient able to verbalize understanding, all questions fully answered. Patient instructed to return to ED, call 911, or call MD for any changes in condition.  Patient escorted via WC, and D/C home via private auto.  Laurena Spies, RN 12/23/2023 1:33 PM

## 2023-12-23 NOTE — Progress Notes (Signed)
 SATURATION QUALIFICATIONS: (This note is used to comply with regulatory documentation for home oxygen)   Patient Saturations on Room Air at Rest = 90   Patient Saturations on Room Air while Ambulating = 88   Patient Saturations on 3 Liters of oxygen while Ambulating = 94   Please briefly explain why patient needs home oxygen: To maintain 02 sat at 90% or above during ambulation.  Catarina Hartshorn, DO

## 2023-12-23 NOTE — Care Management Obs Status (Signed)
 MEDICARE OBSERVATION STATUS NOTIFICATION   Patient Details  Name: Tabitha Schmidt MRN: [id number] Date of Birth: 1956-07-08   Medicare Observation Status Notification Given:  Yes    Sofija Antwi Marsh Dolly, LCSW 12/23/2023, 1:37 PM

## 2023-12-24 DIAGNOSIS — J449 Chronic obstructive pulmonary disease, unspecified: Secondary | ICD-10-CM | POA: Diagnosis not present

## 2023-12-27 LAB — CULTURE, BLOOD (ROUTINE X 2)
Culture: NO GROWTH
Culture: NO GROWTH
Special Requests: ADEQUATE

## 2024-01-16 DIAGNOSIS — E1165 Type 2 diabetes mellitus with hyperglycemia: Secondary | ICD-10-CM | POA: Diagnosis not present

## 2024-01-16 DIAGNOSIS — E039 Hypothyroidism, unspecified: Secondary | ICD-10-CM | POA: Diagnosis not present

## 2024-01-16 DIAGNOSIS — I10 Essential (primary) hypertension: Secondary | ICD-10-CM | POA: Diagnosis not present

## 2024-01-16 DIAGNOSIS — E782 Mixed hyperlipidemia: Secondary | ICD-10-CM | POA: Diagnosis not present

## 2024-01-16 DIAGNOSIS — E559 Vitamin D deficiency, unspecified: Secondary | ICD-10-CM | POA: Diagnosis not present

## 2024-01-21 DIAGNOSIS — J449 Chronic obstructive pulmonary disease, unspecified: Secondary | ICD-10-CM | POA: Diagnosis not present

## 2024-01-22 DIAGNOSIS — E538 Deficiency of other specified B group vitamins: Secondary | ICD-10-CM | POA: Diagnosis not present

## 2024-01-22 DIAGNOSIS — J449 Chronic obstructive pulmonary disease, unspecified: Secondary | ICD-10-CM | POA: Diagnosis not present

## 2024-01-22 DIAGNOSIS — E782 Mixed hyperlipidemia: Secondary | ICD-10-CM | POA: Diagnosis not present

## 2024-01-22 DIAGNOSIS — E039 Hypothyroidism, unspecified: Secondary | ICD-10-CM | POA: Diagnosis not present

## 2024-01-22 DIAGNOSIS — R1900 Intra-abdominal and pelvic swelling, mass and lump, unspecified site: Secondary | ICD-10-CM | POA: Diagnosis not present

## 2024-01-22 DIAGNOSIS — M1990 Unspecified osteoarthritis, unspecified site: Secondary | ICD-10-CM | POA: Diagnosis not present

## 2024-01-22 DIAGNOSIS — I10 Essential (primary) hypertension: Secondary | ICD-10-CM | POA: Diagnosis not present

## 2024-01-22 DIAGNOSIS — E1165 Type 2 diabetes mellitus with hyperglycemia: Secondary | ICD-10-CM | POA: Diagnosis not present

## 2024-01-22 DIAGNOSIS — E559 Vitamin D deficiency, unspecified: Secondary | ICD-10-CM | POA: Diagnosis not present

## 2024-01-22 DIAGNOSIS — I2510 Atherosclerotic heart disease of native coronary artery without angina pectoris: Secondary | ICD-10-CM | POA: Diagnosis not present

## 2024-01-22 DIAGNOSIS — Z1211 Encounter for screening for malignant neoplasm of colon: Secondary | ICD-10-CM | POA: Diagnosis not present

## 2024-01-29 ENCOUNTER — Other Ambulatory Visit: Payer: Self-pay | Admitting: Internal Medicine

## 2024-01-29 DIAGNOSIS — Z1231 Encounter for screening mammogram for malignant neoplasm of breast: Secondary | ICD-10-CM

## 2024-02-01 ENCOUNTER — Ambulatory Visit
Admission: RE | Admit: 2024-02-01 | Discharge: 2024-02-01 | Disposition: A | Discharge status: Home / Self Care | Source: Ambulatory Visit | Attending: Internal Medicine | Admitting: Internal Medicine

## 2024-02-01 DIAGNOSIS — Z1231 Encounter for screening mammogram for malignant neoplasm of breast: Secondary | ICD-10-CM | POA: Diagnosis not present

## 2024-02-08 ENCOUNTER — Other Ambulatory Visit: Payer: Self-pay | Admitting: *Deleted

## 2024-02-08 DIAGNOSIS — I6523 Occlusion and stenosis of bilateral carotid arteries: Secondary | ICD-10-CM

## 2024-02-14 ENCOUNTER — Telehealth: Payer: Self-pay

## 2024-02-14 DIAGNOSIS — X32XXXD Exposure to sunlight, subsequent encounter: Secondary | ICD-10-CM | POA: Diagnosis not present

## 2024-02-14 DIAGNOSIS — L570 Actinic keratosis: Secondary | ICD-10-CM | POA: Diagnosis not present

## 2024-02-14 DIAGNOSIS — B078 Other viral warts: Secondary | ICD-10-CM | POA: Diagnosis not present

## 2024-02-14 NOTE — Progress Notes (Signed)
   02/14/2024  Patient ID: Tabitha Schmidt, female   DOB: 05-May-1956, 68 y.o.   MRN: [id number]   Patient appeared on insurance report for not passing the quality metrics in 2024:  Medication Adherence for Diabetes (MAD)   Outreach to the patient was Successful.  Meds Tracking:  -Atorvastatin 80 mg - Last fill 90DS on 02/08/24, LDL 94 on 01/16/24. Qualifies for metric this year, next fill due 05/08/24.  -Ozempic 0.25 mg - Last fill 02/08/24, A1C 5.9% on 01/16/24. Qualifies for metric this year, next fill due 04/04/24.  Plan:  Discussed lipid management and recommended considering the addition of ezetimibe to get LDL below goal of 70. She declined and did not want any assistance with this. Said she would wait until next PCP visit and discuss with Lenette Quick. She also has not started Ozempic yet but has picked up the medication. A1C has been well controlled without. Could benefit from weight loss and CVD protection, encouraged her to take consistently. Will follow adherence for both meds.   Flint Hummer, PharmD

## 2024-02-19 ENCOUNTER — Encounter: Payer: Self-pay | Admitting: Surgery

## 2024-02-19 ENCOUNTER — Ambulatory Visit (INDEPENDENT_AMBULATORY_CARE_PROVIDER_SITE_OTHER): Payer: 59 | Admitting: Surgery

## 2024-02-19 ENCOUNTER — Ambulatory Visit (HOSPITAL_COMMUNITY)
Admission: RE | Admit: 2024-02-19 | Discharge: 2024-02-19 | Disposition: A | Discharge status: Home / Self Care | Payer: 59 | Source: Ambulatory Visit | Attending: Surgery | Admitting: Surgery

## 2024-02-19 VITALS — BP 147/77 | HR 76 | Temp 97.7°F | Resp 18 | Ht 67.0 in | Wt 194.0 lb

## 2024-02-19 DIAGNOSIS — I6523 Occlusion and stenosis of bilateral carotid arteries: Secondary | ICD-10-CM | POA: Insufficient documentation

## 2024-02-19 NOTE — Progress Notes (Addendum)
 Vascular and Vein Specialist of [facility]  Patient name: Tabitha Schmidt MRN: [id number] DOB: [birth year] Sex: female   REASON FOR VISIT:    Follow up  HISOTRY OF PRESENT ILLNESS:   Tabitha Schmidt is a 68 y.o. female with asymptomatic right carotid stenosis.  She underwent TCAR on October 2 without incident.  She was found to have a 90% lesion.  Her postoperative course was uncomplicated.  She was a high risk candidate because of recent NSTEMI with DES treatment.   Today, she denies localizing symptoms such as numbness or weakness or slurred speech.  She denies amaurosis fugax.  However, she does state that when she stands up too quickly she will feel like her head is full and comes close to passing out.  This also happens when she raises both arms of her head.   The patient does take a statin for hypercholesterolemia.  She has been diagnosed with diabetes but is not on medications.  She is medically managed for hypertension.    PAST MEDICAL HISTORY:   Past Medical History:  Diagnosis Date   Anxiety    Arthritis    Asthma    Cataracts, bilateral    [name]   Complication of anesthesia    Note: pt has hx of TMJ " every now and then my jaw will lock "   COPD (chronic obstructive pulmonary disease) (HCC)    Coronary artery disease    a. s/p DES to mid-RCA in 05/2019   DM (diabetes mellitus) (HCC)    "came off medicines with dr's permission"   Family history of adverse reaction to anesthesia    " my son woke up crying "   Fatty liver    Fibromyalgia    GERD (gastroesophageal reflux disease)    Hyperlipidemia    stopped meds on her own   Hypothyroid    Myocardial infarction (HCC)    On supplemental oxygen  therapy    at night and PRN   Pneumonia    Shortness of breath    Stenosis of right carotid artery    TMJ (dislocation of temporomandibular joint)      FAMILY HISTORY:   Family History  Problem Relation Age of Onset   CAD Father     Anesthesia problems Neg Hx    Hypotension Neg Hx    Malignant hyperthermia Neg Hx    Pseudochol deficiency Neg Hx     SOCIAL HISTORY:   Social History   Tobacco Use   Smoking status: Former    Current packs/day: 0.00    Average packs/day: 0.8 packs/day for 55.0 years (41.3 ttl pk-yrs)    Types: Cigarettes, E-cigarettes    Start date: 09/1967    Quit date: 08/2022    Years since quitting: 1.4   Smokeless tobacco: Never  Substance Use Topics   Alcohol use: No     ALLERGIES:   No Known Allergies   CURRENT MEDICATIONS:   Current Outpatient Medications  Medication Sig Dispense Refill   aspirin  EC 81 MG EC tablet Take 1 tablet (81 mg total) by mouth daily. 30 tablet 3   atorvastatin  (LIPITOR ) 80 MG tablet TAKE ONE TABLET BY MOUTH AT 6PM. Strength: 80 mg 90 tablet 3   BREZTRI AEROSPHERE 160-9-4.8 MCG/ACT AERO Inhale 2 puffs into the lungs 2 (two) times daily.     Cholecalciferol (VITAMIN D3) 1.25 MG (50000 UT) CAPS Take 50,000 Units by mouth once a week.     guaiFENesin  (MUCINEX ) 600 MG  12 hr tablet Take 600 mg by mouth daily.     hydrochlorothiazide (HYDRODIURIL) 12.5 MG tablet Take 12.5 mg by mouth daily.     ipratropium-albuterol  (DUONEB) 0.5-2.5 (3) MG/3ML SOLN Take 3 mLs by nebulization every 4 (four) hours as needed (Wheezing/SOB).     levocetirizine (XYZAL) 5 MG tablet Take 5 mg by mouth daily.     LORazepam  (ATIVAN ) 1 MG tablet Take 1 tablet by mouth 4 (four) times daily as needed for anxiety.     metoprolol  tartrate (LOPRESSOR ) 50 MG tablet Take 1 tablet (50 mg total) by mouth 2 (two) times daily. 180 tablet 3   Naltrexone HCl, Pain, (NALTREX) 4.5 MG CAPS Take 4.5 mg by mouth daily.     nitroGLYCERIN  (NITROSTAT ) 0.4 MG SL tablet Place 1 tablet (0.4 mg total) under the tongue every 5 (five) minutes as needed for chest pain. 25 tablet 3   oseltamivir  (TAMIFLU ) 75 MG capsule Take 1 capsule (75 mg total) by mouth 2 (two) times daily. 8 capsule 0   potassium chloride   (KLOR-CON ) 10 MEQ tablet Take 1 tablet (10 mEq total) by mouth daily. 90 tablet 1   predniSONE  (DELTASONE ) 50 MG tablet Take 1 tablet (50 mg total) by mouth daily with breakfast. 5 tablet 0   PROAIR  HFA 108 (90 Base) MCG/ACT inhaler Inhale 1 puff into the lungs every 6 (six) hours as needed for wheezing or shortness of breath.      RESTASIS 0.05 % ophthalmic emulsion Place 1 drop into both eyes 2 (two) times daily as needed (Dry eye).     thyroid  (ARMOUR) 15 MG tablet Take 15 mg by mouth daily.     thyroid  (ARMOUR) 90 MG tablet Take 90 mg by mouth daily.     varenicline  (CHANTIX ) 1 MG tablet Take 1 mg by mouth daily.     No current facility-administered medications for this visit.    REVIEW OF SYSTEMS:   [X]  denotes positive finding, [ ]  denotes negative finding Cardiac  Comments:  Chest pain or chest pressure:    Shortness of breath upon exertion:    Short of breath when lying flat:    Irregular heart rhythm:        Vascular    Pain in calf, thigh, or hip brought on by ambulation:    Pain in feet at night that wakes you up from your sleep:     Blood clot in your veins:    Leg swelling:         Pulmonary    Oxygen  at home:    Productive cough:     Wheezing:         Neurologic    Sudden weakness in arms or legs:     Sudden numbness in arms or legs:     Sudden onset of difficulty speaking or slurred speech:    Temporary loss of vision in one eye:     Problems with dizziness:         Gastrointestinal    Blood in stool:     Vomited blood:         Genitourinary    Burning when urinating:     Blood in urine:        Psychiatric    Major depression:         Hematologic    Bleeding problems:    Problems with blood clotting too easily:        Skin    Rashes or ulcers:  Constitutional    Fever or chills:      PHYSICAL EXAM:   Vitals:   02/19/24 1447 02/19/24 1448  BP: (!) 147/77 (!) 147/77  Pulse: 76 76  Resp: 18 18  Temp: 97.8 F (36.6 C) 97.7 F (36.5  C)  TempSrc: Temporal Temporal  SpO2: 95% 94%  Weight: 194 lb (88 kg) 194 lb (88 kg)  Height: 5\' 7"  (1.702 m) 5\' 7"  (1.702 m)    GENERAL: The patient is a well-nourished female, in no acute distress. The vital signs are documented above. CARDIAC: There is a regular rate and rhythm.  VASCULAR: Palpable radial pulses bilaterally PULMONARY: Non-labored respirations MUSCULOSKELETAL: There are no major deformities or cyanosis. NEUROLOGIC: No focal weakness or paresthesias are detected. SKIN: There are no ulcers or rashes noted. PSYCHIATRIC: The patient has a normal affect.  STUDIES:   I have reviewed the following: Right         The ECA appears >50% stenosed. Patent CCA/ICA stent with no  Carotid:      stenosis.   Left Carotid: Velocities in the left ICA are consistent with a 1-39%  stenosis.               The ECA appears >50% stenosed.   Vertebrals:  Bilateral vertebral arteries demonstrate antegrade flow.  Subclavians: Normal flow hemodynamics were seen in bilateral subclavian               arteries.   MEDICAL ISSUES:   Carotid: Right carotid stent is widely patent.  The left side has minimal disease.  She will get routine surveillance and 1 year.  Possible vertebrobasilar insufficiency: The patient comes close to passing out when she raises her arms over her head.  Etiology of this is unclear, however I think that vertebrobasilar insufficiency should be ruled out.  The best way to do this is to look for vertebral artery stenosis on CT scan, which I have ordered.  I will schedule a virtual visit to go over the results    Gareld June, IV, MD, FACS Vascular and Vein Specialists of [facility] [phone] Pager [phone]

## 2024-02-21 ENCOUNTER — Encounter (HOSPITAL_COMMUNITY): Payer: Self-pay | Admitting: Radiology

## 2024-02-21 ENCOUNTER — Ambulatory Visit (HOSPITAL_COMMUNITY)
Admission: RE | Admit: 2024-02-21 | Discharge: 2024-02-21 | Disposition: A | Discharge status: Home / Self Care | Source: Ambulatory Visit | Attending: Surgery | Admitting: Surgery

## 2024-02-21 ENCOUNTER — Other Ambulatory Visit: Payer: Self-pay

## 2024-02-21 DIAGNOSIS — I6523 Occlusion and stenosis of bilateral carotid arteries: Secondary | ICD-10-CM | POA: Diagnosis not present

## 2024-02-21 DIAGNOSIS — J449 Chronic obstructive pulmonary disease, unspecified: Secondary | ICD-10-CM | POA: Diagnosis not present

## 2024-02-21 MED ORDER — IOHEXOL 350 MG/ML SOLN
75.0000 mL | Freq: Once | INTRAVENOUS | Status: AC | PRN
  Administered 2024-02-21: 75 mL via INTRAVENOUS

## 2024-03-04 ENCOUNTER — Ambulatory Visit: Attending: Surgery | Admitting: Surgery

## 2024-03-04 ENCOUNTER — Encounter: Payer: Self-pay | Admitting: Surgery

## 2024-03-04 DIAGNOSIS — I6523 Occlusion and stenosis of bilateral carotid arteries: Secondary | ICD-10-CM | POA: Diagnosis not present

## 2024-03-04 NOTE — Progress Notes (Signed)
 Vascular and Vein Specialist of [facility]  Patient name: Tabitha Schmidt MRN: [id number] DOB: Jun 01, 1956 Sex: female      Virtual Visit via Telephone Note   Because of Tabitha Schmidt's co-morbid illnesses, she is at least at moderate risk for complications without adequate follow up.  This format is felt to be most appropriate for this patient at this time.  The patient did not have access to video technology/had technical difficulties with video requiring transitioning to audio format only (telephone).  All issues noted in this document were discussed and addressed.  No physical exam could be performed with this format.  Patient Location: Home Provider Location: Home Office     REASON FOR APPOINTMENT:    Follow up  HISTORY OF PRESENT ILLNESS:   Tabitha Schmidt is a 67 y.o. female with asymptomatic right carotid stenosis.  She underwent TCAR on October 2 without incident.  She was found to have a 90% lesion.  Her postoperative course was uncomplicated.  She was a high risk candidate because of recent NSTEMI with DES treatment.   I saw her a few weeks ago and she was having some symptoms that I was concerned about being vertebrobasilar insufficiency.  I was unable to get a good look at her vertebral arteries and so I sent her for a CT scan.   The patient does take a statin for hypercholesterolemia.  She has been diagnosed with diabetes but is not on medications.  She is medically managed for hypertension.    PAST MEDICAL HISTORY    Past Medical History:  Diagnosis Date   Anxiety    Arthritis    Asthma    Cataracts, bilateral    Tabitha Schmidt   Complication of anesthesia    Note: pt has hx of TMJ " every now and then my jaw will lock "   COPD (chronic obstructive pulmonary disease) (HCC)    Coronary artery disease    a. s/p DES to mid-RCA in 05/2019   DM (diabetes mellitus) (HCC)    "came off medicines with dr's permission"   Family history of adverse reaction to  anesthesia    " my son woke up crying "   Fatty liver    Fibromyalgia    GERD (gastroesophageal reflux disease)    Hyperlipidemia    stopped meds on her own   Hypothyroid    Myocardial infarction (HCC)    On supplemental oxygen  therapy    at night and PRN   Pneumonia    Shortness of breath    Stenosis of right carotid artery    TMJ (dislocation of temporomandibular joint)      FAMILY HISTORY   Family History  Problem Relation Age of Onset   CAD Father    Anesthesia problems Neg Hx    Hypotension Neg Hx    Malignant hyperthermia Neg Hx    Pseudochol deficiency Neg Hx     SOCIAL HISTORY:   Social History   Socioeconomic History   Marital status: Single    Spouse name: Not on file   Number of children: Not on file   Years of education: Not on file   Highest education level: Not on file  Occupational History   Not on file  Tobacco Use   Smoking status: Former    Current packs/day: 0.00    Average packs/day: 0.8 packs/day for 55.0 years (41.3 ttl pk-yrs)  Types: Cigarettes, E-cigarettes    Start date: 09/1967    Quit date: 08/2022    Years since quitting: 1.5   Smokeless tobacco: Never  Vaping Use   Vaping status: Never Used  Substance and Sexual Activity   Alcohol use: No   Drug use: No    Types: Cocaine   Sexual activity: Yes    Birth control/protection: Surgical  Other Topics Concern   Not on file  Social History Narrative   Not on file   Social Drivers of Health   Financial Resource Strain: Not on file  Food Insecurity: No Food Insecurity (12/22/2023)   Hunger Vital Sign    Worried About Running Out of Food in the Last Year: Never true    Ran Out of Food in the Last Year: Never true  Transportation Needs: No Transportation Needs (12/22/2023)   PRAPARE - Administrator, Civil Service (Medical): No    Lack of Transportation (Non-Medical): No  Physical Activity: Not on file  Stress: Not on file  Social Connections: Unknown (12/22/2023)    Social Connection and Isolation Panel [NHANES]    Frequency of Communication with Friends and Family: Twice a week    Frequency of Social Gatherings with Friends and Family: Twice a week    Attends Religious Services: Never    Database administrator or Organizations: Yes    Attends Banker Meetings: Never    Marital Status: Patient declined  Catering manager Violence: Not At Risk (12/22/2023)   Humiliation, Afraid, Rape, and Kick questionnaire    Fear of Current or Ex-Partner: No    Emotionally Abused: No    Physically Abused: No    Sexually Abused: No    ALLERGIES:    No Known Allergies  CURRENT MEDICATIONS:    Current Outpatient Medications  Medication Sig Dispense Refill   aspirin  EC 81 MG EC tablet Take 1 tablet (81 mg total) by mouth daily. 30 tablet 3   atorvastatin  (LIPITOR ) 80 MG tablet TAKE ONE TABLET BY MOUTH AT 6PM. Strength: 80 mg 90 tablet 3   BREZTRI AEROSPHERE 160-9-4.8 MCG/ACT AERO Inhale 2 puffs into the lungs 2 (two) times daily.     Cholecalciferol (VITAMIN D3) 1.25 MG (50000 UT) CAPS Take 50,000 Units by mouth once a week.     guaiFENesin  (MUCINEX ) 600 MG 12 hr tablet Take 600 mg by mouth daily.     hydrochlorothiazide (HYDRODIURIL) 12.5 MG tablet Take 12.5 mg by mouth daily.     ipratropium-albuterol  (DUONEB) 0.5-2.5 (3) MG/3ML SOLN Take 3 mLs by nebulization every 4 (four) hours as needed (Wheezing/SOB).     levocetirizine (XYZAL) 5 MG tablet Take 5 mg by mouth daily.     LORazepam  (ATIVAN ) 1 MG tablet Take 1 tablet by mouth 4 (four) times daily as needed for anxiety.     metoprolol  tartrate (LOPRESSOR ) 50 MG tablet Take 1 tablet (50 mg total) by mouth 2 (two) times daily. 180 tablet 3   Naltrexone HCl, Pain, (NALTREX) 4.5 MG CAPS Take 4.5 mg by mouth daily.     nitroGLYCERIN  (NITROSTAT ) 0.4 MG SL tablet Place 1 tablet (0.4 mg total) under the tongue every 5 (five) minutes as needed for chest pain. 25 tablet 3   oseltamivir  (TAMIFLU ) 75 MG  capsule Take 1 capsule (75 mg total) by mouth 2 (two) times daily. 8 capsule 0   potassium chloride  (KLOR-CON ) 10 MEQ tablet Take 1 tablet (10 mEq total) by mouth daily. 90 tablet  1   predniSONE  (DELTASONE ) 50 MG tablet Take 1 tablet (50 mg total) by mouth daily with breakfast. 5 tablet 0   PROAIR  HFA 108 (90 Base) MCG/ACT inhaler Inhale 1 puff into the lungs every 6 (six) hours as needed for wheezing or shortness of breath.      RESTASIS 0.05 % ophthalmic emulsion Place 1 drop into both eyes 2 (two) times daily as needed (Dry eye).     thyroid  (ARMOUR) 15 MG tablet Take 15 mg by mouth daily.     thyroid  (ARMOUR) 90 MG tablet Take 90 mg by mouth daily.     varenicline  (CHANTIX ) 1 MG tablet Take 1 mg by mouth daily.     No current facility-administered medications for this visit.    REVIEW OF SYSTEMS:   Please see the history of present illness.     All other systems reviewed and are negative.  PHYSICAL EXAM:     Recent Labs: 12/22/2023: ALT 39; B Natriuretic Peptide 99.0 12/23/2023: BUN 17; Creatinine, Ser 0.53; Hemoglobin 13.9; Magnesium  2.0; Platelets 195; Potassium 3.7; Sodium 134   Recent Lipid Panel Lab Results  Component Value Date/Time   CHOL 181 05/14/2019 01:29 AM   TRIG 167 (H) 05/14/2019 01:29 AM   HDL 35 (L) 05/14/2019 01:29 AM   CHOLHDL 5.2 05/14/2019 01:29 AM   LDLCALC 113 (H) 05/14/2019 01:29 AM    Wt Readings from Last 3 Encounters:  02/19/24 194 lb (88 kg)  12/22/23 200 lb (90.7 kg)  04/28/23 203 lb (92.1 kg)     STUDIES:   I have reviewed her CT scan with the following results: 1. No flow reducing stenosis or irregularity in the subclavian or vertebral arteries to suggest vertebrobasilar insufficiency. 2. ~65% atheromatous narrowing at the left common carotid origin. Somewhat irregular plaque around the left common carotid bifurcation without flow reducing stenosis. 3. Widely patent right carotid stent.    ASSESSMENT and PLAN   We went over the  CT scan results.  There is no evidence of hemodynamically significant stenosis in the vertebrobasilar system to suggest vertebrobasilar insufficiency.  Therefore I think her spells are related to another issue, possibly ENT or cardiac.  I did see a 75% stenosis at the left common carotid artery origin.  I will have to get indirect evidence of this on ultrasound when she returns for surveillance in 1 year   Time:   Today, I have spent 7 minutes with the patient with telehealth technology discussing the above problems.      Marti Slates, MD, FACS Vascular and Vein Specialists of [facility] [phone] Pager [phone]

## 2024-03-11 ENCOUNTER — Ambulatory Visit: Admitting: Surgery

## 2024-03-13 ENCOUNTER — Ambulatory Visit (HOSPITAL_COMMUNITY)

## 2024-03-14 ENCOUNTER — Other Ambulatory Visit (HOSPITAL_COMMUNITY): Payer: Self-pay | Admitting: Acute Care

## 2024-03-14 ENCOUNTER — Ambulatory Visit (HOSPITAL_COMMUNITY)
Admission: RE | Admit: 2024-03-14 | Discharge: 2024-03-14 | Disposition: A | Discharge status: Home / Self Care | Source: Ambulatory Visit | Attending: Acute Care | Admitting: Acute Care

## 2024-03-14 DIAGNOSIS — Z122 Encounter for screening for malignant neoplasm of respiratory organs: Secondary | ICD-10-CM

## 2024-03-14 DIAGNOSIS — Z87891 Personal history of nicotine dependence: Secondary | ICD-10-CM | POA: Diagnosis not present

## 2024-03-22 ENCOUNTER — Ambulatory Visit: Payer: Self-pay

## 2024-03-22 DIAGNOSIS — J449 Chronic obstructive pulmonary disease, unspecified: Secondary | ICD-10-CM | POA: Diagnosis not present

## 2024-03-22 NOTE — Telephone Encounter (Signed)
Pt looking for results

## 2024-03-22 NOTE — Telephone Encounter (Signed)
  Chief Complaint: CT Lung Results Additional Notes: Pt calling inquiring about CT Lung results from 03/14/2024. Pt unsure if results have been finalized and wanted to check. Triager informed pt that results/imaging has been submitted, but not read as of yet. Triager will forward to [facility] for further guidance and will call her back with results. Patient verbalized understanding.   Additionally, triager offered to schedule TOC since Dr Thelda Finney no longer with [facility], but pt declined, wanting to research providers.   Copied from CRM [id number]. Topic: Clinical - Lab/Test Results >> Mar 22, 2024  1:30 PM Juliana Ocean wrote: Reason for CRM: pt was told her CT lung was read and sent to the dr.  But I do not see it has been read. She says someone is not telling truth. Reason for Disposition  [1] Caller requesting NON-URGENT health information AND [2] PCP's office is the best resource  Answer Assessment - Initial Assessment Questions 1. REASON FOR CALL or QUESTION: "What is your reason for calling today?" or "How can I best help you?" or "What question do you have that I can help answer?"     Calling for CT lung results on 5/15 - pt reports calling office and was told that office  Protocols used: Information Only Call - No Triage-A-AH

## 2024-03-26 NOTE — Telephone Encounter (Signed)
 Attempted to contact patient. Left VM for patient advising that we are still waiting for lung screening CT results to be finalized and that we will notify her once results are received. Call back number left if pt has further questions.

## 2024-04-05 ENCOUNTER — Other Ambulatory Visit: Payer: Self-pay

## 2024-04-05 DIAGNOSIS — Z87891 Personal history of nicotine dependence: Secondary | ICD-10-CM

## 2024-04-05 DIAGNOSIS — Z122 Encounter for screening for malignant neoplasm of respiratory organs: Secondary | ICD-10-CM

## 2024-04-15 DIAGNOSIS — X32XXXD Exposure to sunlight, subsequent encounter: Secondary | ICD-10-CM | POA: Diagnosis not present

## 2024-04-15 DIAGNOSIS — L570 Actinic keratosis: Secondary | ICD-10-CM | POA: Diagnosis not present

## 2024-04-15 DIAGNOSIS — B078 Other viral warts: Secondary | ICD-10-CM | POA: Diagnosis not present

## 2024-04-22 DIAGNOSIS — J449 Chronic obstructive pulmonary disease, unspecified: Secondary | ICD-10-CM | POA: Diagnosis not present

## 2024-04-28 ENCOUNTER — Other Ambulatory Visit: Payer: Self-pay | Admitting: Cardiovascular Disease

## 2024-05-20 NOTE — Progress Notes (Unsigned)
 CARDIOLOGY CONSULT NOTE       Patient ID: Tabitha Schmidt MRN: [id number] DOB/AGE: July 26, 1956 68 y.o.  Admit date: (Not on file) Referring Physician: Shona Primary Physician: Shona Norleen PEDLAR, MD Primary Cardiologist: Charls -> Favor Kreh F/U  Palpitations   HPI:  68 y.o. referred by Dr Shona for palpitations 02/10/21 . She has been followed in past by [facility]. She has a history of right carotid stenting Known CAD with cath 05/18/19 for NSTEMI showing occluded mid/distal RCA stented with good results and no disease in left system EF 50-55% Last carotid duplex 01/2023  with patent right stent and 1-39 % Left ICA stenosis patent stent in [facility]   She is still smoking  I see her son She sees endocrine for hypothyroidism on replacement Her TSH has been suppressed repeatedly over the last year   She has an 42 yo grand daughter adopted living with her with severe mental health issues  Depression bipolar suicidal at times   Stopped smoking 08/2022 Lung cancer CT 03/13/21 suspicious subpleural nodular opacity in posterior apical RUL Consider PET/ CT in 3 months Repeat CT stable 04/04/24  and seeing Dr Brenna in pulmonary    Seen in ED 01/07/22 with dyspnea COVID positive Given Paxlovid She has had vaccines and booster COPD on oxygen  at night 2L's Rx steroids/ nebs CXR NAD just chronic emphysema BNP only 124  Has not smoked using Chantix  No TIA symptoms no angina Compliant with meds   No angina has cats/dogs at home but doesn't travel much   On 3L's oxygen  at home admitted to hospital 12/22/23 for COPD exacerbation Quit smoking 09/2023 Positive for influenze Rx with steroids and oseltamivir    She has been having some ? Dysphagia or inability to swallow Also some issues with feeling head rush then weakness These episodes not related to dyspnea, chest pain palpitations, standing She feels week after them I told her I was not sure what the latter symptoms were but not likely to be heart. She should see primary/GI and  have swallowing study/EGD  ROS All other systems reviewed and negative except as noted above  Past Medical History:  Diagnosis Date   Anxiety    Arthritis    Asthma    Cataracts, bilateral    Roz   Complication of anesthesia    Note: pt has hx of TMJ  every now and then my jaw will lock    COPD (chronic obstructive pulmonary disease) (HCC)    Coronary artery disease    a. s/p DES to mid-RCA in 05/2019   DM (diabetes mellitus) (HCC)    came off medicines with dr's permission   Family history of adverse reaction to anesthesia     my son woke up crying    Fatty liver    Fibromyalgia    GERD (gastroesophageal reflux disease)    Hyperlipidemia    stopped meds on her own   Hypothyroid    Myocardial infarction (HCC)    On supplemental oxygen  therapy    at night and PRN   Pneumonia    Shortness of breath    Stenosis of right carotid artery    TMJ (dislocation of temporomandibular joint)     Family History  Problem Relation Age of Onset   CAD Father    Anesthesia problems Neg Hx    Hypotension Neg Hx    Malignant hyperthermia Neg Hx    Pseudochol deficiency Neg Hx     Social History   Socioeconomic  History   Marital status: Single    Spouse name: Not on file   Number of children: Not on file   Years of education: Not on file   Highest education level: Not on file  Occupational History   Not on file  Tobacco Use   Smoking status: Former    Current packs/day: 0.00    Average packs/day: 0.8 packs/day for 55.0 years (41.3 ttl pk-yrs)    Types: Cigarettes, E-cigarettes    Start date: 09/1967    Quit date: 08/2022    Years since quitting: 1.7   Smokeless tobacco: Never  Vaping Use   Vaping status: Never Used  Substance and Sexual Activity   Alcohol use: No   Drug use: No    Types: Cocaine   Sexual activity: Yes    Birth control/protection: Surgical  Other Topics Concern   Not on file  Social History Narrative   Not on file   Social Drivers of  Health   Financial Resource Strain: Not on file  Food Insecurity: No Food Insecurity (12/22/2023)   Hunger Vital Sign    Worried About Running Out of Food in the Last Year: Never true    Ran Out of Food in the Last Year: Never true  Transportation Needs: No Transportation Needs (12/22/2023)   PRAPARE - Administrator, Civil Service (Medical): No    Lack of Transportation (Non-Medical): No  Physical Activity: Not on file  Stress: Not on file  Social Connections: Unknown (12/22/2023)   Social Connection and Isolation Panel    Frequency of Communication with Friends and Family: Twice a week    Frequency of Social Gatherings with Friends and Family: Twice a week    Attends Religious Services: Never    Database administrator or Organizations: Yes    Attends Banker Meetings: Never    Marital Status: Patient declined  Catering manager Violence: Not At Risk (12/22/2023)   Humiliation, Afraid, Rape, and Kick questionnaire    Fear of Current or Ex-Partner: No    Emotionally Abused: No    Physically Abused: No    Sexually Abused: No    Past Surgical History:  Procedure Laterality Date   ABDOMINAL HYSTERECTOMY     BREAST SURGERY     removal of bilateral benign tumors   CARDIAC CATHETERIZATION     2010   CHOLECYSTECTOMY     CORONARY STENT INTERVENTION N/A 05/18/2019   Procedure: CORONARY STENT INTERVENTION;  Surgeon: Court Dorn PARAS, MD;  Location: [facility];  Service: Cardiovascular;  Laterality: N/A;   DILATION AND CURETTAGE OF UTERUS     ESOPHAGOGASTRODUODENOSCOPY  01/26/2012   Procedure: ESOPHAGOGASTRODUODENOSCOPY (EGD);  Surgeon: Claudis RAYMOND Rivet, MD;  Location: [facility];  Service: Endoscopy;  Laterality: N/A;  730   LEFT HEART CATH AND CORONARY ANGIOGRAPHY N/A 05/18/2019   Procedure: LEFT HEART CATH AND CORONARY ANGIOGRAPHY;  Surgeon: Court Dorn PARAS, MD;  Location: [facility];  Service: Cardiovascular;  Laterality: N/A;   TRANSCAROTID  ARTERY REVASCULARIZATION  Right 08/02/2019   Procedure: TRANSCAROTID ARTERY REVASCULARIZATION RIGHT;  Surgeon: Serene Gaile ORN, MD;  Location: [facility] OR;  Service: Vascular;  Laterality: Right;   TUBAL LIGATION        Current Outpatient Medications:    aspirin  EC 81 MG EC tablet, Take 1 tablet (81 mg total) by mouth daily., Disp: 30 tablet, Rfl: 3   atorvastatin  (LIPITOR ) 80 MG tablet, TAKE ONE TABLET BY MOUTH  AT 6PM. Strength: 80 mg, Disp: 90 tablet, Rfl: 3   BREZTRI AEROSPHERE 160-9-4.8 MCG/ACT AERO, Inhale 2 puffs into the lungs 2 (two) times daily., Disp: , Rfl:    Cholecalciferol (VITAMIN D3) 1.25 MG (50000 UT) CAPS, Take 50,000 Units by mouth once a week., Disp: , Rfl:    guaiFENesin  (MUCINEX ) 600 MG 12 hr tablet, Take 600 mg by mouth daily., Disp: , Rfl:    hydrochlorothiazide (HYDRODIURIL) 12.5 MG tablet, Take 12.5 mg by mouth daily., Disp: , Rfl:    ipratropium-albuterol  (DUONEB) 0.5-2.5 (3) MG/3ML SOLN, Take 3 mLs by nebulization every 4 (four) hours as needed (Wheezing/SOB)., Disp: , Rfl:    levocetirizine (XYZAL) 5 MG tablet, Take 5 mg by mouth daily., Disp: , Rfl:    LORazepam  (ATIVAN ) 1 MG tablet, Take 1 tablet by mouth 4 (four) times daily as needed for anxiety., Disp: , Rfl:    metoprolol  tartrate (LOPRESSOR ) 50 MG tablet, Take 1 tablet (50 mg total) by mouth 2 (two) times daily., Disp: 180 tablet, Rfl: 3   Naltrexone HCl, Pain, (NALTREX) 4.5 MG CAPS, Take 4.5 mg by mouth daily., Disp: , Rfl:    nitroGLYCERIN  (NITROSTAT ) 0.4 MG SL tablet, Place 1 tablet (0.4 mg total) under the tongue every 5 (five) minutes as needed for chest pain., Disp: 25 tablet, Rfl: 3   oseltamivir  (TAMIFLU ) 75 MG capsule, Take 1 capsule (75 mg total) by mouth 2 (two) times daily., Disp: 8 capsule, Rfl: 0   potassium chloride  (KLOR-CON ) 10 MEQ tablet, Take 1 tablet (10 mEq total) by mouth daily., Disp: 90 tablet, Rfl: 1   predniSONE  (DELTASONE ) 50 MG tablet, Take 1 tablet (50 mg total) by mouth daily with  breakfast., Disp: 5 tablet, Rfl: 0   PROAIR  HFA 108 (90 Base) MCG/ACT inhaler, Inhale 1 puff into the lungs every 6 (six) hours as needed for wheezing or shortness of breath. , Disp: , Rfl:    RESTASIS 0.05 % ophthalmic emulsion, Place 1 drop into both eyes 2 (two) times daily as needed (Dry eye)., Disp: , Rfl:    thyroid  (ARMOUR) 15 MG tablet, Take 15 mg by mouth daily., Disp: , Rfl:    thyroid  (ARMOUR) 90 MG tablet, Take 90 mg by mouth daily., Disp: , Rfl:    varenicline  (CHANTIX ) 1 MG tablet, Take 1 mg by mouth daily., Disp: , Rfl:     Physical Exam: There were no vitals taken for this visit.    Affect appropriate Healthy:  appears stated age HEENT: normal Neck supple with no adenopathy JVP normal no bruits no thyromegaly Lungs clear with no wheezing and good diaphragmatic motion Heart:  S1/S2 no murmur, no rub, gallop or click PMI normal Abdomen: benighn, BS positve, no tenderness, no AAA no bruit.  No HSM or HJR Distal pulses intact with no bruits No edema Neuro non-focal Skin warm and dry No muscular weakness   Labs:   Lab Results  Component Value Date   WBC 6.0 12/23/2023   HGB 13.9 12/23/2023   HCT 42.2 12/23/2023   MCV 93.2 12/23/2023   PLT 195 12/23/2023   No results for input(s): NA, K, CL, CO2, BUN, CREATININE, CALCIUM , PROT, BILITOT, ALKPHOS, ALT, AST, GLUCOSE in the last 168 hours.  Invalid input(s): LABALBU No results found for: CKTOTAL, CKMB, CKMBINDEX, TROPONINI  Lab Results  Component Value Date   CHOL 181 05/14/2019   Lab Results  Component Value Date   HDL 35 (L) 05/14/2019   Lab Results  Component  Value Date   LDLCALC 113 (H) 05/14/2019   Lab Results  Component Value Date   TRIG 167 (H) 05/14/2019   Lab Results  Component Value Date   CHOLHDL 5.2 05/14/2019   No results found for: LDLDIRECT    Radiology: No results found.  EKG: 05/23/2024 SR rate 67 normal    ASSESSMENT AND PLAN:   1.  CAD:  Stenting of occluded mid/distal RCA July 2020 Continue ASA/statin/beta blocker No residual left sided disease no need for stress testing now  2. HLD on statin labs with primary  3. Smoking:  Recent hospitalization for Influenza. ON 3L's oxygen  at baseline.  F/U with Dr Brenna in pulmonary CT stable 04/04/24  4. PVD: post stenting right ICA Duplex 01/23/23 patent with no obstructive ICS dx bilaterally F/U Dr Serene Duplex 02/19/24 patent stent and 1-39% left ICA stenosis.  5. Thyroid :  TSH with primary has been suppressed no recent labs in Epic 6. Palpitations: benign monitor 05/12/22 max 15 beats artrial tachycardia and < 1% PAC/PVCls continue lopressor   7. Dysphagia: f/u primary ? Swallowing study or EGD   Carotid duplex April 2026  F/U in a year    Signed: Maude Emmer 05/23/2024, 1:20 PM

## 2024-05-22 DIAGNOSIS — J449 Chronic obstructive pulmonary disease, unspecified: Secondary | ICD-10-CM | POA: Diagnosis not present

## 2024-05-23 ENCOUNTER — Ambulatory Visit: Attending: Cardiovascular Disease | Admitting: Cardiovascular Disease

## 2024-05-23 ENCOUNTER — Encounter: Payer: Self-pay | Admitting: Cardiovascular Disease

## 2024-05-23 VITALS — BP 146/76 | HR 80 | Ht 67.0 in | Wt 208.8 lb

## 2024-05-23 DIAGNOSIS — R002 Palpitations: Secondary | ICD-10-CM

## 2024-05-23 DIAGNOSIS — I2510 Atherosclerotic heart disease of native coronary artery without angina pectoris: Secondary | ICD-10-CM

## 2024-05-23 DIAGNOSIS — Z95828 Presence of other vascular implants and grafts: Secondary | ICD-10-CM

## 2024-05-23 DIAGNOSIS — E785 Hyperlipidemia, unspecified: Secondary | ICD-10-CM

## 2024-05-23 NOTE — Patient Instructions (Signed)
 Medication Instructions:  Your physician recommends that you continue on your current medications as directed. Please refer to the Current Medication list given to you today.   Labwork: None today  Testing/Procedures: None today  Follow-Up: 6 months  Any Other Special Instructions Will Be Listed Below (If Applicable).  If you need a refill on your cardiac medications before your next appointment, please call your pharmacy.

## 2024-06-04 ENCOUNTER — Encounter (INDEPENDENT_AMBULATORY_CARE_PROVIDER_SITE_OTHER): Payer: Self-pay | Admitting: *Deleted

## 2024-06-05 ENCOUNTER — Emergency Department (HOSPITAL_COMMUNITY)

## 2024-06-05 ENCOUNTER — Other Ambulatory Visit: Payer: Self-pay

## 2024-06-05 ENCOUNTER — Encounter (HOSPITAL_COMMUNITY): Payer: Self-pay | Admitting: *Deleted

## 2024-06-05 ENCOUNTER — Emergency Department (HOSPITAL_COMMUNITY)
Admission: EM | Admit: 2024-06-05 | Discharge: 2024-06-05 | Disposition: A | Discharge status: Home / Self Care | Attending: Emergency Medicine | Admitting: Emergency Medicine

## 2024-06-05 DIAGNOSIS — R079 Chest pain, unspecified: Secondary | ICD-10-CM | POA: Diagnosis not present

## 2024-06-05 DIAGNOSIS — Z79899 Other long term (current) drug therapy: Secondary | ICD-10-CM | POA: Diagnosis not present

## 2024-06-05 DIAGNOSIS — J449 Chronic obstructive pulmonary disease, unspecified: Secondary | ICD-10-CM | POA: Diagnosis not present

## 2024-06-05 DIAGNOSIS — K760 Fatty (change of) liver, not elsewhere classified: Secondary | ICD-10-CM | POA: Diagnosis not present

## 2024-06-05 DIAGNOSIS — I10 Essential (primary) hypertension: Secondary | ICD-10-CM | POA: Insufficient documentation

## 2024-06-05 DIAGNOSIS — J432 Centrilobular emphysema: Secondary | ICD-10-CM | POA: Diagnosis not present

## 2024-06-05 DIAGNOSIS — I2510 Atherosclerotic heart disease of native coronary artery without angina pectoris: Secondary | ICD-10-CM | POA: Insufficient documentation

## 2024-06-05 DIAGNOSIS — Z7982 Long term (current) use of aspirin: Secondary | ICD-10-CM | POA: Diagnosis not present

## 2024-06-05 DIAGNOSIS — I700 Atherosclerosis of aorta: Secondary | ICD-10-CM | POA: Diagnosis not present

## 2024-06-05 DIAGNOSIS — R0789 Other chest pain: Secondary | ICD-10-CM | POA: Diagnosis not present

## 2024-06-05 LAB — CBC
HCT: 42 % (ref 36.0–46.0)
Hemoglobin: 13.5 g/dL (ref 12.0–15.0)
MCH: 30.1 pg (ref 26.0–34.0)
MCHC: 32.1 g/dL (ref 30.0–36.0)
MCV: 93.5 fL (ref 80.0–100.0)
Platelets: 287 K/uL (ref 150–400)
RBC: 4.49 MIL/uL (ref 3.87–5.11)
RDW: 12.9 % (ref 11.5–15.5)
WBC: 9 K/uL (ref 4.0–10.5)
nRBC: 0 % (ref 0.0–0.2)

## 2024-06-05 LAB — HEPATIC FUNCTION PANEL
ALT: 37 U/L (ref 0–44)
AST: 32 U/L (ref 15–41)
Albumin: 3.7 g/dL (ref 3.5–5.0)
Alkaline Phosphatase: 82 U/L (ref 38–126)
Bilirubin, Direct: 0.1 mg/dL (ref 0.0–0.2)
Indirect Bilirubin: 0.4 mg/dL (ref 0.3–0.9)
Total Bilirubin: 0.5 mg/dL (ref 0.0–1.2)
Total Protein: 7.3 g/dL (ref 6.5–8.1)

## 2024-06-05 LAB — LIPASE, BLOOD: Lipase: 50 U/L (ref 11–51)

## 2024-06-05 LAB — BASIC METABOLIC PANEL WITH GFR
Anion gap: 12 (ref 5–15)
BUN: 14 mg/dL (ref 8–23)
CO2: 27 mmol/L (ref 22–32)
Calcium: 9.3 mg/dL (ref 8.9–10.3)
Chloride: 99 mmol/L (ref 98–111)
Creatinine, Ser: 0.76 mg/dL (ref 0.44–1.00)
GFR, Estimated: >60 mL/min (ref >60)
Glucose, Bld: 146 mg/dL — ABNORMAL HIGH (ref 70–99)
Potassium: 4.2 mmol/L (ref 3.5–5.1)
Sodium: 138 mmol/L (ref 135–145)

## 2024-06-05 LAB — TROPONIN I (HIGH SENSITIVITY)
Troponin I (High Sensitivity): 4 ng/L (ref <18)
Troponin I (High Sensitivity): 4 ng/L (ref <18)

## 2024-06-05 LAB — MAGNESIUM: Magnesium: 2 mg/dL (ref 1.7–2.4)

## 2024-06-05 MED ORDER — NITROGLYCERIN 2 % TD OINT
1.0000 [in_us] | TOPICAL_OINTMENT | Freq: Once | TRANSDERMAL | Status: AC
  Administered 2024-06-05: 1 [in_us] via TOPICAL
  Filled 2024-06-05: qty 1

## 2024-06-05 MED ORDER — ASPIRIN 81 MG PO CHEW
243.0000 mg | CHEWABLE_TABLET | Freq: Once | ORAL | Status: AC
  Administered 2024-06-05: 243 mg via ORAL
  Filled 2024-06-05: qty 3

## 2024-06-05 MED ORDER — IOHEXOL 350 MG/ML SOLN
100.0000 mL | Freq: Once | INTRAVENOUS | Status: AC | PRN
  Administered 2024-06-05: 100 mL via INTRAVENOUS

## 2024-06-05 NOTE — ED Notes (Signed)
 See triage notes. States the cp woke her. States pain would get better with ntg but would come right back. Denies radiation. Pt color wnl and non diaphoretic. Denies shob and none noted.

## 2024-06-05 NOTE — ED Triage Notes (Signed)
 Pt woke up with mid to left CP this morning.  Pt states she has taken two SL NTG at home with minimal relief.

## 2024-06-05 NOTE — ED Notes (Signed)
 Patient transported to CT

## 2024-06-05 NOTE — ED Notes (Signed)
 Pt ambulated to restroom with steady gait.

## 2024-06-05 NOTE — ED Notes (Signed)
 Pt difficult stick. Second RN to attempt.

## 2024-06-05 NOTE — Discharge Instructions (Signed)
 Your test results today were reassuring.  Continue your home medications as prescribed.  Follow-up with your cardiologist.  Return to the emergency department for any new or worsening symptoms of concern.

## 2024-06-05 NOTE — ED Provider Notes (Signed)
 [place] EMERGENCY DEPARTMENT AT [facility] Provider Note   CSN: [id number] Arrival date & time: 06/05/24  1601     Patient presents with: Chest Pain   Tabitha Schmidt is a 68 y.o. female.    Chest Pain Associated symptoms: back pain   Patient presents for left-sided chest pain.  Medical history includes CAD, COPD, GERD, anxiety, depression, HTN.  She had stent placement 5 years ago.  Her cardiologist is Dr.Nishan.  This morning, she woke up with left-sided chest pain.  Discomfort has been persistent throughout the day.  It did improve slightly with SL NTG.  She took 2 of these.  Currently, she takes one 81 mg aspirin  daily.  She did take a dose this morning.  Pain has at times radiated to left jaw and thoracic back.  She tried Rolaids without relief.     Prior to Admission medications   Medication Sig Start Date End Date Taking? Authorizing Provider  aspirin  EC 81 MG EC tablet Take 1 tablet (81 mg total) by mouth daily. 05/15/19  Yes Ricky Fines, MD  atorvastatin  (LIPITOR ) 80 MG tablet TAKE ONE TABLET BY MOUTH AT 6PM. Strength: 80 mg 06/08/23  Yes Nishan, Peter C, MD  BREZTRI AEROSPHERE 160-9-4.8 MCG/ACT AERO Inhale 2 puffs into the lungs 2 (two) times daily. 03/19/20  Yes [provider]  Cholecalciferol (VITAMIN D3) 1.25 MG (50000 UT) CAPS Take 50,000 Units by mouth every Wednesday.   Yes [provider]  Dihydroxyaluminum Sod Carb (ROLAIDS PO) Take 2 tablets by mouth as needed (acid reflux, heartburn).   Yes [provider]  docusate sodium  (COLACE) 100 MG capsule Take 200 mg by mouth daily as needed for mild constipation.   Yes [provider]  hydrochlorothiazide (HYDRODIURIL) 12.5 MG tablet Take 12.5 mg by mouth daily. 01/11/22  Yes [provider]  ipratropium-albuterol  (DUONEB) 0.5-2.5 (3) MG/3ML SOLN Take 3 mLs by nebulization every 4 (four) hours as needed (Wheezing/SOB). 10/23/23  Yes [provider]  LORazepam   (ATIVAN ) 1 MG tablet Take 1 tablet by mouth 4 (four) times daily as needed for anxiety. 11/29/23  Yes [provider]  metoprolol  tartrate (LOPRESSOR ) 50 MG tablet Take 1 tablet (50 mg total) by mouth 2 (two) times daily. 06/08/23 06/05/24 Yes Nishan, Peter C, MD  Naltrexone HCl, Pain, (NALTREX) 4.5 MG CAPS Take 4.5 mg by mouth daily.   Yes [provider]  nitroGLYCERIN  (NITROSTAT ) 0.4 MG SL tablet Place 1 tablet (0.4 mg total) under the tongue every 5 (five) minutes as needed for chest pain. 02/10/21 06/05/24 Yes Nishan, Peter C, MD  OREGANO PO Take 1 capsule by mouth daily.   Yes [provider]  potassium chloride  (KLOR-CON ) 10 MEQ tablet Take 1 tablet (10 mEq total) by mouth daily. 04/30/24  Yes Delford Maude BROCKS, MD  PROAIR  HFA 108 (90 Base) MCG/ACT inhaler Inhale 1 puff into the lungs every 6 (six) hours as needed for wheezing or shortness of breath.  01/15/19  Yes [provider]  RESTASIS 0.05 % ophthalmic emulsion Place 1 drop into both eyes 2 (two) times daily as needed (Dry eye). 01/27/21  Yes [provider]  thyroid  (ARMOUR) 15 MG tablet Take 15 mg by mouth daily.   Yes [provider]  thyroid  (ARMOUR) 90 MG tablet Take 90 mg by mouth daily.   Yes [provider]  varenicline  (CHANTIX ) 1 MG tablet Take 1 mg by mouth daily. 04/13/23  Yes [provider]  levocetirizine (XYZAL) 5 MG tablet Take 5 mg by mouth daily. Patient not taking: Reported on 06/05/2024    [provider]    Allergies: Patient has no known allergies.    Review of Systems  Cardiovascular:  Positive for chest pain.  Musculoskeletal:  Positive for back pain.  All other systems reviewed and are negative.   Updated Vital Signs BP 128/66   Pulse 74   Temp 97.7 F (36.5 C) (Oral)   Resp 15   Ht 5' 7 (1.702 m)   Wt 93 kg   SpO2 94%   BMI 32.11 kg/m   Physical Exam Vitals and nursing note reviewed.  Constitutional:      General: She is not  in acute distress.    Appearance: She is well-developed. She is not ill-appearing, toxic-appearing or diaphoretic.  HENT:     Head: Normocephalic and atraumatic.  Eyes:     Conjunctiva/sclera: Conjunctivae normal.  Cardiovascular:     Rate and Rhythm: Normal rate and regular rhythm.  Pulmonary:     Effort: Pulmonary effort is normal. No tachypnea.     Breath sounds: Normal breath sounds.  Chest:     Chest wall: No tenderness.  Abdominal:     Palpations: Abdomen is soft.     Tenderness: There is no abdominal tenderness.  Musculoskeletal:        General: No swelling. Normal range of motion.     Cervical back: Normal range of motion and neck supple.  Skin:    General: Skin is warm and dry.     Coloration: Skin is not cyanotic or pale.  Neurological:     General: No focal deficit present.     Mental Status: She is alert and oriented to person, place, and time.  Psychiatric:        Mood and Affect: Mood normal.        Behavior: Behavior normal.     (all labs ordered are listed, but only abnormal results are displayed) Labs Reviewed  BASIC METABOLIC PANEL WITH GFR - Abnormal; Notable for the following components:      Result Value   Glucose, Bld 146 (*)    All other components within normal limits  CBC  LIPASE, BLOOD  HEPATIC FUNCTION PANEL  MAGNESIUM   TROPONIN I (HIGH SENSITIVITY)  TROPONIN I (HIGH SENSITIVITY)    EKG: EKG Interpretation Date/Time:  Wednesday June 05 2024 16:13:54 EDT Ventricular Rate:  73 PR Interval:  150 QRS Duration:  72 QT Interval:  398 QTC Calculation: 438 R Axis:   46  Text Interpretation: Normal sinus rhythm Confirmed by Melvenia Motto ([id number]) on 06/05/2024 4:50:30 PM  Radiology: CT Angio Chest/Abd/Pel for Dissection W and/or Wo Contrast Result Date: 06/05/2024 CLINICAL DATA:  Mid to left chest pain this morning. Acute aortic syndrome suspected EXAM: CT ANGIOGRAPHY CHEST, ABDOMEN AND PELVIS TECHNIQUE: Non-contrast CT of the chest was initially  obtained. Multidetector CT imaging through the chest, abdomen and pelvis was performed using the standard protocol during bolus administration of intravenous contrast. Multiplanar reconstructed images and MIPs were obtained and reviewed to evaluate the vascular anatomy. RADIATION DOSE REDUCTION: This exam was performed according to the departmental dose-optimization program which includes automated exposure control, adjustment of the mA and/or kV according to patient size and/or use of iterative reconstruction technique. CONTRAST:  OMNIPAQUE  IOHEXOL  350 MG/ML SOLN COMPARISON:  Same day chest radiograph and CT chest 03/14/2024 FINDINGS: CTA CHEST FINDINGS Cardiovascular: No pericardial effusion. Normal heart size. Coronary artery  and aortic atherosclerotic calcification. No acute aortic syndrome. No central pulmonary embolism. Mediastinum/Nodes: Trachea and esophagus are unremarkable. No thoracic adenopathy. Lungs/Pleura: Centrilobular and paraseptal emphysema in the upper lobes. Biapical pleuroparenchymal scarring. No focal consolidation, pleural effusion, or pneumothorax. New 4 mm nodule in the left lower lobe on series 7/image 88. Musculoskeletal: No acute fracture. Review of the MIP images confirms the above findings. CTA ABDOMEN AND PELVIS FINDINGS VASCULAR Atherosclerotic calcification of the aorta and its mesenteric, renal, and iliac artery branches. No aneurysm or dissection. Moderate right and severe left renal artery narrowing. Review of the MIP images confirms the above findings. NON-VASCULAR Hepatobiliary: Hepatic steatosis.  Cholecystectomy. Pancreas: Unremarkable. Spleen: Unremarkable. Adrenals/Urinary Tract: Normal adrenal glands. No urinary calculi or hydronephrosis. Unremarkable bladder. Stomach/Bowel: Normal caliber large and small bowel. No bowel wall thickening. Normal stomach and appendix. Lymphatic: No lymphadenopathy. Reproductive: Hysterectomy. Other: No free intraperitoneal fluid or  air. Musculoskeletal: No acute fracture. Review of the MIP images confirms the above findings. IMPRESSION: 1. No acute aortic syndrome. 2. No acute abnormality in the chest, abdomen, or pelvis. 3. Hepatic steatosis. 4. Moderate right and severe left renal artery narrowing. 5. New 4 mm nodule in the left lower lobe. If patient is low risk for malignancy, no routine follow-up imaging is recommended. If patient is high risk for malignancy, a non-contrast chest CT at 12 months is optional.This recommendation follows the consensus statement: Guidelines for Management of Incidental Pulmonary Nodules Detected on CT Images: From the Fleischner Society 2017; Radiology 2017; [phone]. Aortic Atherosclerosis (ICD10-I70.0) and Emphysema (ICD10-J43.9). Electronically Signed   By: Norman Gatlin M.D.   On: 06/05/2024 20:25   DG Chest 2 View Result Date: 06/05/2024 CLINICAL DATA:  Chest pain. EXAM: CHEST - 2 VIEW COMPARISON:  CT chest dated 03/14/2024. FINDINGS: The heart size and mediastinal contours are within normal limits. Aortic atherosclerosis. Biapical pleuroparenchymal scarring. No focal consolidation, pleural effusion, or pneumothorax. No acute osseous abnormality. IMPRESSION: 1. No acute cardiopulmonary findings. 2.  Aortic Atherosclerosis (ICD10-I70.0). Electronically Signed   By: Harrietta Sherry M.D.   On: 06/05/2024 16:45     Procedures   Medications Ordered in the ED  nitroGLYCERIN  (NITROGLYN) 2 % ointment 1 inch (1 inch Topical Given 06/05/24 1745)  aspirin  chewable tablet 243 mg (243 mg Oral Given 06/05/24 1712)  iohexol  (OMNIPAQUE ) 350 MG/ML injection 100 mL (100 mLs Intravenous Contrast Given 06/05/24 1941)                                    Medical Decision Making Amount and/or Complexity of Data Reviewed Labs: ordered. Radiology: ordered.  Risk OTC drugs. Prescription drug management.   This patient presents to the ED for concern of chest pain, this involves an extensive number of  treatment options, and is a complaint that carries with it a high risk of complications and morbidity.  The differential diagnosis includes ACS, GERD, pericarditis, anxiety, PE, dissection, pancreatitis, PUD   Co morbidities / Chronic conditions that complicate the patient evaluation  CAD, COPD, GERD, anxiety, depression, HTN   Additional history obtained:  Additional history obtained from EMR External records from outside source obtained and reviewed including N/A   Lab Tests:  I Ordered, and personally interpreted labs.  The pertinent results include: Normal hemoglobin, no leukocytosis, normal kidney function, normal electrolytes, normal troponins x 2   Imaging Studies ordered:  I ordered imaging studies including UTI dissection study I independently  visualized and interpreted imaging which showed no acute findings I agree with the radiologist interpretation   Cardiac Monitoring: / EKG:  The patient was maintained on a cardiac monitor.  I personally viewed and interpreted the cardiac monitored which showed an underlying rhythm of: Sinus rhythm   Problem List / ED Course / Critical interventions / Medication management  Patient presenting for chest pain.  Onset was this morning upon awakening.  Pain has at times radiated to left jaw and mid back.  On arrival in the ED, vital signs are normal.  Patient is well-appearing on exam.  She states that her discomfort is currently mild and declines any pain medication.  EKG does not show any concerning ST segment abnormalities.  ASA was ordered for possible ACS.  Given her improvement with NTG earlier, NTG ointment was ordered.  Workup was initiated.  Lab work and imaging studies were unremarkable.  Patient continued to have minimal symptoms while in the ED.  She was given reassurance and discharged in stable condition. I ordered medication including NTG, ASA for possible ACS Reevaluation of the patient after these medicines showed that  the patient stayed the same I have reviewed the patients home medicines and have made adjustments as needed   Social Determinants of Health:  Has access to outpatient care     Final diagnoses:  Chest pain, unspecified type    ED Discharge Orders          Ordered    Ambulatory referral to Cardiology       Comments: If you have not heard from the Cardiology office within the next 72 hours please call [phone].   06/05/24 2105               Melvenia Motto, MD 06/05/24 2106

## 2024-06-06 ENCOUNTER — Other Ambulatory Visit: Payer: Self-pay | Admitting: *Deleted

## 2024-06-06 ENCOUNTER — Telehealth: Payer: Self-pay | Admitting: Cardiovascular Disease

## 2024-06-06 MED ORDER — METOPROLOL TARTRATE 50 MG PO TABS: 50.0000 mg | ORAL_TABLET | Freq: Two times a day (BID) | ORAL | 3 refills | Status: AC

## 2024-06-11 DIAGNOSIS — R0789 Other chest pain: Secondary | ICD-10-CM | POA: Diagnosis not present

## 2024-06-11 DIAGNOSIS — R911 Solitary pulmonary nodule: Secondary | ICD-10-CM | POA: Diagnosis not present

## 2024-06-11 DIAGNOSIS — K219 Gastro-esophageal reflux disease without esophagitis: Secondary | ICD-10-CM | POA: Diagnosis not present

## 2024-06-11 DIAGNOSIS — E1165 Type 2 diabetes mellitus with hyperglycemia: Secondary | ICD-10-CM | POA: Diagnosis not present

## 2024-06-11 DIAGNOSIS — E039 Hypothyroidism, unspecified: Secondary | ICD-10-CM | POA: Diagnosis not present

## 2024-07-18 DIAGNOSIS — E1165 Type 2 diabetes mellitus with hyperglycemia: Secondary | ICD-10-CM | POA: Diagnosis not present

## 2024-07-18 DIAGNOSIS — E782 Mixed hyperlipidemia: Secondary | ICD-10-CM | POA: Diagnosis not present

## 2024-07-18 DIAGNOSIS — E039 Hypothyroidism, unspecified: Secondary | ICD-10-CM | POA: Diagnosis not present

## 2024-07-18 DIAGNOSIS — E559 Vitamin D deficiency, unspecified: Secondary | ICD-10-CM | POA: Diagnosis not present

## 2024-07-18 DIAGNOSIS — E538 Deficiency of other specified B group vitamins: Secondary | ICD-10-CM | POA: Diagnosis not present

## 2024-07-24 DIAGNOSIS — M1990 Unspecified osteoarthritis, unspecified site: Secondary | ICD-10-CM | POA: Diagnosis not present

## 2024-07-24 DIAGNOSIS — R911 Solitary pulmonary nodule: Secondary | ICD-10-CM | POA: Diagnosis not present

## 2024-07-24 DIAGNOSIS — Z23 Encounter for immunization: Secondary | ICD-10-CM | POA: Diagnosis not present

## 2024-07-24 DIAGNOSIS — E1165 Type 2 diabetes mellitus with hyperglycemia: Secondary | ICD-10-CM | POA: Diagnosis not present

## 2024-07-24 DIAGNOSIS — E039 Hypothyroidism, unspecified: Secondary | ICD-10-CM | POA: Diagnosis not present

## 2024-07-24 DIAGNOSIS — K219 Gastro-esophageal reflux disease without esophagitis: Secondary | ICD-10-CM | POA: Diagnosis not present

## 2024-07-24 DIAGNOSIS — I2510 Atherosclerotic heart disease of native coronary artery without angina pectoris: Secondary | ICD-10-CM | POA: Diagnosis not present

## 2024-07-24 DIAGNOSIS — E782 Mixed hyperlipidemia: Secondary | ICD-10-CM | POA: Diagnosis not present

## 2024-07-24 DIAGNOSIS — R1900 Intra-abdominal and pelvic swelling, mass and lump, unspecified site: Secondary | ICD-10-CM | POA: Diagnosis not present

## 2024-07-24 DIAGNOSIS — J449 Chronic obstructive pulmonary disease, unspecified: Secondary | ICD-10-CM | POA: Diagnosis not present

## 2024-07-24 DIAGNOSIS — I10 Essential (primary) hypertension: Secondary | ICD-10-CM | POA: Diagnosis not present

## 2024-08-12 ENCOUNTER — Other Ambulatory Visit: Payer: Self-pay | Admitting: Cardiovascular Disease

## 2024-10-23 NOTE — Progress Notes (Signed)
 CARDIOLOGY CONSULT NOTE       Patient ID: ROSIE TORREZ MRN: [id number] DOB/AGE: Mar 13, 1956 68 y.o.  Admit date: (Not on file) Referring Physician: Shona Primary Physician: Shona Norleen PEDLAR, MD Primary Cardiologist: Charls -> Demetrick Eichenberger F/U  Palpitations   HPI:  68 y.o. referred by Dr Shona for palpitations 02/10/21 . She has been followed in past by [facility]. She has a history of right carotid stenting Known CAD with cath 05/18/19 for NSTEMI showing occluded mid/distal RCA stented with good results and no disease in left system EF 50-55% Last carotid duplex 01/2023  with patent right stent and 1-39 % Left ICA stenosis patent stent in RICA   She is still smoking  I see her son She sees endocrine for hypothyroidism on replacement Her TSH has been suppressed repeatedly over the last year   She has an 6 yo grand daughter adopted living with her with severe mental health issues  Depression bipolar suicidal at times   Stopped smoking 08/2022 Lung cancer CT 03/13/21 suspicious subpleural nodular opacity in posterior apical RUL Consider PET/ CT in 3 months Repeat CT stable 04/04/24  and seeing Dr Brenna in pulmonary    Seen in ED 01/07/22 with dyspnea COVID positive Given Paxlovid She has had vaccines and booster COPD on oxygen  at night 2L's Rx steroids/ nebs CXR NAD just chronic emphysema BNP only 124  Has not smoked using Chantix  No TIA symptoms no angina Compliant with meds   No angina has cats/dogs at home but doesn't travel much   On 3L's oxygen  at home admitted to hospital 12/22/23 for COPD exacerbation Quit smoking 09/2023 Positive for influenze Rx with steroids and oseltamivir    She has been having some ? Dysphagia or inability to swallow Also some issues with feeling head rush then weakness These episodes not related to dyspnea, chest pain palpitations, standing She feels week after them I told her I was not sure what the latter symptoms were but not likely to be heart. She should see primary/GI and  have swallowing study/EGD  Seen in ED 06/05/24 for chest pain left-sided chest pain.  Discomfort has been persistent throughout the day.  It did improve slightly with SL NTG.  She took 2 of these.  Currently, she takes one 81 mg aspirin  daily.  She did take a dose this morning.  Pain has at times radiated to left jaw and thoracic back.  She tried Rolaids without relief. R/O no acute ECG changes CTA chest no acute aortic syndrome   Having lightheaded/pre syncopal spells. Oxygen  sats and BP ok when she checks can be associated with rapid heart rates 115-120. She is active at gym and pulse usually not that high. Can feel urge to urinate after spells suggesting possible tachyarrhythmia   ROS All other systems reviewed and negative except as noted above  Past Medical History:  Diagnosis Date   Anxiety    Arthritis    Asthma    Cataracts, bilateral    Roz   Complication of anesthesia    Note: pt has hx of TMJ  every now and then my jaw will lock    COPD (chronic obstructive pulmonary disease) (HCC)    Coronary artery disease    a. s/p DES to mid-RCA in 05/2019   DM (diabetes mellitus) (HCC)    came off medicines with dr's permission   Family history of adverse reaction to anesthesia     my son woke up crying    Fatty liver  Fibromyalgia    GERD (gastroesophageal reflux disease)    Hyperlipidemia    stopped meds on her own   Hypothyroid    Myocardial infarction (HCC)    On supplemental oxygen  therapy    at night and PRN   Pneumonia    Shortness of breath    Stenosis of right carotid artery    TMJ (dislocation of temporomandibular joint)     Family History  Problem Relation Age of Onset   CAD Father    Anesthesia problems Neg Hx    Hypotension Neg Hx    Malignant hyperthermia Neg Hx    Pseudochol deficiency Neg Hx     Social History   Socioeconomic History   Marital status: Single    Spouse name: Not on file   Number of children: Not on file   Years of  education: Not on file   Highest education level: Not on file  Occupational History   Not on file  Tobacco Use   Smoking status: Former    Current packs/day: 0.00    Average packs/day: 0.8 packs/day for 55.0 years (41.3 ttl pk-yrs)    Types: Cigarettes, E-cigarettes    Start date: 09/1967    Quit date: 08/2022    Years since quitting: 2.1   Smokeless tobacco: Never  Vaping Use   Vaping status: Never Used  Substance and Sexual Activity   Alcohol use: No   Drug use: No    Types: Cocaine   Sexual activity: Yes    Birth control/protection: Surgical  Other Topics Concern   Not on file  Social History Narrative   Not on file   Social Drivers of Health   Tobacco Use: Medium Risk (11/04/2024)   Patient History    Smoking Tobacco Use: Former    Smokeless Tobacco Use: Never    Passive Exposure: Not on Actuary Strain: Not on file  Food Insecurity: No Food Insecurity (12/22/2023)   Hunger Vital Sign    Worried About Running Out of Food in the Last Year: Never true    Ran Out of Food in the Last Year: Never true  Transportation Needs: No Transportation Needs (12/22/2023)   PRAPARE - Administrator, Civil Service (Medical): No    Lack of Transportation (Non-Medical): No  Physical Activity: Not on file  Stress: Not on file  Social Connections: Unknown (12/22/2023)   Social Connection and Isolation Panel    Frequency of Communication with Friends and Family: Twice a week    Frequency of Social Gatherings with Friends and Family: Twice a week    Attends Religious Services: Never    Database Administrator or Organizations: Yes    Attends Banker Meetings: Never    Marital Status: Patient declined  Catering Manager Violence: Not At Risk (12/22/2023)   Humiliation, Afraid, Rape, and Kick questionnaire    Fear of Current or Ex-Partner: No    Emotionally Abused: No    Physically Abused: No    Sexually Abused: No  Depression (PHQ2-9): Not on file   Alcohol Screen: Not on file  Housing: Low Risk (12/22/2023)   Housing Stability Vital Sign    Unable to Pay for Housing in the Last Year: No    Number of Times Moved in the Last Year: 0    Homeless in the Last Year: No  Utilities: Not At Risk (12/22/2023)   AHC Utilities    Threatened with loss of utilities: No  Health Literacy: Not on file    Past Surgical History:  Procedure Laterality Date   ABDOMINAL HYSTERECTOMY     BREAST SURGERY     removal of bilateral benign tumors   CARDIAC CATHETERIZATION     2010   CHOLECYSTECTOMY     CORONARY STENT INTERVENTION N/A 05/18/2019   Procedure: CORONARY STENT INTERVENTION;  Surgeon: Court Dorn PARAS, MD;  Location: [facility];  Service: Cardiovascular;  Laterality: N/A;   DILATION AND CURETTAGE OF UTERUS     ESOPHAGOGASTRODUODENOSCOPY  01/26/2012   Procedure: ESOPHAGOGASTRODUODENOSCOPY (EGD);  Surgeon: Claudis RAYMOND Rivet, MD;  Location: [facility];  Service: Endoscopy;  Laterality: N/A;  730   LEFT HEART CATH AND CORONARY ANGIOGRAPHY N/A 05/18/2019   Procedure: LEFT HEART CATH AND CORONARY ANGIOGRAPHY;  Surgeon: Court Dorn PARAS, MD;  Location: [facility];  Service: Cardiovascular;  Laterality: N/A;   TRANSCAROTID ARTERY REVASCULARIZATION  Right 08/02/2019   Procedure: TRANSCAROTID ARTERY REVASCULARIZATION RIGHT;  Surgeon: Serene Gaile ORN, MD;  Location: [facility] OR;  Service: Vascular;  Laterality: Right;   TUBAL LIGATION        Current Outpatient Medications:    aspirin  EC 81 MG EC tablet, Take 1 tablet (81 mg total) by mouth daily., Disp: 30 tablet, Rfl: 3   atorvastatin  (LIPITOR ) 80 MG tablet, TAKE ONE TABLET BY MOUTH AT 6PM. Strength: 80 mg, Disp: 90 tablet, Rfl: 2   BREZTRI AEROSPHERE 160-9-4.8 MCG/ACT AERO, Inhale 2 puffs into the lungs 2 (two) times daily., Disp: , Rfl:    Cholecalciferol (VITAMIN D3) 1.25 MG (50000 UT) CAPS, Take 50,000 Units by mouth every Wednesday., Disp: , Rfl:    Dihydroxyaluminum Sod Carb (ROLAIDS  PO), Take 2 tablets by mouth as needed (acid reflux, heartburn)., Disp: , Rfl:    hydrochlorothiazide (HYDRODIURIL) 12.5 MG tablet, Take 12.5 mg by mouth daily., Disp: , Rfl:    ipratropium-albuterol  (DUONEB) 0.5-2.5 (3) MG/3ML SOLN, Take 3 mLs by nebulization every 4 (four) hours as needed (Wheezing/SOB)., Disp: , Rfl:    levocetirizine (XYZAL) 5 MG tablet, Take 5 mg by mouth daily., Disp: , Rfl:    LORazepam  (ATIVAN ) 1 MG tablet, Take 1 tablet by mouth 4 (four) times daily as needed for anxiety., Disp: , Rfl:    metoprolol  tartrate (LOPRESSOR ) 50 MG tablet, Take 1 tablet (50 mg total) by mouth 2 (two) times daily., Disp: 180 tablet, Rfl: 3   Naltrexone HCl, Pain, (NALTREX) 4.5 MG CAPS, Take 4.5 mg by mouth daily., Disp: , Rfl:    nitroGLYCERIN  (NITROSTAT ) 0.4 MG SL tablet, Place 1 tablet (0.4 mg total) under the tongue every 5 (five) minutes as needed for chest pain., Disp: 25 tablet, Rfl: 3   OREGANO PO, Take 1 capsule by mouth daily., Disp: , Rfl:    potassium chloride  (KLOR-CON ) 10 MEQ tablet, Take 1 tablet (10 mEq total) by mouth daily., Disp: 90 tablet, Rfl: 1   PROAIR  HFA 108 (90 Base) MCG/ACT inhaler, Inhale 1 puff into the lungs every 6 (six) hours as needed for wheezing or shortness of breath. , Disp: , Rfl:    thyroid  (ARMOUR) 15 MG tablet, Take 15 mg by mouth daily., Disp: , Rfl:    thyroid  (ARMOUR) 90 MG tablet, Take 90 mg by mouth daily., Disp: , Rfl:    varenicline  (CHANTIX ) 1 MG tablet, Take 1 mg by mouth daily., Disp: , Rfl:    docusate sodium  (COLACE) 100 MG capsule, Take 200 mg by mouth daily as needed  for mild constipation. (Patient not taking: Reported on 11/04/2024), Disp: , Rfl:    RESTASIS 0.05 % ophthalmic emulsion, Place 1 drop into both eyes 2 (two) times daily as needed (Dry eye). (Patient not taking: Reported on 11/04/2024), Disp: , Rfl:     Physical Exam: Blood pressure 104/62, pulse 67, height 5' 7 (1.702 m), weight 182 lb (82.6 kg), SpO2 97%.    Affect  appropriate Healthy:  appears stated age HEENT: normal Neck supple with no adenopathy JVP normal no bruits no thyromegaly Lungs clear with no wheezing and good diaphragmatic motion Heart:  S1/S2 no murmur, no rub, gallop or click PMI normal Abdomen: benighn, BS positve, no tenderness, no AAA no bruit.  No HSM or HJR Distal pulses intact with no bruits No edema Neuro non-focal Skin warm and dry No muscular weakness   Labs:   Lab Results  Component Value Date   WBC 9.0 06/05/2024   HGB 13.5 06/05/2024   HCT 42.0 06/05/2024   MCV 93.5 06/05/2024   PLT 287 06/05/2024   No results for input(s): NA, K, CL, CO2, BUN, CREATININE, CALCIUM , PROT, BILITOT, ALKPHOS, ALT, AST, GLUCOSE in the last 168 hours.  Invalid input(s): LABALBU No results found for: CKTOTAL, CKMB, CKMBINDEX, TROPONINI  Lab Results  Component Value Date   CHOL 181 05/14/2019   Lab Results  Component Value Date   HDL 35 (L) 05/14/2019   Lab Results  Component Value Date   LDLCALC 113 (H) 05/14/2019   Lab Results  Component Value Date   TRIG 167 (H) 05/14/2019   Lab Results  Component Value Date   CHOLHDL 5.2 05/14/2019   No results found for: LDLDIRECT    Radiology: No results found.  EKG: 11/04/2024 SR rate 67 normal    ASSESSMENT AND PLAN:   1. CAD:  Stenting of occluded mid/distal RCA July 2020 Continue ASA/statin/beta blocker No residual left sided disease Seen in ED 05/2024 with chest pain R/O Update stress testing with lexiscan  myovue 2. HLD on statin labs with primary  3. Smoking:  Hospitalization for Influenza. ON 3L's oxygen  at baseline.  F/U with Dr Brenna in pulmonary CT 06/05/2024 with new 4 mm nodule in LLL 4. PVD: post stenting right ICA Duplex 01/23/23 patent with no obstructive ICS dx bilaterally F/U Dr Serene Duplex 02/19/24 patent stent and 1-39% left ICA stenosis.  5. Thyroid :  TSH with primary has been suppressed no recent labs in Epic 6.  Palpitations: benign monitor 05/12/22 max 15 beats artrial tachycardia and < 1% PAC/PVCls continue lopressor   7. Dysphagia: f/u primary ? Swallowing study or EGD  8. Pre syncope:  see above regarding checking cors. Needs 30 day monitor as well to r/o arrhythmia   Carotid duplex April 2026 Lexiscan  Myovue  F/U in a year    Signed: Maude Emmer 11/04/2024, 10:08 AM

## 2024-10-31 ENCOUNTER — Other Ambulatory Visit: Payer: Self-pay | Admitting: Cardiovascular Disease

## 2024-11-04 ENCOUNTER — Encounter: Payer: Self-pay | Admitting: Cardiovascular Disease

## 2024-11-04 ENCOUNTER — Ambulatory Visit: Attending: Cardiovascular Disease | Admitting: Cardiovascular Disease

## 2024-11-04 VITALS — BP 104/62 | HR 67 | Ht 67.0 in | Wt 182.0 lb

## 2024-11-04 DIAGNOSIS — R55 Syncope and collapse: Secondary | ICD-10-CM | POA: Diagnosis not present

## 2024-11-04 DIAGNOSIS — I739 Peripheral vascular disease, unspecified: Secondary | ICD-10-CM

## 2024-11-04 DIAGNOSIS — R079 Chest pain, unspecified: Secondary | ICD-10-CM

## 2024-11-04 DIAGNOSIS — R002 Palpitations: Secondary | ICD-10-CM

## 2024-11-04 DIAGNOSIS — I2510 Atherosclerotic heart disease of native coronary artery without angina pectoris: Secondary | ICD-10-CM | POA: Diagnosis not present

## 2024-11-04 NOTE — Patient Instructions (Addendum)
 Medication Instructions:  Your physician recommends that you continue on your current medications as directed. Please refer to the Current Medication list given to you today.  *If you need a refill on your cardiac medications before your next appointment, please call your pharmacy*  Lab Work: None today  If you have labs (blood work) drawn today and your tests are completely normal, you will receive your results only by: MyChart Message (if you have MyChart) OR A paper copy in the mail If you have any lab test that is abnormal or we need to change your treatment, we will call you to review the results.  Testing/Procedures: Your physician has requested that you have a lexiscan  myoview . For further information please visit [email or web address] Please follow instruction sheet, as given.   Follow-Up: At [facility], you and your health needs are our priority.  As part of our continuing mission to provide you with exceptional heart care, our providers are all part of one team.  This team includes your primary Cardiologist (physician) and Advanced Practice Providers or APPs (Physician Assistants and Nurse Practitioners) who all work together to provide you with the care you need, when you need it.  Your next appointment:   3 month(s)  Provider:   You may see Maude Emmer, MD or one of the following Advanced Practice Providers on your designated Care Team:   Laymon Qua, PA-C  [place], [place] Olivia Pavy, [place]   We recommend signing up for the patient portal called MyChart.  Sign up information is provided on this After Visit Summary.  MyChart is used to connect with patients for Virtual Visits (Telemedicine).  Patients are able to view lab/test results, encounter notes, upcoming appointments, etc.  Non-urgent messages can be sent to your provider as well.   To learn more about what you can do with MyChart, go to [email or web address].   Other Instructions Your  physician has recommended that you wear an event monitor. Event monitors are medical devices that record the hearts electrical activity. Doctors most often us  these monitors to diagnose arrhythmias. Arrhythmias are problems with the speed or rhythm of the heartbeat. The monitor is a small, portable device. You can wear one while you do your normal daily activities. This is usually used to diagnose what is causing palpitations/syncope (passing out).

## 2024-11-11 ENCOUNTER — Inpatient Hospital Stay (HOSPITAL_COMMUNITY): Admission: RE | Admit: 2024-11-11 | Source: Ambulatory Visit

## 2024-11-11 ENCOUNTER — Other Ambulatory Visit (HOSPITAL_COMMUNITY)

## 2024-11-14 ENCOUNTER — Ambulatory Visit: Attending: Cardiovascular Disease

## 2024-11-14 DIAGNOSIS — R002 Palpitations: Secondary | ICD-10-CM

## 2024-11-15 ENCOUNTER — Telehealth (HOSPITAL_COMMUNITY): Payer: Self-pay

## 2024-11-15 NOTE — Telephone Encounter (Signed)
 Attempted to call patient to remind of stress test Monday morning. No answer, left a voicemail regarding what time to arrive and all instructions. Instructed to call back with any questions.

## 2024-11-18 ENCOUNTER — Ambulatory Visit (HOSPITAL_COMMUNITY)
Admission: RE | Admit: 2024-11-18 | Discharge: 2024-11-18 | Disposition: A | Discharge status: Home / Self Care | Source: Ambulatory Visit | Attending: Internal Medicine | Admitting: Internal Medicine

## 2024-11-18 ENCOUNTER — Encounter (HOSPITAL_COMMUNITY)
Admission: RE | Admit: 2024-11-18 | Discharge: 2024-11-18 | Disposition: A | Discharge status: Home / Self Care | Source: Ambulatory Visit | Attending: Cardiovascular Disease | Admitting: Cardiovascular Disease

## 2024-11-18 ENCOUNTER — Other Ambulatory Visit: Payer: Self-pay | Admitting: Physician Assistant

## 2024-11-18 ENCOUNTER — Ambulatory Visit: Payer: Self-pay | Admitting: Cardiovascular Disease

## 2024-11-18 DIAGNOSIS — R079 Chest pain, unspecified: Secondary | ICD-10-CM

## 2024-11-18 DIAGNOSIS — I2510 Atherosclerotic heart disease of native coronary artery without angina pectoris: Secondary | ICD-10-CM | POA: Insufficient documentation

## 2024-11-18 LAB — NM MYOCAR MULTI W/SPECT W/WALL MOTION / EF
Base ST Depression (mm): 0 mm
LV dias vol: 48 mL (ref 46–106)
LV sys vol: 14 mL
MPHR: 150 {beats}/min
Nuc Stress EF: 70 %
Peak HR: 112 {beats}/min
Percent HR: 74 %
RATE: 0.4
Rest HR: 78 {beats}/min
Rest Nuclear Isotope Dose: 10.2 mCi
SDS: 8
SRS: 7
SSS: 15
ST Depression (mm): 0 mm
Stress Nuclear Isotope Dose: 31.8 mCi
TID: 1.2

## 2024-11-18 MED ORDER — REGADENOSON 0.4 MG/5ML IV SOLN
INTRAVENOUS | Status: AC
  Administered 2024-11-18: 0.4 mg via INTRAVENOUS
  Filled 2024-11-18: qty 5

## 2024-11-18 MED ORDER — TECHNETIUM TC 99M TETROFOSMIN IV KIT
31.8000 | PACK | Freq: Once | INTRAVENOUS | Status: AC | PRN
  Administered 2024-11-18: 31.8 via INTRAVENOUS

## 2024-11-18 MED ORDER — TECHNETIUM TC 99M TETROFOSMIN IV KIT
10.2000 | PACK | Freq: Once | INTRAVENOUS | Status: AC | PRN
  Administered 2024-11-18: 10.2 via INTRAVENOUS

## 2024-11-18 MED ORDER — SODIUM CHLORIDE FLUSH 0.9 % IV SOLN
INTRAVENOUS | Status: AC
  Administered 2024-11-18: 10 mL via INTRAVENOUS
  Filled 2024-11-18: qty 10

## 2024-11-18 NOTE — Progress Notes (Signed)
" °  ° °  Tabitha Schmidt presented for a Lexiscan  nuclear stress test today.  I Lorette CINDERELLA Kapur, PA-C, provided direct supervision and was present during the stress portion of the study today, which was completed without significant symptoms, immediate complications, or acute ST/T changes on ECG.  Stress imaging is pending at this time.  Preliminary ECG findings may be listed in the chart, but the stress test result will not be finalized until perfusion imaging is complete.  Lorette CINDERELLA Kapur, PA-C  11/18/2024, 10:46 AM    "

## 2024-11-20 NOTE — H&P (View-Only) (Signed)
 "  Cardiology Office Note    Date:  11/21/2024  ID:  Zhanae, Proffit [birth year], MRN [id number] Cardiologist: Maude Emmer, MD {  History of Present Illness:    Tabitha Schmidt is a 69 y.o. female with past medical history of CAD (s/p NSTEMI in 05/2019 with cardiac catheterization showing occluded mid/distal RCA and treated with DESx1), carotid artery stenosis (s/p right TCAR in 08/2019), HLD, palpitations, COPD and hypothyroidism who presents to the office today to discuss results of her recent abnormal stress test.  She was examined by Dr. Emmer earlier this month and had previously been in the ED in 05/2024 for left-sided chest pain and had tried Rolaids without relief but symptoms did improve with nitroglycerin . At the time of her follow-up visit, she reported having episodes of lightheadedness and presyncope with heart rate in the 110's to 120's at times. Given recent episodes of chest pain, a Lexiscan  Myoview  was recommended for further assessment along with a 30-day monitor to rule out significant arrhythmias given her presyncope and occasional palpitations.  Her stress test was performed on 11/18/2024 and showed a medium sized, reversible perfusion defect along the entire inferior wall which was consistent with ischemia. EF was normal and the study was overall intermediate-risk. Dr. Emmer recommended close follow-up to arrange for a cardiac catheterization.  In talking with the patient today, she reports having episodes of dizziness and flushing which occur sporadically. She has noticed these can occur at rest or with activity and unaware of any specific triggers. Does report balance issues when the episodes occur and they typically last for less than a minute and then resolve. She did have chest pain in 05/2024 and reports occasional discomfort around the time of the episodes as discussed above but no persistent symptoms. Breathing has been stable and no orthopnea, PND or pitting  edema.  Studies Reviewed:   EKG: EKG is not ordered today. EKG from 11/18/2024 is reviewed and shows NSR, HR 79 with no acute ST changes.         NST: 10/2024   No ST deviation was noted. Pharmacological protocol is used.   LV perfusion is abnormal. There is evidence of ischemia. There is no evidence of infarction. There is a medium sized reversible perfusion defect with severe reduction in uptake present in the entire inferior wall with normal wall motion in the defect area consistent with ischemia.   Left ventricular function is normal. Nuclear stress EF: 70%.   Findings are consistent with ischemia and no infarction. The study is intermediate risk.   Physical Exam:   VS:  BP 136/70 (BP Location: Left Arm, Cuff Size: Normal)   Pulse 96   Ht 5' 7 (1.702 m)   Wt 181 lb 6.4 oz (82.3 kg)   SpO2 97%   BMI 28.41 kg/m    Wt Readings from Last 3 Encounters:  11/21/24 181 lb 6.4 oz (82.3 kg)  11/04/24 182 lb (82.6 kg)  06/05/24 205 lb (93 kg)     GEN: Well nourished, well developed female appearing in no acute distress NECK: No JVD; No carotid bruits CARDIAC: RRR, no murmurs, rubs, gallops RESPIRATORY:  Clear to auscultation without rales, wheezing or rhonchi  ABDOMEN: Appears non-distended. No obvious abdominal masses. EXTREMITIES: No clubbing or cyanosis. No pitting edema.  Distal pedal pulses are 2+ bilaterally.   Assessment and Plan:   1. Coronary artery disease involving native coronary artery of native heart with other form of angina pectoris - She previously  underwent DES placement to the mid-distal RCA in 05/2019. Recent stress test was abnormal as this showed a medium-sized perfusion defect along the entire inferior wall which was consistent with ischemia and Dr. Delford recommended a cardiac catheterization for definitive evaluation. The procedure along with risks and benefits were reviewed with the patient today and she agrees to proceed. Will recheck a CBC and BMET  prior to her procedure.  - Continue ASA 81 mg daily, Atorvastatin  80 mg daily and Lopressor  50 mg twice daily.  2. Hyperlipidemia LDL goal <70 - By review of [facility] DXA, FLP in 07/2024 showed total cholesterol 135, triglycerides 100, HDL 44 and LDL 72. Continue Atorvastatin  80 mg daily for now. If LDL remains above goal, would add Zetia.  3. Stenosis of right carotid artery - Previously underwent right TCAR in 08/2019. Dopplers in 01/2024 showed a patent RICA stent with 1 to 39% LICA stenosis. Followed by Vascular Surgery. Continue ASA 81 mg daily and Atorvastatin  80 mg daily.  4. Palpitations - She does report episodes of dizziness and flushing as discussed above and currently has a monitor in place. Will follow-up on results once available. Remains on Lopressor  50 mg twice daily.  Informed Consent   Shared Decision Making/Informed Consent The risks [stroke (1 in 1000), death (1 in 1000), kidney failure [usually temporary] (1 in 500), bleeding (1 in 200), allergic reaction [possibly serious] (1 in 200)], benefits (diagnostic support and management of coronary artery disease) and alternatives of a cardiac catheterization were discussed in detail with Ms. Rishel and she is willing to proceed.     Signed, Laymon CHRISTELLA Qua, PA-C   "

## 2024-11-20 NOTE — Progress Notes (Unsigned)
 "  Cardiology Office Note    Date:  11/21/2024  ID:  Tabitha, Schmidt 1956/05/18, MRN [id number] Cardiologist: Maude Emmer, MD { :  History of Present Illness:    Tabitha Schmidt is a 69 y.o. female with past medical history of CAD (s/p NSTEMI in 05/2019 with cardiac catheterization showing occluded mid/distal RCA and treated with DESx1), carotid artery stenosis (s/p right TCAR in 08/2019), palpitations, COPD and hypothyroidism who presents to the office today to discuss results of her recent abnormal stress test.  She was last examined by Dr. Emmer earlier this month.  Previously been in the ED in 05/2024 for left-sided chest pain and had tried Rolaids without relief but symptoms did improve with nitroglycerin .  At the time of her follow-up visit, she reported having episodes of lightheadedness and presyncope with heart rate in the 110s to 120s at times.  Given recent episodes of chest pain, a Lexiscan  Myoview  was recommended for further assessment along with a 30-day monitor to rule out significant arrhythmias given her presyncope and occasional palpitations.  Her stress test was performed on 11/18/2024 and showed a medium sized, reversible perfusion defect along the entire inferior wall which was consistent with ischemia.  EF was normal and the study was overall intermediate risk. Dr. Emmer recommended close follow-up to arrange for a cardiac catheterization.  In talking with the patient today, she reports having episodes of dizziness and flushing which occur sporadically.  She has noticed these can occur at rest or with activity and unaware of any specific triggers.  Does report balance issues when the episodes occur and they last for typically less than a minute and then resolved.  She did have chest pain in 05/2024 and reports occasional discomfort around the time of the episodes as discussed above but no persistent symptoms.  Breathing has been stable and no orthopnea, PND or pitting  edema.  Studies Reviewed:   EKG: EKG is not ordered today. EKG from 11/18/2024 is reviewed and shows NSR, HR 79 with no acute ST changes.         NST: 10/2024   No ST deviation was noted. Pharmacological protocol is used.   LV perfusion is abnormal. There is evidence of ischemia. There is no evidence of infarction. There is a medium sized reversible perfusion defect with severe reduction in uptake present in the entire inferior wall with normal wall motion in the defect area consistent with ischemia.   Left ventricular function is normal. Nuclear stress EF: 70%.   Findings are consistent with ischemia and no infarction. The study is intermediate risk.   Physical Exam:   VS:  BP 136/70 (BP Location: Left Arm, Cuff Size: Normal)   Pulse 96   Ht 5' 7 (1.702 m)   Wt 181 lb 6.4 oz (82.3 kg)   SpO2 97%   BMI 28.41 kg/m    Wt Readings from Last 3 Encounters:  11/21/24 181 lb 6.4 oz (82.3 kg)  11/04/24 182 lb (82.6 kg)  06/05/24 205 lb (93 kg)     GEN: Well nourished, well developed female appearing in no acute distress NECK: No JVD; No carotid bruits CARDIAC: RRR, no murmurs, rubs, gallops RESPIRATORY:  Clear to auscultation without rales, wheezing or rhonchi  ABDOMEN: Appears non-distended. No obvious abdominal masses. EXTREMITIES: No clubbing or cyanosis. No pitting edema.  Distal pedal pulses are 2+ bilaterally.   Assessment and Plan:      {Are you ordering a CV Procedure (e.g. stress test,  cath, DCCV, TEE, etc)?   Press F2        :[id number]}   Signed, Laymon CHRISTELLA Qua, PA-C   "

## 2024-11-21 ENCOUNTER — Ambulatory Visit: Attending: Student | Admitting: Student

## 2024-11-21 VITALS — BP 136/70 | HR 96 | Ht 67.0 in | Wt 181.4 lb

## 2024-11-21 DIAGNOSIS — I25118 Atherosclerotic heart disease of native coronary artery with other forms of angina pectoris: Secondary | ICD-10-CM | POA: Diagnosis not present

## 2024-11-21 DIAGNOSIS — R002 Palpitations: Secondary | ICD-10-CM | POA: Diagnosis not present

## 2024-11-21 DIAGNOSIS — Z01818 Encounter for other preprocedural examination: Secondary | ICD-10-CM

## 2024-11-21 DIAGNOSIS — Z01810 Encounter for preprocedural cardiovascular examination: Secondary | ICD-10-CM

## 2024-11-21 DIAGNOSIS — I6521 Occlusion and stenosis of right carotid artery: Secondary | ICD-10-CM | POA: Diagnosis not present

## 2024-11-21 DIAGNOSIS — E785 Hyperlipidemia, unspecified: Secondary | ICD-10-CM

## 2024-11-21 MED ORDER — OZEMPIC (0.25 OR 0.5 MG/DOSE) 2 MG/1.5ML ~~LOC~~ SOPN: 0.5000 mg | PEN_INJECTOR | SUBCUTANEOUS | Status: DC

## 2024-11-21 NOTE — Patient Instructions (Signed)
 Medication Instructions:   Hold hydrochlorothiazide and Naltex the morning of your procedure   *If you need a refill on your cardiac medications before your next appointment, please call your pharmacy*  Lab Work: Your physician recommends that you return for lab work prior to your procedure.   If you have labs (blood work) drawn today and your tests are completely normal, you will receive your results only by: MyChart Message (if you have MyChart) OR A paper copy in the mail If you have any lab test that is abnormal or we need to change your treatment, we will call you to review the results.  Testing/Procedures: Your physician has requested that you have a cardiac catheterization. Cardiac catheterization is used to diagnose and/or treat various heart conditions. Doctors may recommend this procedure for a number of different reasons. The most common reason is to evaluate chest pain. Chest pain can be a symptom of coronary artery disease (CAD), and cardiac catheterization can show whether plaque is narrowing or blocking your hearts arteries. This procedure is also used to evaluate the valves, as well as measure the blood flow and oxygen  levels in different parts of your heart. For further information please visit [email or web address] Please follow instruction sheet, as given.   Follow-Up: At [facility], you and your health needs are our priority.  As part of our continuing mission to provide you with exceptional heart care, our providers are all part of one team.  This team includes your primary Cardiologist (physician) and Advanced Practice Providers or APPs (Physician Assistants and Nurse Practitioners) who all work together to provide you with the care you need, when you need it.  Your next appointment:   1 month(s) after your procedure  Provider:   You may see Maude Emmer, MD or one of the following Advanced Practice Providers on your designated Care Team:   Laymon Qua,  PA-C  [place], [place] Olivia Pavy, [place]     We recommend signing up for the patient portal called MyChart.  Sign up information is provided on this After Visit Summary.  MyChart is used to connect with patients for Virtual Visits (Telemedicine).  Patients are able to view lab/test results, encounter notes, upcoming appointments, etc.  Non-urgent messages can be sent to your provider as well.   To learn more about what you can do with MyChart, go to [email or web address].   Other Instructions Thank you for choosing [facility]!            [facility] A DEPT OF [facility] [place] [facility] [address]  [place] [address] Dept: [phone] Loc: [phone]  NATASJA NIDAY  11/21/2024  You are scheduled for a Cardiac Catheterization on Monday, February 2 with Dr. Lonni End.  1. Please arrive at the [facility] (Main Entrance A) at [facility]: [address] [place], [place] [address] at 5:30 AM (This time is 2 hour(s) before your procedure to ensure your preparation).   Free valet parking service is available. You will check in at ADMITTING. The support person will be asked to wait in the waiting room.  It is OK to have someone drop you off and come back when you are ready to be discharged.    Special note: Every effort is made to have your procedure done on time. Please understand that emergencies sometimes delay scheduled procedures.  2. Diet: Nothing to eat after midnight.   3. Hydration: You  need to be well hydrated before your procedure. On February 2, you may drink approved liquids (see below) until 2 hours before the procedure, with 16 oz of water  as your last intake.   List of approved liquids water , clear juice, clear tea, black coffee, fruit juices, non-citric and without pulp, carbonated beverages, Gatorade, Kool -Aid, plain Jello-O and plain ice popsicles.  4. Labs: You will need to  have blood drawn prior to your procedure. You do not need to be fasting.  5. Medication instructions in preparation for your procedure:   Contrast Allergy: No  Hold your hydrochlorothiazide and Naltex the morning of your procedure.   On the morning of your procedure, take your Aspirin  81 mg and any morning medicines NOT listed above.  You may use sips of water .  6. Plan to go home the same day, you will only stay overnight if medically necessary. 7. Bring a current list of your medications and current insurance cards. 8. You MUST have a responsible person to drive you home. 9. Someone MUST be with you the first 24 hours after you arrive home or your discharge will be delayed. 10. Please wear clothes that are easy to get on and off and wear slip-on shoes.  Thank you for allowing us  to care for you!   -- [facility]

## 2024-11-22 ENCOUNTER — Encounter: Payer: Self-pay | Admitting: Student

## 2024-11-26 ENCOUNTER — Telehealth: Payer: Self-pay | Admitting: *Deleted

## 2024-11-26 NOTE — Telephone Encounter (Signed)
 Cardiac Catheterization scheduled at [facility] for: Monday December 02, 2024 7:30 AM Arrival time [facility] Main Entrance A at: 5:30 AM  Diet: -Nothing to eat after midnight.  Hydration: -May drink clear liquids until 2 hours before the procedure.  Approved liquids: Water , clear tea, black coffee, fruit juices-non-citric and without pulp,Gatorade, plain Jello/popsicles.   -Please drink 16 oz of water  2 hours before procedure.  Medication instructions: -Hold:  Hydrochlorothiazide/KCl-AM of procedure -Other usual morning medications can be taken including aspirin  81 mg.  Patient tells me Mounjaro is weekly on Saturdays.  Plan to go home the same day, you will only stay overnight if medically necessary.  You must have responsible adult to drive you home.  Someone must be with you the first 24 hours after you arrive home.  Reviewed procedure instructions with patient. Patient will get BMP/CBC 11/27/24.

## 2024-11-28 ENCOUNTER — Ambulatory Visit: Payer: Self-pay | Admitting: Student

## 2024-11-28 LAB — CBC
Hematocrit: 42 % (ref 34.0–46.6)
Hemoglobin: 14.1 g/dL (ref 11.1–15.9)
MCH: 30.6 pg (ref 26.6–33.0)
MCHC: 33.6 g/dL (ref 31.5–35.7)
MCV: 91 fL (ref 79–97)
Platelets: 355 10*3/uL (ref 150–450)
RBC: 4.61 x10E6/uL (ref 3.77–5.28)
RDW: 13.2 % (ref 11.7–15.4)
WBC: 6.9 10*3/uL (ref 3.4–10.8)

## 2024-11-28 LAB — BASIC METABOLIC PANEL WITH GFR
BUN/Creatinine Ratio: 22 (ref 12–28)
BUN: 14 mg/dL (ref 8–27)
CO2: 25 mmol/L (ref 20–29)
Calcium: 10.2 mg/dL (ref 8.7–10.3)
Chloride: 99 mmol/L (ref 96–106)
Creatinine, Ser: 0.64 mg/dL (ref 0.57–1.00)
Glucose: 102 mg/dL — ABNORMAL HIGH (ref 70–99)
Potassium: 4.6 mmol/L (ref 3.5–5.2)
Sodium: 141 mmol/L (ref 134–144)
eGFR: 95 mL/min/{1.73_m2}

## 2024-12-01 NOTE — Pre-Procedure Instructions (Signed)
 Spoke to patient regarding procedure for tomorrow. She wishes to reschedule.  She said she could get her self there but her ride home canceled.  We have moved her procedure to Tuesday 2/3 @ 1200.  She verbalized understanding.

## 2024-12-02 ENCOUNTER — Telehealth: Payer: Self-pay | Admitting: *Deleted

## 2024-12-03 ENCOUNTER — Encounter (HOSPITAL_COMMUNITY): Payer: Self-pay | Admitting: Cardiology

## 2024-12-03 ENCOUNTER — Ambulatory Visit (HOSPITAL_COMMUNITY)
Admission: RE | Admit: 2024-12-03 | Discharge: 2024-12-03 | Disposition: A | Attending: Cardiology | Admitting: Cardiology

## 2024-12-03 ENCOUNTER — Other Ambulatory Visit: Payer: Self-pay

## 2024-12-03 ENCOUNTER — Encounter (HOSPITAL_COMMUNITY): Admission: RE | Disposition: A | Payer: Self-pay | Source: Home / Self Care | Attending: Cardiology

## 2024-12-03 DIAGNOSIS — I252 Old myocardial infarction: Secondary | ICD-10-CM | POA: Insufficient documentation

## 2024-12-03 DIAGNOSIS — I25118 Atherosclerotic heart disease of native coronary artery with other forms of angina pectoris: Secondary | ICD-10-CM | POA: Insufficient documentation

## 2024-12-03 DIAGNOSIS — Z79899 Other long term (current) drug therapy: Secondary | ICD-10-CM | POA: Insufficient documentation

## 2024-12-03 DIAGNOSIS — E039 Hypothyroidism, unspecified: Secondary | ICD-10-CM | POA: Insufficient documentation

## 2024-12-03 DIAGNOSIS — E785 Hyperlipidemia, unspecified: Secondary | ICD-10-CM | POA: Insufficient documentation

## 2024-12-03 DIAGNOSIS — R002 Palpitations: Secondary | ICD-10-CM | POA: Insufficient documentation

## 2024-12-03 DIAGNOSIS — Z7982 Long term (current) use of aspirin: Secondary | ICD-10-CM | POA: Insufficient documentation

## 2024-12-03 DIAGNOSIS — J449 Chronic obstructive pulmonary disease, unspecified: Secondary | ICD-10-CM | POA: Insufficient documentation

## 2024-12-03 DIAGNOSIS — Z955 Presence of coronary angioplasty implant and graft: Secondary | ICD-10-CM | POA: Insufficient documentation

## 2024-12-03 DIAGNOSIS — I6521 Occlusion and stenosis of right carotid artery: Secondary | ICD-10-CM | POA: Insufficient documentation

## 2024-12-03 MED ORDER — ACETAMINOPHEN 325 MG PO TABS: 650.0000 mg | ORAL_TABLET | ORAL | Status: DC | PRN

## 2024-12-03 MED ORDER — MIDAZOLAM HCL 2 MG/2ML IJ SOLN
INTRAMUSCULAR | Status: AC
  Filled 2024-12-03: qty 2

## 2024-12-03 MED ORDER — MIDAZOLAM HCL (PF) 2 MG/2ML IJ SOLN
INTRAMUSCULAR | Status: DC | PRN
  Administered 2024-12-03: 2 mg via INTRAVENOUS

## 2024-12-03 MED ORDER — LIDOCAINE HCL (PF) 1 % IJ SOLN
INTRAMUSCULAR | Status: DC | PRN
  Administered 2024-12-03: 2 mL

## 2024-12-03 MED ORDER — HEPARIN (PORCINE) IN NACL 1000-0.9 UT/500ML-% IV SOLN
INTRAVENOUS | Status: DC | PRN
  Administered 2024-12-03: 1000 mL

## 2024-12-03 MED ORDER — VERAPAMIL HCL 2.5 MG/ML IV SOLN
INTRAVENOUS | Status: DC | PRN
  Administered 2024-12-03: 10 mL via INTRA_ARTERIAL

## 2024-12-03 MED ORDER — IOHEXOL 350 MG/ML SOLN
INTRAVENOUS | Status: DC | PRN
  Administered 2024-12-03: 20 mL

## 2024-12-03 MED ORDER — FREE WATER: 500.0000 mL | Freq: Once | Status: DC

## 2024-12-03 MED ORDER — SODIUM CHLORIDE 0.9 % IV SOLN: 250.0000 mL | INTRAVENOUS | Status: DC | PRN

## 2024-12-03 MED ORDER — HEPARIN SODIUM (PORCINE) 1000 UNIT/ML IJ SOLN
INTRAMUSCULAR | Status: DC | PRN
  Administered 2024-12-03: 4000 [IU] via INTRAVENOUS

## 2024-12-03 MED ORDER — SODIUM CHLORIDE 0.9% FLUSH: 3.0000 mL | INTRAVENOUS | Status: DC | PRN

## 2024-12-03 MED ORDER — SODIUM CHLORIDE 0.9% FLUSH: 3.0000 mL | Freq: Two times a day (BID) | INTRAVENOUS | Status: DC

## 2024-12-03 MED ORDER — HEPARIN SODIUM (PORCINE) 1000 UNIT/ML IJ SOLN
INTRAMUSCULAR | Status: AC
  Filled 2024-12-03: qty 10

## 2024-12-03 MED ORDER — ASPIRIN 81 MG PO CHEW: 81.0000 mg | CHEWABLE_TABLET | ORAL | Status: DC

## 2024-12-03 MED ORDER — FENTANYL CITRATE (PF) 100 MCG/2ML IJ SOLN
INTRAMUSCULAR | Status: AC
  Filled 2024-12-03: qty 2

## 2024-12-03 MED ORDER — VERAPAMIL HCL 2.5 MG/ML IV SOLN
INTRAVENOUS | Status: AC
  Filled 2024-12-03: qty 2

## 2024-12-03 MED ORDER — ONDANSETRON HCL 4 MG/2ML IJ SOLN: 4.0000 mg | Freq: Four times a day (QID) | INTRAMUSCULAR | Status: DC | PRN

## 2024-12-03 MED ORDER — FENTANYL CITRATE (PF) 100 MCG/2ML IJ SOLN
INTRAMUSCULAR | Status: DC | PRN
  Administered 2024-12-03: 25 ug via INTRAVENOUS

## 2024-12-03 MED ORDER — LIDOCAINE HCL (PF) 1 % IJ SOLN
INTRAMUSCULAR | Status: AC
  Filled 2024-12-03: qty 30

## 2024-12-03 NOTE — Interval H&P Note (Signed)
 History and Physical Interval Note:  12/03/2024 2:43 PM  Tabitha Schmidt  has presented today for surgery, with the diagnosis of abnormal stress test.  The various methods of treatment have been discussed with the patient and family. After consideration of risks, benefits and other options for treatment, the patient has consented to  Procedures: LEFT HEART CATH AND CORONARY ANGIOGRAPHY (N/A) and possible coronary angioplasty for class III angina pectoris in spite of medical therapy and abnormal nuclear stress test as a surgical intervention.  The patient's history has been reviewed, patient examined, no change in status, stable for surgery.  I have reviewed the patient's chart and labs.  Questions were answered to the patient's satisfaction.     Gordy Bergamo

## 2024-12-03 NOTE — Discharge Instructions (Signed)

## 2024-12-03 NOTE — Progress Notes (Signed)
 Pt and son received discharge instructions, teach back performed. Iv removed, no complications. Rt radial site is clean dry intact, site is soft, no signs of bleeding. Pt escorted out via wheelchair to son's vehicle.

## 2024-12-05 ENCOUNTER — Telehealth: Payer: Self-pay

## 2024-12-05 NOTE — Telephone Encounter (Signed)
 Received fax from [employer] stating they had not had a transmission from pt in 2+ days- tried to contact pt w/ no success.  Spoke to pt who stated that she talked to rep from the company and stated that she had already mailed monitor back d/t having a procedure completed.   No other action is needed at this time.

## 2025-01-08 ENCOUNTER — Ambulatory Visit: Admitting: Student

## 2025-01-24 ENCOUNTER — Ambulatory Visit: Admitting: Cardiovascular Disease
